# Patient Record
Sex: Female | Born: 1955
Health system: Southern US, Community
[De-identification: ages and names within clinical notes are randomized; demographics above are authoritative.]

## PROBLEM LIST (undated history)

## (undated) DIAGNOSIS — Z973 Presence of spectacles and contact lenses: Secondary | ICD-10-CM

## (undated) DIAGNOSIS — R232 Flushing: Secondary | ICD-10-CM

## (undated) DIAGNOSIS — R112 Nausea with vomiting, unspecified: Secondary | ICD-10-CM

## (undated) DIAGNOSIS — N926 Irregular menstruation, unspecified: Secondary | ICD-10-CM

## (undated) DIAGNOSIS — Z9221 Personal history of antineoplastic chemotherapy: Secondary | ICD-10-CM

## (undated) DIAGNOSIS — Z9889 Other specified postprocedural states: Secondary | ICD-10-CM

## (undated) DIAGNOSIS — T7840XA Allergy, unspecified, initial encounter: Secondary | ICD-10-CM

## (undated) DIAGNOSIS — C50919 Malignant neoplasm of unspecified site of unspecified female breast: Secondary | ICD-10-CM

## (undated) DIAGNOSIS — C50412 Malignant neoplasm of upper-outer quadrant of left female breast: Secondary | ICD-10-CM

## (undated) DIAGNOSIS — N83209 Unspecified ovarian cyst, unspecified side: Secondary | ICD-10-CM

## (undated) DIAGNOSIS — Z923 Personal history of irradiation: Secondary | ICD-10-CM

## (undated) HISTORY — PX: BREAST LUMPECTOMY: SHX2

## (undated) HISTORY — DX: Unspecified ovarian cyst, unspecified side: N83.209

## (undated) HISTORY — DX: Flushing: R23.2

## (undated) HISTORY — DX: Malignant neoplasm of upper-outer quadrant of left female breast: C50.412

## (undated) HISTORY — PX: DILATION AND CURETTAGE OF UTERUS: SHX78

## (undated) HISTORY — DX: Irregular menstruation, unspecified: N92.6

## (undated) HISTORY — PX: UPPER GI ENDOSCOPY: SHX6162

## (undated) HISTORY — PX: BREAST BIOPSY: SHX20

---

## 1988-10-29 HISTORY — PX: DIAGNOSTIC LAPAROSCOPY: SUR761

## 1997-12-08 ENCOUNTER — Ambulatory Visit (HOSPITAL_COMMUNITY): Admission: RE | Admit: 1997-12-08 | Discharge: 1997-12-08 | Payer: Self-pay | Admitting: Obstetrics and Gynecology

## 1999-08-31 ENCOUNTER — Other Ambulatory Visit: Admission: RE | Admit: 1999-08-31 | Discharge: 1999-08-31 | Payer: Self-pay | Admitting: *Deleted

## 1999-10-02 ENCOUNTER — Ambulatory Visit (HOSPITAL_COMMUNITY): Admission: RE | Admit: 1999-10-02 | Discharge: 1999-10-02 | Payer: Self-pay | Admitting: Obstetrics and Gynecology

## 1999-10-02 ENCOUNTER — Encounter: Payer: Self-pay | Admitting: Obstetrics and Gynecology

## 2000-12-13 ENCOUNTER — Other Ambulatory Visit: Admission: RE | Admit: 2000-12-13 | Discharge: 2000-12-13 | Payer: Self-pay | Admitting: Obstetrics and Gynecology

## 2001-01-03 ENCOUNTER — Ambulatory Visit (HOSPITAL_COMMUNITY): Admission: RE | Admit: 2001-01-03 | Discharge: 2001-01-03 | Payer: Self-pay | Admitting: Obstetrics and Gynecology

## 2001-01-03 ENCOUNTER — Encounter: Payer: Self-pay | Admitting: Obstetrics and Gynecology

## 2001-11-12 ENCOUNTER — Other Ambulatory Visit: Admission: RE | Admit: 2001-11-12 | Discharge: 2001-11-12 | Payer: Self-pay | Admitting: Obstetrics and Gynecology

## 2001-11-19 ENCOUNTER — Encounter: Admission: RE | Admit: 2001-11-19 | Discharge: 2001-11-19 | Payer: Self-pay | Admitting: Internal Medicine

## 2001-11-19 ENCOUNTER — Encounter: Payer: Self-pay | Admitting: Internal Medicine

## 2002-01-15 ENCOUNTER — Ambulatory Visit (HOSPITAL_COMMUNITY): Admission: RE | Admit: 2002-01-15 | Discharge: 2002-01-15 | Payer: Self-pay | Admitting: Obstetrics and Gynecology

## 2002-01-15 ENCOUNTER — Encounter: Payer: Self-pay | Admitting: Obstetrics and Gynecology

## 2003-01-18 ENCOUNTER — Encounter: Payer: Self-pay | Admitting: Obstetrics and Gynecology

## 2003-01-18 ENCOUNTER — Ambulatory Visit (HOSPITAL_COMMUNITY): Admission: RE | Admit: 2003-01-18 | Discharge: 2003-01-18 | Payer: Self-pay | Admitting: Obstetrics and Gynecology

## 2003-02-17 ENCOUNTER — Other Ambulatory Visit: Admission: RE | Admit: 2003-02-17 | Discharge: 2003-02-17 | Payer: Self-pay | Admitting: Obstetrics and Gynecology

## 2004-01-31 ENCOUNTER — Ambulatory Visit (HOSPITAL_COMMUNITY): Admission: RE | Admit: 2004-01-31 | Discharge: 2004-01-31 | Payer: Self-pay | Admitting: Obstetrics and Gynecology

## 2004-02-22 ENCOUNTER — Other Ambulatory Visit: Admission: RE | Admit: 2004-02-22 | Discharge: 2004-02-22 | Payer: Self-pay | Admitting: Obstetrics and Gynecology

## 2004-10-29 DIAGNOSIS — Z9889 Other specified postprocedural states: Secondary | ICD-10-CM

## 2004-10-29 DIAGNOSIS — R112 Nausea with vomiting, unspecified: Secondary | ICD-10-CM

## 2004-10-29 HISTORY — DX: Other specified postprocedural states: R11.2

## 2004-10-29 HISTORY — PX: OTHER SURGICAL HISTORY: SHX169

## 2004-10-29 HISTORY — DX: Other specified postprocedural states: Z98.890

## 2005-01-31 ENCOUNTER — Ambulatory Visit (HOSPITAL_COMMUNITY): Admission: RE | Admit: 2005-01-31 | Discharge: 2005-01-31 | Payer: Self-pay | Admitting: Obstetrics and Gynecology

## 2005-02-14 ENCOUNTER — Encounter: Admission: RE | Admit: 2005-02-14 | Discharge: 2005-02-14 | Payer: Self-pay | Admitting: Obstetrics and Gynecology

## 2005-06-11 ENCOUNTER — Other Ambulatory Visit: Admission: RE | Admit: 2005-06-11 | Discharge: 2005-06-11 | Payer: Self-pay | Admitting: Obstetrics and Gynecology

## 2005-07-09 ENCOUNTER — Ambulatory Visit (HOSPITAL_COMMUNITY): Admission: RE | Admit: 2005-07-09 | Discharge: 2005-07-09 | Payer: Self-pay | Admitting: Obstetrics and Gynecology

## 2005-07-09 ENCOUNTER — Encounter (INDEPENDENT_AMBULATORY_CARE_PROVIDER_SITE_OTHER): Payer: Self-pay | Admitting: Specialist

## 2006-03-07 ENCOUNTER — Ambulatory Visit (HOSPITAL_COMMUNITY): Admission: RE | Admit: 2006-03-07 | Discharge: 2006-03-07 | Payer: Self-pay | Admitting: Obstetrics and Gynecology

## 2007-03-19 ENCOUNTER — Ambulatory Visit (HOSPITAL_COMMUNITY): Admission: RE | Admit: 2007-03-19 | Discharge: 2007-03-19 | Payer: Self-pay | Admitting: Obstetrics and Gynecology

## 2008-03-31 ENCOUNTER — Ambulatory Visit (HOSPITAL_COMMUNITY): Admission: RE | Admit: 2008-03-31 | Discharge: 2008-03-31 | Payer: Self-pay | Admitting: Obstetrics and Gynecology

## 2008-10-29 HISTORY — PX: COLONOSCOPY: SHX174

## 2009-04-04 ENCOUNTER — Ambulatory Visit (HOSPITAL_COMMUNITY): Admission: RE | Admit: 2009-04-04 | Discharge: 2009-04-04 | Payer: Self-pay | Admitting: Obstetrics and Gynecology

## 2010-04-12 ENCOUNTER — Ambulatory Visit (HOSPITAL_COMMUNITY): Admission: RE | Admit: 2010-04-12 | Discharge: 2010-04-12 | Payer: Self-pay | Admitting: Obstetrics and Gynecology

## 2011-03-16 NOTE — Op Note (Signed)
NAMEALIECE, HONOLD                ACCOUNT NO.:  [id number]   MEDICAL RECORD NO.:  [id number]          PATIENT TYPE:  AMB   LOCATION:  [facility]                           FACILITY:  [facility]   PHYSICIAN:  Hal Morales, M.D.DATE OF BIRTH:  15-Jan-1956   DATE OF PROCEDURE:  07/09/2005  DATE OF DISCHARGE:                                 OPERATIVE REPORT   PREOPERATIVE DIAGNOSES:  1.  Menorrhagia.  2.  Uterine fibroids.   POSTOPERATIVE DIAGNOSES:  1.  Menorrhagia.  2.  Uterine fibroids.   OPERATION:  Hysteroscopy, dilatation and curettage, NovaSure endometrial  ablation.   SURGEON:  Hal Morales, M.D.   ANESTHESIA:  General orotracheal.   ESTIMATED BLOOD LOSS:  Less than 50 mL.   COMPLICATIONS:  None.   FINDINGS:  The uterus sounded to 10 cm with a cervical length of 2.8 cm.  There were no submucosal or intracavitary uterine fibroids or other  endometrial lesions.  A small amount of curettings were obtained at the time  of curettage.  The endometrial cavity was primarily atrophic.   PROCEDURE:  The patient was taken to the operating room after appropriate  identification and placed on the operating table.  After the attainment of  adequate general anesthesia, she was placed in the modified lithotomy  position.  The perineum and vagina were prepped with multiple layers of  Betadine and a red Robinson catheter used to empty the bladder.  The  perineum was draped as a sterile field.  A Graves speculum was placed in the  vagina and a single-tooth tenaculum placed on the anterior surface.  The  cervical length was determined with a Hegar dilator.  The uterus was sounded  for the total sounding length.  The cervix was already dilated enough to  accommodate the diagnostic hysteroscope, and this was used to document the  aforementioned findings.  The hysteroscope was then removed and the cervix  also noted to be dilated enough for placement of a NovaSure apparatus.  After setting the  cavity length at 6.5+, the NovaSure apparatus was placed  into the uterus to the fundus and withdrawn approximately 1 cm.  The array  was then opened and initially there was difficulty with seating the  instrument.  The array was replaced within the apparatus and removed from  within the endometrial cavity.  It was then placed again into the  endometrial cavity to the fundus and withdrawn 1 cm.  The array was opened  and noted to be a 2.5 cm.  After seating, the array increased in width to  4.5 cm.  At that time because of the patulous cervix, a gauze saturated with  Vaseline was wrapped around the collar of the NovaSure apparatus to improve  the cervical seal.  At that time the assessment of endometrial cavity  integrity was undertaken and indications were that there was no breach of  the integrity of the cavity.  The ablation was begun and completed in 48  seconds at a power of 161.  The array was then retracted into the apparatus  and the  apparatus removed from the endometrial cavity.  The hysteroscope was  inserted and the ablated endometrium visualized.  All instruments were then  removed from the vagina and the patient awakened from general anesthesia and  taken to the recovery room in satisfactory condition having tolerated the  procedure well, with sponge and instrument correct.   SPECIMEN TO PATHOLOGY:  Endometrial curettings.      Hal Morales, M.D.  Electronically Signed     VPH/MEDQ  D:  07/09/2005  T:  07/10/2005  Job:  [id number]

## 2011-03-16 NOTE — H&P (Signed)
[name], Julie [name]                ACCOUNT NO.:  [id number]   MEDICAL RECORD NO.:  [id number]          PATIENT TYPE:  AMB   LOCATION:  [facility]C                           FACILITY:  [facility]   PHYSICIAN:  Hal Morales, M.D.DATE OF BIRTH:  1955-12-31   DATE OF ADMISSION:  DATE OF DISCHARGE:                                HISTORY & PHYSICAL   HISTORY OF PRESENT ILLNESS:  The patient is a 55 year old white married  female, para 2-0-1-2, who presents for endometrial ablation for symptomatic  menorrhagia and possible resection of submucosal fibroid.  The patient has  always had irregular menses.  In approximately 2000 she began to have very  heavy menses which were bothersome and likewise irregular.  At that time her  menses were controlled with Loestrin 1.5/30 with regular withdrawal bleeding  and no intermenstrual bleeding.  Her menses at that time were quite light.  In 2003 she discontinued her Loestrin because of hypertension.  She began  almost immediately to have again irregular menses with intermenstrual  spotting and after a workup which included a normal thyroid stimulating  hormone, Prolactin, GC and Chlamydia cultures, and a normal sonohysterogram,  she was started on Prometrium cyclically, using 200 mg nightly, days 16-25  of each month.  She had fairly regular withdrawal bleeding with the  Prometrium; however, wanted to restart the Loestrin on a continuous basis to  minimize the number of menstrual periods she experienced each year.  She  thus restarted her Loestrin in late 2003, taking it continuously for three  months, allowing only four menses per year.  She continued with her  hydrochlorothiazide.  She discontinued her oral contraceptive pills in  December of 2005 because of significant decreased libido.  Shortly  thereafter she began again having irregular menses and most recently has had  a workup including TSH, Prolactin, and endometrial biopsy, all of which were  within  normal limits.  An ultrasound performed on June 11, 2005 showed two  small uterine fibroids, both less than 2 cm.  It was not clear that these  were submucosal fibroids at all.  She likewise underwent a GC and Chlamydia  culture with the GC culture initially returning with a positive which the  patient was adamant was a false positive.  She subsequently declined  treatment and was retested for both gonorrhea and Chlamydia, both of which  were negative.  Her husband was tested simultaneously for gonorrhea and  Chlamydia and these tests were likewise negative.  She felt certain that  this represented a previously false positive gonorrhea culture and declined  any further treatment.  She now presents having menses that last as much as  two weeks at a time with no significant intermenstrual bleeding.  She has  difficulty with soiling her clothes, soiling her bed linens, and wants to  have therapy for her menorrhagia.  A discussion had been held with the  patient concerning options for management of her menorrhagia and given that  she currently has hypertension and experiences decreased libido on oral  contraceptive pills, was unwilling to consider any hormonal  management of  her menorrhagia.  She likewise declined intrauterine contraceptive device.  She thus opted for endometrial ablation.   PAST OBSTETRICAL HISTORY:  The patient had an elective termination of  pregnancy approximately 30 years ago.  She had a primary low transverse  cesarean section in 1991 for delivery of a female infant who was footling  breech, weighing 8 pounds 8 ounces.  In 1994 she had a vaginal birth after  cesarean section, delivering a 9 pound 1 ounce female infant.  She uses  vasectomy for contraception.   GYN HISTORY:  Menarche, age 72, with menses ranging from every 28 to every  44 days.  The flow is typically heavy and lasted as much as seven days prior  to the recent onset of menorrhagia in 2000.  Pap smears  have always been  normal.  Her last Pap smear was done June 11, 2005 and the result of that  is pending.  The patient did have an ovarian cyst and underwent exploratory  laparotomy in 1990.   PAST SURGICAL HISTORY:  1990, exploratory laparotomy.   PAST MEDICAL HISTORY:  Chronic hypertension, diagnosed in 2003, currently  treated with hydrochlorothiazide.   FAMILY HISTORY:  Positive for breast cancer in a maternal grandmother, heart  disease in maternal grandparents and paternal grandparents.   CURRENT MEDICATIONS:  1.  Hydrochlorothiazide 25 mg a day.  2.  Alavert.  3.  Albuterol inhaler.  4.  Metrogel.  5.  Advil  p.r.n. headaches or cramping.   DRUG SENSITIVITIES:  1.  PENICILLIN.  2.  SULFA.   REVIEW OF SYSTEMS:  Significant for the aforementioned heavy menses.  The  patient specifically denies any light-headedness or presyncope.  She has no  history of anemia.  She has had difficulty losing weight.  She has had  decreased libido in the past, but that improved with discontinuation of  birth control pills in December of 2005.   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   PHYSICAL EXAMINATION:  GENERAL:  The patient is an obese white female in no  acute distress.  VITAL SIGNS:  Weight is 210 pounds, height is 5 feet 2-1/2 inches.  Blood  pressure is 140/80.  HEART:  Regular rate and rhythm.  LUNGS:  Clear.  ABDOMEN:  Soft without masses or organomegaly or tenderness.  PELVIC:  EGBUS within normal limits.  The vagina is rugous.  The cervix is  without gross lesions.  The uterus does not feel enlarged, though increased  BMI limits examination.  Adnexa:  No palpable masses.  Rectovaginal is  deferred.   IMPRESSION:  1.  Menorrhagia.  2.  Small uterine fibroids on ultrasound.  3.  History of irregular menses with a normal endometrial biopsy on June 04, 2005, as well as normal TSH  and Prolactin.   DISPOSITION:  A discussion is held with the patient concerning options for  management of her menorrhagia.  She has decided to proceed with NovaSure  endometrial ablation.  She will undergo hysteroscopy and resection of any  submucosal myomata prior to the NovaSure ablation.  She wants to proceed  after having discussed the risks of anesthesia, bleeding, infection, damage  to adjacent organs, uterine perforation, and the possibility of decreased  efficacy of the endometrial ablation if submucosal fibroids are present.  She also understands that her history of irregular menses may decrease the  likelihood that she will have either amenorrhea or completely regular  menses.  She wishes to proceed and this will be performed at [facility] on July 09, 2005.      Hal Morales, M.D.  Electronically Signed     VPH/MEDQ  D:  07/08/2005  T:  07/08/2005  Job:  [id number]

## 2011-05-07 ENCOUNTER — Other Ambulatory Visit (HOSPITAL_COMMUNITY): Payer: Self-pay | Admitting: Obstetrics and Gynecology

## 2011-05-07 DIAGNOSIS — Z1231 Encounter for screening mammogram for malignant neoplasm of breast: Secondary | ICD-10-CM

## 2011-05-17 ENCOUNTER — Ambulatory Visit (HOSPITAL_COMMUNITY)
Admission: RE | Admit: 2011-05-17 | Discharge: 2011-05-17 | Disposition: A | Discharge status: Home / Self Care | Payer: Commercial Managed Care - PPO | Source: Ambulatory Visit | Attending: Obstetrics and Gynecology | Admitting: Obstetrics and Gynecology

## 2011-05-17 DIAGNOSIS — Z1231 Encounter for screening mammogram for malignant neoplasm of breast: Secondary | ICD-10-CM | POA: Insufficient documentation

## 2012-07-17 ENCOUNTER — Other Ambulatory Visit: Payer: Self-pay | Admitting: Obstetrics and Gynecology

## 2012-07-17 DIAGNOSIS — Z1231 Encounter for screening mammogram for malignant neoplasm of breast: Secondary | ICD-10-CM

## 2012-07-22 ENCOUNTER — Ambulatory Visit (HOSPITAL_COMMUNITY)
Admission: RE | Admit: 2012-07-22 | Discharge: 2012-07-22 | Disposition: A | Discharge status: Home / Self Care | Payer: 59 | Source: Ambulatory Visit | Attending: Obstetrics and Gynecology | Admitting: Obstetrics and Gynecology

## 2012-07-22 DIAGNOSIS — Z1231 Encounter for screening mammogram for malignant neoplasm of breast: Secondary | ICD-10-CM | POA: Insufficient documentation

## 2013-07-02 ENCOUNTER — Other Ambulatory Visit: Payer: Self-pay | Admitting: Obstetrics and Gynecology

## 2013-07-02 DIAGNOSIS — Z1231 Encounter for screening mammogram for malignant neoplasm of breast: Secondary | ICD-10-CM

## 2013-07-23 ENCOUNTER — Ambulatory Visit (HOSPITAL_COMMUNITY)
Admission: RE | Admit: 2013-07-23 | Discharge: 2013-07-23 | Disposition: A | Discharge status: Home / Self Care | Payer: 59 | Source: Ambulatory Visit | Attending: Obstetrics and Gynecology | Admitting: Obstetrics and Gynecology

## 2013-07-23 ENCOUNTER — Other Ambulatory Visit: Payer: Self-pay | Admitting: Obstetrics and Gynecology

## 2013-07-23 DIAGNOSIS — Z1231 Encounter for screening mammogram for malignant neoplasm of breast: Secondary | ICD-10-CM | POA: Insufficient documentation

## 2013-10-29 DIAGNOSIS — R232 Flushing: Secondary | ICD-10-CM

## 2013-10-29 HISTORY — DX: Flushing: R23.2

## 2014-06-29 ENCOUNTER — Other Ambulatory Visit: Payer: Self-pay | Admitting: Obstetrics and Gynecology

## 2014-06-29 DIAGNOSIS — Z1231 Encounter for screening mammogram for malignant neoplasm of breast: Secondary | ICD-10-CM

## 2014-07-28 ENCOUNTER — Other Ambulatory Visit: Payer: Self-pay | Admitting: Obstetrics and Gynecology

## 2014-07-28 ENCOUNTER — Ambulatory Visit (HOSPITAL_COMMUNITY)
Admission: RE | Admit: 2014-07-28 | Discharge: 2014-07-28 | Disposition: A | Discharge status: Home / Self Care | Payer: 59 | Source: Ambulatory Visit | Attending: Obstetrics and Gynecology | Admitting: Obstetrics and Gynecology

## 2014-07-28 DIAGNOSIS — Z1231 Encounter for screening mammogram for malignant neoplasm of breast: Secondary | ICD-10-CM | POA: Insufficient documentation

## 2014-07-28 DIAGNOSIS — R928 Other abnormal and inconclusive findings on diagnostic imaging of breast: Secondary | ICD-10-CM

## 2014-08-02 ENCOUNTER — Other Ambulatory Visit: Payer: Self-pay | Admitting: Obstetrics and Gynecology

## 2014-08-02 DIAGNOSIS — R928 Other abnormal and inconclusive findings on diagnostic imaging of breast: Secondary | ICD-10-CM

## 2014-08-05 ENCOUNTER — Ambulatory Visit
Admission: RE | Admit: 2014-08-05 | Discharge: 2014-08-05 | Disposition: A | Discharge status: Home / Self Care | Payer: 59 | Source: Ambulatory Visit | Attending: Obstetrics and Gynecology | Admitting: Obstetrics and Gynecology

## 2014-08-05 ENCOUNTER — Other Ambulatory Visit: Payer: Self-pay | Admitting: Obstetrics and Gynecology

## 2014-08-05 DIAGNOSIS — R928 Other abnormal and inconclusive findings on diagnostic imaging of breast: Secondary | ICD-10-CM

## 2014-08-05 DIAGNOSIS — C50919 Malignant neoplasm of unspecified site of unspecified female breast: Secondary | ICD-10-CM

## 2014-08-05 HISTORY — DX: Malignant neoplasm of unspecified site of unspecified female breast: C50.919

## 2014-08-06 ENCOUNTER — Other Ambulatory Visit: Payer: Self-pay | Admitting: Diagnostic Radiology

## 2014-08-09 ENCOUNTER — Telehealth: Payer: Self-pay | Admitting: *Deleted

## 2014-08-09 DIAGNOSIS — C50412 Malignant neoplasm of upper-outer quadrant of left female breast: Secondary | ICD-10-CM

## 2014-08-09 NOTE — Telephone Encounter (Signed)
Confirmed [facility] for 08/11/14 at 0830.  Instructions and contact information given.

## 2014-08-11 ENCOUNTER — Ambulatory Visit
Admission: RE | Admit: 2014-08-11 | Discharge: 2014-08-11 | Disposition: A | Discharge status: Home / Self Care | Payer: 59 | Source: Ambulatory Visit | Attending: Radiation Oncology | Admitting: Radiation Oncology

## 2014-08-11 ENCOUNTER — Ambulatory Visit: Payer: 59 | Attending: General Surgery | Admitting: Physical Therapy

## 2014-08-11 ENCOUNTER — Encounter: Payer: Self-pay | Admitting: Hematology and Oncology

## 2014-08-11 ENCOUNTER — Other Ambulatory Visit (INDEPENDENT_AMBULATORY_CARE_PROVIDER_SITE_OTHER): Payer: Self-pay | Admitting: General Surgery

## 2014-08-11 ENCOUNTER — Ambulatory Visit: Payer: 59

## 2014-08-11 ENCOUNTER — Telehealth: Payer: Self-pay | Admitting: *Deleted

## 2014-08-11 ENCOUNTER — Other Ambulatory Visit (HOSPITAL_BASED_OUTPATIENT_CLINIC_OR_DEPARTMENT_OTHER): Payer: 59

## 2014-08-11 ENCOUNTER — Encounter (INDEPENDENT_AMBULATORY_CARE_PROVIDER_SITE_OTHER): Payer: Self-pay

## 2014-08-11 ENCOUNTER — Encounter: Payer: Self-pay | Admitting: Dietician

## 2014-08-11 ENCOUNTER — Ambulatory Visit (HOSPITAL_BASED_OUTPATIENT_CLINIC_OR_DEPARTMENT_OTHER): Payer: 59 | Admitting: Hematology and Oncology

## 2014-08-11 ENCOUNTER — Telehealth: Payer: Self-pay | Admitting: Hematology and Oncology

## 2014-08-11 VITALS — BP 145/86 | HR 74 | Temp 97.8°F | Resp 20 | Ht 62.0 in | Wt 199.2 lb

## 2014-08-11 DIAGNOSIS — R293 Abnormal posture: Secondary | ICD-10-CM | POA: Insufficient documentation

## 2014-08-11 DIAGNOSIS — C50212 Malignant neoplasm of upper-inner quadrant of left female breast: Secondary | ICD-10-CM

## 2014-08-11 DIAGNOSIS — C50919 Malignant neoplasm of unspecified site of unspecified female breast: Secondary | ICD-10-CM | POA: Insufficient documentation

## 2014-08-11 DIAGNOSIS — Z5189 Encounter for other specified aftercare: Secondary | ICD-10-CM | POA: Diagnosis not present

## 2014-08-11 DIAGNOSIS — C50412 Malignant neoplasm of upper-outer quadrant of left female breast: Secondary | ICD-10-CM

## 2014-08-11 DIAGNOSIS — Z170 Estrogen receptor positive status [ER+]: Secondary | ICD-10-CM

## 2014-08-11 LAB — COMPREHENSIVE METABOLIC PANEL (CC13)
ALT: 19 U/L (ref 0–55)
AST: 17 U/L (ref 5–34)
Albumin: 3.6 g/dL (ref 3.5–5.0)
Alkaline Phosphatase: 113 U/L (ref 40–150)
Anion Gap: 9 mEq/L (ref 3–11)
BUN: 14.5 mg/dL (ref 7.0–26.0)
CALCIUM: 9.8 mg/dL (ref 8.4–10.4)
CO2: 23 meq/L (ref 22–29)
Chloride: 111 mEq/L — ABNORMAL HIGH (ref 98–109)
Creatinine: 0.9 mg/dL (ref 0.6–1.1)
Glucose: 108 mg/dl (ref 70–140)
Potassium: 3.9 mEq/L (ref 3.5–5.1)
Sodium: 143 mEq/L (ref 136–145)
Total Bilirubin: 0.39 mg/dL (ref 0.20–1.20)
Total Protein: 7.6 g/dL (ref 6.4–8.3)

## 2014-08-11 LAB — CBC WITH DIFFERENTIAL/PLATELET
BASO%: 0.8 % (ref 0.0–2.0)
Basophils Absolute: 0.1 10*3/uL (ref 0.0–0.1)
EOS%: 5.1 % (ref 0.0–7.0)
Eosinophils Absolute: 0.4 10*3/uL (ref 0.0–0.5)
HEMATOCRIT: 43.9 % (ref 34.8–46.6)
HGB: 14.5 g/dL (ref 11.6–15.9)
LYMPH#: 1.9 10*3/uL (ref 0.9–3.3)
LYMPH%: 24.8 % (ref 14.0–49.7)
MCH: 29.4 pg (ref 25.1–34.0)
MCHC: 33 g/dL (ref 31.5–36.0)
MCV: 89.2 fL (ref 79.5–101.0)
MONO#: 0.7 10*3/uL (ref 0.1–0.9)
MONO%: 9.3 % (ref 0.0–14.0)
NEUT%: 60 % (ref 38.4–76.8)
NEUTROS ABS: 4.7 10*3/uL (ref 1.5–6.5)
Platelets: 258 10*3/uL (ref 145–400)
RBC: 4.92 10*6/uL (ref 3.70–5.45)
RDW: 13.3 % (ref 11.2–14.5)
WBC: 7.8 10*3/uL (ref 3.9–10.3)

## 2014-08-11 NOTE — Telephone Encounter (Signed)
Per staff phone call and POF I have schedueld appts. Scheduler advised of appts.  JMW  

## 2014-08-11 NOTE — Progress Notes (Signed)
[facility] CONSULT NOTE  Patient Care Team: [name], MD as PCP - General (Internal Medicine)  CHIEF COMPLAINTS/PURPOSE OF CONSULTATION:  Newly diagnosed breast cancer  HISTORY OF PRESENTING ILLNESS:  Julie [name] 58 y.o. female is here because of recent diagnosis of left breast cancer. She had annual screening mammograms and after the recent screening mammogram on 08/05/2014 she was noted to have a left breast mass which was then palpable at 10:00 position it was 2.3 x 2 x 1.4 cm it was lobular and hypoechoic. This was biopsied on 08/05/2014 which came back as grade 3 invasive ductal carcinoma ER/PR negative HER-2 positive with a Ki-67 of 90%. She was presented this morning at the breast multidisciplinary tumor board and we're discussing her multidisciplinary care plan. Other than the soreness from recent biopsy she has no new complaints or concerns.  I reviewed her records extensively and collaborated the history with the patient.  SUMMARY OF ONCOLOGIC HISTORY:   Breast cancer of upper-outer quadrant of left female breast   08/05/2014 Mammogram Left breast 10 o clock: 2.3X 2X1.4 cm lobular hypoechoeic   08/05/2014 Initial Biopsy Grade 3 IDC Er 0%, PR 0%, Her 2 positive Ratio 3.74; Ki 67: 90%    In terms of breast cancer risk profile:  She menarched at early age of 53 and went to menopause at age unknown, ablation 2006 last period  She had 2 pregnancy, her first child was born at age 48  She has received birth control pills for approximately 10-15 years.  She was never exposed to fertility medications or hormone replacement therapy.  She has  family history of Breast/GYN/GI cancer; maternal grandmother age 106 had breast cancer  MEDICAL HISTORY:  Past Medical History  Diagnosis Date  . Uterine cyst   . Ovarian cyst   . Irregular menstrual cycle   . Hot flashes     SURGICAL HISTORY: Past Surgical History  Procedure Laterality Date  . Uterine ablation  2006   . Cesarean section  1991    SOCIAL HISTORY: History   Social History  . Marital Status: Married    Spouse Name: N/A    Number of Children: N/A  . Years of Education: N/A   Occupational History  . Not on file.   Social History Main Topics  . Smoking status: Never Smoker   . Smokeless tobacco: Never Used  . Alcohol Use: Yes     Comment: 4-5/week  . Drug Use: No  . Sexual Activity: Not on file   Other Topics Concern  . Not on file   Social History Narrative  . No narrative on file    FAMILY HISTORY: Family History  Problem Relation Age of Onset  . Breast cancer Maternal Grandmother     ALLERGIES:  is allergic to codeine and sulfa antibiotics.  MEDICATIONS:  Current Outpatient Prescriptions  Medication Sig Dispense Refill  . buPROPion (WELLBUTRIN SR) 150 MG 12 hr tablet Take 150 mg by mouth 2 (two) times daily.      . cetirizine (ZYRTEC) 10 MG tablet Take 10 mg by mouth daily.      . fluticasone (FLONASE) 50 MCG/ACT nasal spray Place into both nostrils as needed for allergies or rhinitis.      Marland Kitchen simvastatin (ZOCOR) 20 MG tablet Take 20 mg by mouth daily.       No current facility-administered medications for this visit.    REVIEW OF SYSTEMS:   Constitutional: Denies fevers, chills or abnormal  night sweats Eyes: Denies blurriness of vision, double vision or watery eyes Ears, nose, mouth, throat, and face: Denies mucositis or sore throat Respiratory: Denies cough, dyspnea or wheezes Cardiovascular: Denies palpitation, chest discomfort or lower extremity swelling Gastrointestinal:  Denies nausea, heartburn or change in bowel habits Skin: Denies abnormal skin rashes Lymphatics: Denies new lymphadenopathy or easy bruising Neurological:Denies numbness, tingling or new weaknesses Behavioral/Psych: Mood is stable, no new changes  Breast: Palpable lump in the left breast medial aspect upper All other systems were reviewed with the patient and are  negative.  PHYSICAL EXAMINATION: ECOG PERFORMANCE STATUS: 0 - Asymptomatic  Filed Vitals:   08/11/14 0903  BP: 145/86  Pulse: 74  Temp: 97.8 F (36.6 C)  Resp: 20   Filed Weights   08/11/14 0903  Weight: 199 lb 3.2 oz (90.357 kg)    GENERAL:alert, no distress and comfortable SKIN: skin color, texture, turgor are normal, no rashes or significant lesions EYES: normal, conjunctiva are pink and non-injected, sclera clear OROPHARYNX:no exudate, no erythema and lips, buccal mucosa, and tongue normal  NECK: supple, thyroid normal size, non-tender, without nodularity LYMPH:  no palpable lymphadenopathy in the cervical, axillary or inguinal LUNGS: clear to auscultation and percussion with normal breathing effort HEART: regular rate & rhythm and no murmurs and no lower extremity edema ABDOMEN:abdomen soft, non-tender and normal bowel sounds Musculoskeletal:no cyanosis of digits and no clubbing  PSYCH: alert & oriented x 3 with fluent speech NEURO: no focal motor/sensory deficits BREAST: Palpable lump in the left breast medial aspect upper. No palpable axillary or supraclavicular lymphadenopathy  LABORATORY DATA:  I have reviewed the data as listed Lab Results  Component Value Date   WBC 7.8 08/11/2014   HGB 14.5 08/11/2014   HCT 43.9 08/11/2014   MCV 89.2 08/11/2014   PLT 258 08/11/2014   Lab Results  Component Value Date   NA 143 08/11/2014   K 3.9 08/11/2014   CO2 23 08/11/2014    RADIOGRAPHIC STUDIES: I have personally reviewed the radiological reports and agreed with the findings in the report.  ASSESSMENT AND PLAN:  Breast cancer of upper-outer quadrant of left female breast Left breast invasive ductal carcinoma grade 3 ER 0% PR 0% HER-2 positive ratio 3.74, Ki-67 90%: 2.3 cm mass by ultrasound clinical stage: T2, N0, M0 stage II A.  Discussed with the patient, the details of pathology including the type of breast cancer,the clinical staging, the significance of ER,  PR and HER-2/neu receptors and the implications for treatment. After reviewing the pathology in detail, we proceeded to discuss the different treatment options between surgery, radiation, chemotherapy, antiestrogen therapies.  Recommendation: I recommended neoadjuvant chemotherapy with Taxotere, carboplatin, Herceptin, Perjeta given once every 3 weeks x6 cycles followed by Herceptin maintenance for one year.  Chemotherapy counseling:I have discussed the risks and benefits of chemotherapy including the risks of nausea/ vomiting, risk of infection from low WBC count, fatigue due to chemo or anemia, bruising or bleeding due to low platelets, mouth sores, loss/ change in taste and decreased appetite. Liver and kidney function will be monitored through out chemotherapy as abnormalities in liver and kidney function may be a side effect of treatment. Cardiac dysfunction due to Herceptin and Perjeta were discussed in detail. Chemotherapy counseling was provided and all the questions were answered. Patient will be set up for chemotherapy education class, echocardiogram, port placement and genetic counseling.  Patient reports that she has a very low tolerance for drugs and hence we might have to  use Emend and Aloxi for nausea prevention related to chemotherapy.    All questions were answered. The patient knows to call the clinic with any problems, questions or concerns. I spent 40 minutes counseling the patient face to face. The total time spent in the appointment was 60 minutes and more than 50% was on counseling.     Rulon Eisenmenger, MD 08/11/2014 10:24 AM

## 2014-08-11 NOTE — Progress Notes (Unsigned)
Patient was seen by RD during [facility] on 08/11/2014  Provided pt with folder of educational materials regarding general nutrition recommendations for breast cancer patients, plant-based diets, antioxidants, cancer facts vs myths, and information on organic foods  Explained importance of healthy nutrition during treatments and encouraged pt to consume daily recommended amount of fruits and vegetables, emphasizing variety of intake for maximum antioxidant and synergistic health benefits. Promoted adequate fiber intake, with use of whole grain and whole wheat products, beans, and lentils. Encouraged patient to follow a low fat diet with use of heart healthy fats, and to opt for plant-based proteins weekly  Recommended pt maintain healthy weight during treatments, and encouraged gradual weight loss as warranted after procedures.  Diet recall indicated pt consuming whole wheat w/avocados and berries or oatmeal for breakfast, leftover dinner for lunch, and assorted foods for dinner: pork, fish, vegetables, eggplants or squash. Has been increasing intake of dark leafy green vegetables- kale, spinach, swiss char  Patient had questions regarding different plant based proteins. Reviewed quinoa and various preparatory methods to increase its palatability.  Expect good compliance. Pt has been working with Physiological scientist, incorporating local foods, and utilizing produce from their personal garden. Discussed alternatives meatless meals for pt to incorporate into diet.  Provided pt with outpatient oncology RD contact information. Encouraged pt to contact RD with additional follow up questions or nutrition-related concerns.  Atlee Abide MS RD LDN Clinical Dietitian [phone]

## 2014-08-11 NOTE — Progress Notes (Signed)
Checked in new pt with no financial concerns at this time. I informed pt of the [employer] and gave her a flyer on what they assist with. Made her aware of different foundations that offer copay assistance if needed and if chemo is part of her treatment plan if Josem Kaufmann is required I will obtain it. She has my card for any questions or concerns she may have in the future.

## 2014-08-11 NOTE — Progress Notes (Signed)
MD note created during office visit - copy to pt.  Original to scan.

## 2014-08-11 NOTE — Progress Notes (Signed)
  Radiation Oncology         [phone]) [phone] ________________________________  Initial outpatient Consultation - Date: 08/11/2014   Name: Julie [name] MRN: [id number]   DOB: [birth year]  REFERRING PHYSICIAN: [name], MD  STAGE: Breast cancer of upper-outer quadrant of left female breast   Primary site: Breast (Left)   Staging method: AJCC 7th Edition   Clinical: Stage IIA (T2, N0, cM0)   Summary: Stage IIA (T2, N0, cM0)   Clinical comments: Staged at breast conference 08/11/14.  HISTORY OF PRESENT ILLNESS::Julie [name] is a 58 y.o. female  Found to have a left breast mass on screening mammogram. On exam a palpable mass was noted in the medial aspect of the breast. Ultrasound confirmed this finding as 2.3 cm. A biopsy showed Grade 3 invasive ductal carcinoma ER-PR-Her2+Ki67 90%. She is accompanied by her husband. She is GXP2 with her first birth at 49 and is post menopausal. She htook HRT for 6 months and recently stopped. She is interested in breast conservation. She has done well since her biopsy. She had no breast related complaints prior to surgery.   PREVIOUS RADIATION THERAPY: No  FAMILY HISTORY:  Family History  Problem Relation Age of Onset  . Breast cancer Maternal Grandmother     SOCIAL HISTORY:  History  Substance Use Topics  . Smoking status: Never Smoker   . Smokeless tobacco: Never Used  . Alcohol Use: Yes     Comment: 4-5/week    REVIEW OF SYSTEMS:  A 15 point review of systems is documented in the electronic medical record. This was obtained by the nursing staff. However, I reviewed this with the patient to discuss relevant findings and make appropriate changes.  Pertinent positives are included in the chart.   PHYSICAL EXAM: Alert and oriented x 3. Pleasant. No distress. Very ticklish per patient to exam. No palpable masses in the right breast. Bruising over the left breast with a palpable mass in the upper medial portion of the left breast. No palpable  adenopathy in bilateral axilla or supraclavicular lymph nodes. 5/5 strength bilaterally.   IMPRESSION: T2N0 Invasive left breast cancer  PLAN:I spoke to the patient today regarding her diagnosis and options for treatment. We discussed the equivalence in terms of survival and local failure between mastectomy and breast conservation. We discussed the role of radiation in decreasing local failures in patients who undergo mastectomy and have positive lymph nodes or tumors greater than 5 cm. We discussed the role of radiation and decreasing local failures in patients who undergo lumpectomy.We discussed the possible side effects including but not limited to asymptomatic rib, heart and lung damage, heart disease, skin redness, fatigue, permanent skin darkening, and chest wall swelling. We discussed increased complications that can occur with reconstruction after radiation. We discussed the process of simulation and the placement tattoos. We discussed the low likelihood of secondary malignancies.   I spent 60 minutes  face to face with the patient and more than 50% of that time was spent in counseling and/or coordination of care.   ------------------------------------------------  Thea Silversmith, MD

## 2014-08-11 NOTE — Assessment & Plan Note (Signed)
Left breast invasive ductal carcinoma grade 3 ER 0% PR 0% HER-2 positive ratio 3.74, Ki-67 90%: 2.3 cm mass by ultrasound clinical stage: T2, N0, M0 stage II A.  Discussed with the patient, the details of pathology including the type of breast cancer,the clinical staging, the significance of ER, PR and HER-2/neu receptors and the implications for treatment. After reviewing the pathology in detail, we proceeded to discuss the different treatment options between surgery, radiation, chemotherapy, antiestrogen therapies.  Recommendation: I recommended neoadjuvant chemotherapy with Taxotere, carboplatin, Herceptin, Perjeta given once every 3 weeks x6 cycles followed by Herceptin maintenance for one year.  Chemotherapy counseling:I have discussed the risks and benefits of chemotherapy including the risks of nausea/ vomiting, risk of infection from low WBC count, fatigue due to chemo or anemia, bruising or bleeding due to low platelets, mouth sores, loss/ change in taste and decreased appetite. Liver and kidney function will be monitored through out chemotherapy as abnormalities in liver and kidney function may be a side effect of treatment. Cardiac dysfunction due to Herceptin and Perjeta were discussed in detail. Chemotherapy counseling was provided and all the questions were answered. Patient will be set up for chemotherapy education class, echocardiogram, port placement and genetic counseling.  Patient reports that she has a very low tolerance for drugs and hence we might have to use Emend and Aloxi for nausea prevention related to chemotherapy.

## 2014-08-12 ENCOUNTER — Telehealth: Payer: Self-pay | Admitting: Hematology and Oncology

## 2014-08-12 ENCOUNTER — Ambulatory Visit (HOSPITAL_BASED_OUTPATIENT_CLINIC_OR_DEPARTMENT_OTHER): Payer: 59 | Admitting: Genetic Counselor

## 2014-08-12 ENCOUNTER — Other Ambulatory Visit: Payer: 59

## 2014-08-12 ENCOUNTER — Other Ambulatory Visit: Payer: Self-pay

## 2014-08-12 ENCOUNTER — Encounter: Payer: Self-pay | Admitting: Genetic Counselor

## 2014-08-12 DIAGNOSIS — C50412 Malignant neoplasm of upper-outer quadrant of left female breast: Secondary | ICD-10-CM

## 2014-08-12 DIAGNOSIS — Z315 Encounter for genetic counseling: Secondary | ICD-10-CM

## 2014-08-12 DIAGNOSIS — C50212 Malignant neoplasm of upper-inner quadrant of left female breast: Secondary | ICD-10-CM

## 2014-08-12 DIAGNOSIS — Z803 Family history of malignant neoplasm of breast: Secondary | ICD-10-CM

## 2014-08-12 DIAGNOSIS — Z808 Family history of malignant neoplasm of other organs or systems: Secondary | ICD-10-CM

## 2014-08-12 NOTE — Telephone Encounter (Signed)
, °

## 2014-08-12 NOTE — Progress Notes (Signed)
Patient Name: Julie [name] Patient Age: 58 y.o. Encounter Date: 08/12/2014  Referring Physician: Nicholas Lose, MD  Primary Care Provider: [name], MD    Ms. BRITZY GRAUL, a 58 y.o. female, is being seen at the [facility] due to a personal and family history of breast cancer. She presents to clinic today to discuss the possibility of a hereditary predisposition to cancer and discuss whether genetic testing is warranted.  HISTORY OF PRESENT ILLNESS: Ms. Lisby was diagnosed with left breast cancer (IDC) at the age of 30. She stated that her treatment will start with neoadjuvant chemotherapy and she would like to use genetic testing information to decide what surgery to then have.   The breast tumor was ER negative, PR negative, and HER2 positive.  Ms. Markovic stated that she had a colonoscopy at age 62 which was negative for polyps. She has a yearly gynecologic exam.   Past Medical History  Diagnosis Date  . Uterine cyst   . Ovarian cyst   . Irregular menstrual cycle   . Hot flashes   . Breast cancer of upper-outer quadrant of left female breast     Past Surgical History  Procedure Laterality Date  . Uterine ablation  2006  . Cesarean section  1991    History   Social History  . Marital Status: Married    Spouse Name: N/A    Number of Children: N/A  . Years of Education: N/A   Social History Main Topics  . Smoking status: Never Smoker   . Smokeless tobacco: Never Used  . Alcohol Use: Yes     Comment: 4-5/week  . Drug Use: No  . Sexual Activity: Not on file   Other Topics Concern  . Not on file   Social History Narrative  . No narrative on file     FAMILY HISTORY:   During the visit, a 4-generation pedigree was obtained. Family tree will be sent for scanning and will be in EPIC under the Media tab.  Significant diagnoses include the following:  Family History  Problem Relation Age of Onset  . Breast cancer Maternal Grandmother    Dx 33s; Deceased 35s  . Breast cancer Paternal Aunt     Dx 57s; Deceased 69s  . Melanoma Paternal Aunt     deceased in teens    Additionally, Ms. Sedlak has a son (age 59) and a daughter (age 42) as well as a sister (age 86) and a brother (age 2). Her mother only had one brother and no sisters. Her father had 6 full siblings and 7 paternal half-siblings. She does not have much information about her paternal relatives.  Ms. Goosby ancestry is [place], [place] and [place]. There is no known Jewish ancestry and no consanguinity.  ASSESSMENT AND PLAN: Ms. Joens is a 58 y.o. female with a personal and family history of breast cancer diagnosed in her and her maternal grandmother in their 46s. This history is not highly suggestive of a hereditary predisposition to cancer, but Ms. Pavon is highly motivated to obtain this information. She stated that it would impact her surgical decision and she would potentially want to have bilateral mastectomies if a pathogenic mutation was identified in a highly-penetrant gene. We reviewed the characteristics, features and inheritance patterns of hereditary cancer syndromes. We also discussed genetic testing, including the process of testing, insurance coverage and implications of results. A negative result will be reassuring.  Ms. Jamie wished to pursue genetic testing and a  blood sample will be sent to [facility] for analysis of the 17 genes on the BreastNext gene panel. We discussed the implications of a positive, negative and/ or Variant of Uncertain Significance (VUS) result. Results should be available in approximately 4-5 weeks, at which point we will contact her and address implications for her as well as address genetic testing for at-risk family members, if needed.    We encouraged Ms. Ruest to remain in contact with Cancer Genetics annually so that we can update the family history and inform her of any changes in cancer genetics and testing that may be of  benefit for this family. Ms.  Margolis questions were answered to her satisfaction today.   Thank you for the referral and allowing [place] to share in the care of your patient.   The patient was seen for a total of 30 minutes, greater than 50% of which was spent face-to-face counseling. This patient was discussed with the overseeing provider who agrees with the above.   Steele Berg, MS, [place] Certified Genetic Counseor phone: [phone] [email or web address]

## 2014-08-13 ENCOUNTER — Encounter (HOSPITAL_BASED_OUTPATIENT_CLINIC_OR_DEPARTMENT_OTHER): Payer: Self-pay | Admitting: *Deleted

## 2014-08-13 ENCOUNTER — Encounter: Payer: Self-pay | Admitting: *Deleted

## 2014-08-13 NOTE — Progress Notes (Signed)
No new labs needed 

## 2014-08-16 ENCOUNTER — Encounter: Payer: Self-pay | Admitting: General Practice

## 2014-08-16 NOTE — Progress Notes (Signed)
[place] Psychosocial Distress Screening Spiritual Care  Visited with Zinnia and her husband at breast clinic to introduce [place] and [place] resources, reviewing distress screen per protocol.  The patient scored a 7 on the Psychosocial Distress Thermometer which indicates severe distress. Assessed for distress and other psychosocial needs.   ONCBCN DISTRESS SCREENING 08/16/2014  Screening Type Initial Screening  Elta [place] the number that describes how much distress you have been experiencing in the past week 7  Practical problem type Insurance  Emotional problem type Depression;Nervousness/Anxiety;Adjusting to illness  Information Concerns Type Lack of info about diagnosis;Lack of info about treatment;Lack of info about complementary therapy choices;Lack of info about maintaining fitness  Physical Problem type Sleep/insomnia  Referral to clinical social work Yes  Referral to support programs Yes  Other [place]    Acquainted with Syrai 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  Provided pastoral presence, reflective listening, and space for Cheridan to share and process about her retirement and about stress of recent diagnosis. She used the opportunity to work through her feelings now that she is aware of more details related to dx/tx and to begin to prepare herself inwardly for next steps in tx.    Follow up needed: No.  Pt/family aware of ongoing chaplain availability and [place]; provided contact information for family to reach out as needed.  [place], [place]

## 2014-08-17 ENCOUNTER — Other Ambulatory Visit: Payer: Self-pay | Admitting: Hematology and Oncology

## 2014-08-17 ENCOUNTER — Telehealth: Payer: Self-pay | Admitting: *Deleted

## 2014-08-17 ENCOUNTER — Telehealth: Payer: Self-pay | Admitting: Hematology and Oncology

## 2014-08-17 ENCOUNTER — Ambulatory Visit
Admission: RE | Admit: 2014-08-17 | Discharge: 2014-08-17 | Disposition: A | Discharge status: Home / Self Care | Payer: 59 | Source: Ambulatory Visit | Attending: Hematology and Oncology | Admitting: Hematology and Oncology

## 2014-08-17 DIAGNOSIS — C50412 Malignant neoplasm of upper-outer quadrant of left female breast: Secondary | ICD-10-CM

## 2014-08-17 MED ORDER — GADOBENATE DIMEGLUMINE 529 MG/ML IV SOLN
18.0000 mL | Freq: Once | INTRAVENOUS | Status: AC | PRN
  Administered 2014-08-17: 18 mL via INTRAVENOUS

## 2014-08-17 NOTE — Telephone Encounter (Signed)
, °

## 2014-08-17 NOTE — Telephone Encounter (Signed)
Per staff message and POF I have moved  appts. Advised scheduler of appts. JMW

## 2014-08-18 ENCOUNTER — Encounter (HOSPITAL_BASED_OUTPATIENT_CLINIC_OR_DEPARTMENT_OTHER): Payer: Self-pay | Admitting: Anesthesiology

## 2014-08-18 ENCOUNTER — Ambulatory Visit (HOSPITAL_COMMUNITY): Payer: 59

## 2014-08-18 ENCOUNTER — Ambulatory Visit (HOSPITAL_BASED_OUTPATIENT_CLINIC_OR_DEPARTMENT_OTHER)
Admission: RE | Admit: 2014-08-18 | Discharge: 2014-08-18 | Disposition: A | Discharge status: Home / Self Care | Payer: 59 | Source: Ambulatory Visit | Attending: General Surgery | Admitting: General Surgery

## 2014-08-18 ENCOUNTER — Other Ambulatory Visit: Payer: Self-pay | Admitting: Hematology and Oncology

## 2014-08-18 ENCOUNTER — Ambulatory Visit (HOSPITAL_BASED_OUTPATIENT_CLINIC_OR_DEPARTMENT_OTHER): Payer: 59 | Admitting: Anesthesiology

## 2014-08-18 ENCOUNTER — Encounter (HOSPITAL_BASED_OUTPATIENT_CLINIC_OR_DEPARTMENT_OTHER)
Admission: RE | Disposition: A | Discharge status: Home / Self Care | Payer: Self-pay | Source: Ambulatory Visit | Attending: General Surgery

## 2014-08-18 ENCOUNTER — Encounter (HOSPITAL_BASED_OUTPATIENT_CLINIC_OR_DEPARTMENT_OTHER): Payer: 59 | Admitting: Anesthesiology

## 2014-08-18 DIAGNOSIS — C50912 Malignant neoplasm of unspecified site of left female breast: Secondary | ICD-10-CM | POA: Insufficient documentation

## 2014-08-18 DIAGNOSIS — I10 Essential (primary) hypertension: Secondary | ICD-10-CM | POA: Diagnosis not present

## 2014-08-18 DIAGNOSIS — K219 Gastro-esophageal reflux disease without esophagitis: Secondary | ICD-10-CM | POA: Insufficient documentation

## 2014-08-18 DIAGNOSIS — D241 Benign neoplasm of right breast: Secondary | ICD-10-CM

## 2014-08-18 DIAGNOSIS — Z95828 Presence of other vascular implants and grafts: Secondary | ICD-10-CM

## 2014-08-18 DIAGNOSIS — R928 Other abnormal and inconclusive findings on diagnostic imaging of breast: Secondary | ICD-10-CM

## 2014-08-18 HISTORY — PX: PORTACATH PLACEMENT: SHX2246

## 2014-08-18 HISTORY — DX: Nausea with vomiting, unspecified: R11.2

## 2014-08-18 HISTORY — DX: Presence of spectacles and contact lenses: Z97.3

## 2014-08-18 HISTORY — DX: Other specified postprocedural states: Z98.890

## 2014-08-18 SURGERY — INSERTION, TUNNELED CENTRAL VENOUS DEVICE, WITH PORT
Anesthesia: General | Site: Chest | Laterality: Left

## 2014-08-18 MED ORDER — SCOPOLAMINE 1 MG/3DAYS TD PT72
MEDICATED_PATCH | TRANSDERMAL | Status: DC | PRN
  Administered 2014-08-18: 1 via TRANSDERMAL

## 2014-08-18 MED ORDER — ONDANSETRON HCL 4 MG/2ML IJ SOLN
INTRAMUSCULAR | Status: DC | PRN
  Administered 2014-08-18: 4 mg via INTRAVENOUS

## 2014-08-18 MED ORDER — FENTANYL CITRATE 0.05 MG/ML IJ SOLN: 25.0000 ug | INTRAMUSCULAR | Status: DC | PRN

## 2014-08-18 MED ORDER — FENTANYL CITRATE 0.05 MG/ML IJ SOLN
INTRAMUSCULAR | Status: DC | PRN
  Administered 2014-08-18 (×4): 25 ug via INTRAVENOUS

## 2014-08-18 MED ORDER — CIPROFLOXACIN IN D5W 400 MG/200ML IV SOLN
INTRAVENOUS | Status: AC
  Filled 2014-08-18: qty 200

## 2014-08-18 MED ORDER — BUPIVACAINE HCL (PF) 0.25 % IJ SOLN
INTRAMUSCULAR | Status: AC
  Filled 2014-08-18: qty 30

## 2014-08-18 MED ORDER — ONDANSETRON HCL 4 MG/2ML IJ SOLN: 4.0000 mg | Freq: Four times a day (QID) | INTRAMUSCULAR | Status: DC | PRN

## 2014-08-18 MED ORDER — FENTANYL CITRATE 0.05 MG/ML IJ SOLN: 50.0000 ug | INTRAMUSCULAR | Status: DC | PRN

## 2014-08-18 MED ORDER — SCOPOLAMINE 1 MG/3DAYS TD PT72
MEDICATED_PATCH | TRANSDERMAL | Status: AC
  Filled 2014-08-18: qty 1

## 2014-08-18 MED ORDER — HYDROCODONE-ACETAMINOPHEN 5-325 MG PO TABS: 1.0000 | ORAL_TABLET | ORAL | Status: DC | PRN

## 2014-08-18 MED ORDER — MIDAZOLAM HCL 2 MG/2ML IJ SOLN
INTRAMUSCULAR | Status: AC
  Filled 2014-08-18: qty 2

## 2014-08-18 MED ORDER — SODIUM CHLORIDE 0.9 % IJ SOLN: 3.0000 mL | Freq: Two times a day (BID) | INTRAMUSCULAR | Status: DC

## 2014-08-18 MED ORDER — PROPOFOL 10 MG/ML IV BOLUS
INTRAVENOUS | Status: DC | PRN
  Administered 2014-08-18: 20 mg via INTRAVENOUS
  Administered 2014-08-18: 30 mg via INTRAVENOUS

## 2014-08-18 MED ORDER — BUPIVACAINE HCL (PF) 0.25 % IJ SOLN
INTRAMUSCULAR | Status: DC | PRN
  Administered 2014-08-18: 17 mL

## 2014-08-18 MED ORDER — SODIUM CHLORIDE 0.9 % IV SOLN: 250.0000 mL | INTRAVENOUS | Status: DC | PRN

## 2014-08-18 MED ORDER — FENTANYL CITRATE 0.05 MG/ML IJ SOLN
INTRAMUSCULAR | Status: AC
  Filled 2014-08-18: qty 6

## 2014-08-18 MED ORDER — HEPARIN (PORCINE) IN NACL 2-0.9 UNIT/ML-% IJ SOLN
INTRAMUSCULAR | Status: DC | PRN
  Administered 2014-08-18: 1 via INTRAVENOUS

## 2014-08-18 MED ORDER — SODIUM CHLORIDE 0.9 % IJ SOLN: 3.0000 mL | INTRAMUSCULAR | Status: DC | PRN

## 2014-08-18 MED ORDER — CIPROFLOXACIN IN D5W 400 MG/200ML IV SOLN
INTRAVENOUS | Status: DC | PRN
  Administered 2014-08-18: 400 mg via INTRAVENOUS

## 2014-08-18 MED ORDER — OXYCODONE HCL 5 MG/5ML PO SOLN: 5.0000 mg | Freq: Once | ORAL | Status: DC | PRN

## 2014-08-18 MED ORDER — LIDOCAINE HCL (CARDIAC) 20 MG/ML IV SOLN
INTRAVENOUS | Status: DC | PRN
  Administered 2014-08-18: 10 mg via INTRAVENOUS

## 2014-08-18 MED ORDER — LIDOCAINE-EPINEPHRINE (PF) 1 %-1:200000 IJ SOLN
INTRAMUSCULAR | Status: AC
  Filled 2014-08-18: qty 10

## 2014-08-18 MED ORDER — OXYCODONE HCL 5 MG PO TABS: 5.0000 mg | ORAL_TABLET | Freq: Once | ORAL | Status: DC | PRN

## 2014-08-18 MED ORDER — MIDAZOLAM HCL 5 MG/5ML IJ SOLN
INTRAMUSCULAR | Status: DC | PRN
  Administered 2014-08-18: 2 mg via INTRAVENOUS

## 2014-08-18 MED ORDER — HEPARIN SOD (PORK) LOCK FLUSH 100 UNIT/ML IV SOLN
INTRAVENOUS | Status: DC | PRN
  Administered 2014-08-18: 5 [IU] via INTRAVENOUS

## 2014-08-18 MED ORDER — MIDAZOLAM HCL 2 MG/2ML IJ SOLN: 1.0000 mg | INTRAMUSCULAR | Status: DC | PRN

## 2014-08-18 MED ORDER — LACTATED RINGERS IV SOLN
INTRAVENOUS | Status: DC
  Administered 2014-08-18: 11:00:00 via INTRAVENOUS

## 2014-08-18 MED ORDER — CEFAZOLIN SODIUM-DEXTROSE 2-3 GM-% IV SOLR: 2.0000 g | INTRAVENOUS | Status: DC

## 2014-08-18 MED ORDER — OXYCODONE HCL 5 MG PO TABS: 5.0000 mg | ORAL_TABLET | ORAL | Status: DC | PRN

## 2014-08-18 MED ORDER — ACETAMINOPHEN 325 MG PO TABS: 650.0000 mg | ORAL_TABLET | ORAL | Status: DC | PRN

## 2014-08-18 MED ORDER — HEPARIN SOD (PORK) LOCK FLUSH 100 UNIT/ML IV SOLN
INTRAVENOUS | Status: AC
  Filled 2014-08-18: qty 5

## 2014-08-18 MED ORDER — ACETAMINOPHEN 650 MG RE SUPP: 650.0000 mg | RECTAL | Status: DC | PRN

## 2014-08-18 SURGICAL SUPPLY — 40 items
BAG DECANTER FOR FLEXI CONT (MISCELLANEOUS) ×2 IMPLANT
BLADE HEX COATED 2.75 (ELECTRODE) ×2 IMPLANT
BLADE SURG 11 STRL SS (BLADE) ×2 IMPLANT
BLADE SURG 15 STRL LF DISP TIS (BLADE) ×1 IMPLANT
BLADE SURG 15 STRL SS (BLADE) ×1
CHLORAPREP W/TINT 26ML (MISCELLANEOUS) ×2 IMPLANT
COVER BACK TABLE 60X90IN (DRAPES) ×2 IMPLANT
COVER MAYO STAND STRL (DRAPES) ×2 IMPLANT
DECANTER SPIKE VIAL GLASS SM (MISCELLANEOUS) ×2 IMPLANT
DERMABOND ADVANCED (GAUZE/BANDAGES/DRESSINGS) ×1
DERMABOND ADVANCED .7 DNX12 (GAUZE/BANDAGES/DRESSINGS) ×1 IMPLANT
DRAPE C-ARM 42X72 X-RAY (DRAPES) ×2 IMPLANT
DRAPE LAPAROTOMY TRNSV 102X78 (DRAPE) ×2 IMPLANT
DRAPE UTILITY XL STRL (DRAPES) ×2 IMPLANT
DRSG TEGADERM 4X4.75 (GAUZE/BANDAGES/DRESSINGS) ×2 IMPLANT
ELECT REM PT RETURN 9FT ADLT (ELECTROSURGICAL) ×2
ELECTRODE REM PT RTRN 9FT ADLT (ELECTROSURGICAL) ×1 IMPLANT
GLOVE BIO SURGEON STRL SZ 6 (GLOVE) ×2 IMPLANT
GLOVE BIO SURGEON STRL SZ 6.5 (GLOVE) ×4 IMPLANT
GLOVE BIOGEL PI IND STRL 6.5 (GLOVE) ×1 IMPLANT
GLOVE BIOGEL PI INDICATOR 6.5 (GLOVE) ×1
GOWN STRL REUS W/ TWL LRG LVL3 (GOWN DISPOSABLE) ×1 IMPLANT
GOWN STRL REUS W/TWL 2XL LVL3 (GOWN DISPOSABLE) ×2 IMPLANT
GOWN STRL REUS W/TWL LRG LVL3 (GOWN DISPOSABLE) ×1
KIT PORT POWER 8FR ISP CVUE (Catheter) ×2 IMPLANT
NEEDLE HYPO 25X1 1.5 SAFETY (NEEDLE) ×2 IMPLANT
PACK BASIN DAY SURGERY FS (CUSTOM PROCEDURE TRAY) ×2 IMPLANT
PENCIL BUTTON HOLSTER BLD 10FT (ELECTRODE) ×2 IMPLANT
SLEEVE SCD COMPRESS KNEE MED (MISCELLANEOUS) ×2 IMPLANT
SPONGE GAUZE 4X4 12PLY STER LF (GAUZE/BANDAGES/DRESSINGS) IMPLANT
SUT MNCRL AB 4-0 PS2 18 (SUTURE) ×2 IMPLANT
SUT PROLENE 2 0 SH DA (SUTURE) ×4 IMPLANT
SUT VIC AB 3-0 SH 27 (SUTURE) ×1
SUT VIC AB 3-0 SH 27X BRD (SUTURE) ×1 IMPLANT
SUT VICRYL 3-0 CR8 SH (SUTURE) IMPLANT
SYR 5ML LUER SLIP (SYRINGE) ×2 IMPLANT
SYR CONTROL 10ML LL (SYRINGE) ×2 IMPLANT
SYRINGE 10CC LL (SYRINGE) ×2 IMPLANT
TOWEL OR 17X24 6PK STRL BLUE (TOWEL DISPOSABLE) ×2 IMPLANT
TOWEL OR NON WOVEN STRL DISP B (DISPOSABLE) ×2 IMPLANT

## 2014-08-18 NOTE — Op Note (Signed)
PREOPERATIVE DIAGNOSIS:  Left breast cancer     POSTOPERATIVE DIAGNOSIS:  Same     PROCEDURE: left subclavian port placement, Bard ClearVue  Power Port, MRI safe, 8-French.      SURGEON:  [name], MD      ANESTHESIA:  General   FINDINGS:  Good venous return, easy flush, and tip of the catheter and   SVC 18.5 cm.      SPECIMEN:  None.      ESTIMATED BLOOD LOSS:  Minimal.      COMPLICATIONS:  None known.      PROCEDURE:  Pt was identified in the holding area and taken to   the operating room, where patient was placed supine on the operating room   table.  MAC anesthesia was induced.  Patient's arms were tucked and the upper   chest and neck were prepped and draped in sterile fashion.  Time-out was   performed according to the surgical safety check list.  When all was   correct, we continued.   Local anesthetic was administered over this   area at the angle of the clavicle.  The vein was accessed with 1 pass of the needle. There was good venous return and the wire passed easily with no ectopy.   Fluoroscopy was used to confirm that the wire was in the vena cava.      The patient was placed back level and the area for the pocket was anethetized   with local anesthetic.  A 3-cm transverse incision was made with a #15   blade.  Cautery was used to divide the subcutaneous tissues down to the   pectoralis muscle.  An Army-Nav[employer] retractor was used to elevate the skin   while a pocket was created on top of the pectoralis fascia.  The port   was placed into the pocket to confirm that it was of adequate size.  The   catheter was preattached to the port.  The port was then secured to the   pectoralis fascia with four 2-0 Prolene sutures.  These were clamped and   not tied down yet.    The catheter was tunneled through to the wire exit   site.  The catheter was placed along the wire to determine what length it should be to be in the SVC.  The catheter was cut at 18.5 cm.  The tunneler  sheath and dilator were passed over the wire and the dilator and wire were removed.  The catheter was advanced through the tunneler sheath and the tunneler sheath was pulled away.  Care was taken to keep the catheter in the tunneler sheath as this occurred. This was advanced and the tunneler sheath was removed.  There was good venous   return and easy flush of the catheter.  The Prolene sutures were tied   down to the pectoral fascia.  The skin was reapproximated using 3-0   Vicryl interrupted deep dermal sutures.    Fluoroscopy was used to re-confirm good position of the catheter.  The skin   was then closed using 4-0 Monocryl in a subcuticular fashion.  The port was flushed with concentrated heparin flush as well.  The wounds were then cleaned, dried, and dressed with Dermabond.  The patient was awakened from anesthesia and taken to the PACU in stable condition.  Needle, sponge, and instrument counts were correct.               [name], MD

## 2014-08-18 NOTE — Transfer of Care (Signed)
Immediate Anesthesia Transfer of Care Note  Patient: Julie [name]  Procedure(s) Performed: Procedure(s): INSERTION PORT-A-CATH (Left)  Patient Location: PACU  Anesthesia Type:MAC  Level of Consciousness: awake, alert , oriented and patient cooperative  Airway & Oxygen Therapy: Patient Spontanous Breathing and Patient connected to face mask oxygen  Post-op Assessment: Report given to PACU RN and Post -op Vital signs reviewed and stable  Post vital signs: Reviewed and stable  Complications: No apparent anesthesia complications

## 2014-08-18 NOTE — H&P (Signed)
Julie [name] 08/11/2014 7:53 AM Location: [facility] Patient #: [id number] DOB: Julie [name] Undefined / Language: Julie [name] / Race: Undefined Female  History of Present Illness Julie Klein MD; 08/11/2014 12:03 PM) The patient is a 58 year old female who presents with breast cancer. The patient is being seen for a consultation for Stage II ( T2 and N0 ) invasive ductal carcinoma (Grade 3, Ki67 90%) of the left breast (upper inner quadrant). Tumor markers include estrogen receptor negative, progesterone receptor negative and HER -2/neu positive. The patient was referred by a primary care provider (Dr. Maudry [name]). Initial presentation was for breast mass on medical examination (Mass was detected on screening mammo, but it was present at exam for diagnostic mammogram.) and an abnormal mammogram. Current diagnosis was determined by mammography (2.3 x 2.0 x 1.4 cm), left breast ultrasound and core needle biopsy. Symptoms include skin changes in the involved breast (bruising), while symptoms do not include breast pain, nipple discharge, fatigue, nausea or vomiting. Risk factors include breast cancer in a second degree relative. She is postmenopausal.   Other Problems Julie [name]; 08/11/2014 7:53 AM) Gastroesophageal Reflux Disease High blood pressure  Past Surgical History Julie [name]; 08/11/2014 7:53 AM) Cesarean Section - 1 Oral Surgery  Diagnostic Studies History Julie [name]; 08/11/2014 7:53 AM) Colonoscopy 1-5 years ago Mammogram within last year Pap Smear 1-5 years ago  Social History Julie [name]; 08/11/2014 7:53 AM) Alcohol use Occasional alcohol use. No caffeine use No drug use Tobacco use Never smoker.  Family History Julie [name]; 08/11/2014 7:53 AM) Arthritis Mother. Depression Father. Hypertension Father, Mother. Prostate Cancer Father.  Pregnancy / Birth History Julie [name]; 08/11/2014 7:53 AM) Age at menarche 72  years. Age of menopause <47 Contraceptive History Oral contraceptives. Gravida 3 Irregular periods Maternal age 24-20 Para 2  Review of Systems Julie [name]; 08/11/2014 7:53 AM) General Present- Night Sweats. Not Present- Appetite Loss, Chills, Fatigue, Fever, Weight Gain and Weight Loss. Skin Not Present- Change in Wart/Mole, Dryness, Hives, Jaundice, New Lesions, Non-Healing Wounds, Rash and Ulcer. HEENT Present- Seasonal Allergies and Wears glasses/contact lenses. Not Present- Earache, Hearing Loss, Hoarseness, Nose Bleed, Oral Ulcers, Ringing in the Ears, Sinus Pain, Sore Throat, Visual Disturbances and Yellow Eyes. Respiratory Present- Snoring. Not Present- Bloody sputum, Chronic Cough, Difficulty Breathing and Wheezing. Breast Not Present- Breast Mass, Breast Pain, Nipple Discharge and Skin Changes. Cardiovascular Not Present- Chest Pain, Difficulty Breathing Lying Down, Leg Cramps, Palpitations, Rapid Heart Rate, Shortness of Breath and Swelling of Extremities. Gastrointestinal Not Present- Abdominal Pain, Bloating, Bloody Stool, Change in Bowel Habits, Chronic diarrhea, Constipation, Difficulty Swallowing, Excessive gas, Gets full quickly at meals, Hemorrhoids, Indigestion, Nausea, Rectal Pain and Vomiting. Musculoskeletal Not Present- Back Pain, Joint Pain, Joint Stiffness, Muscle Pain, Muscle Weakness and Swelling of Extremities. Neurological Not Present- Decreased Memory, Fainting, Headaches, Numbness, Seizures, Tingling, Tremor, Trouble walking and Weakness. Psychiatric Present- Anxiety. Not Present- Bipolar, Change in Sleep Pattern, Depression, Fearful and Frequent crying. Endocrine Present- Hot flashes. Not Present- Cold Intolerance, Excessive Hunger, Hair Changes, Heat Intolerance and New Diabetes. Hematology Not Present- Easy Bruising, Excessive bleeding, Gland problems, HIV and Persistent Infections.   Physical Exam Julie Klein MD; 08/11/2014 12:05  PM) General Mental Status-Alert. General Appearance-Consistent with stated age. Hydration-Well hydrated. Voice-Normal.  Head and Neck Head-normocephalic, atraumatic with no lesions or palpable masses. Trachea-midline. Thyroid Gland Characteristics - normal size and consistency.  Eye Eyeball - Bilateral-Extraocular movements intact. Sclera/Conjunctiva - Bilateral-No scleral icterus.  Chest and Lung Exam Chest and lung exam  reveals -quiet, even and easy respiratory effort with no use of accessory muscles and on auscultation, normal breath sounds, no adventitious sounds and normal vocal resonance. Inspection Chest Wall - Normal. Back - normal.  Breast Note: hematoma on upper inner quadrant of left breast. palpable 2-3 cm mobile mass. No skin dimpling. no nipple retraction or nipple discharge. No lymphadenopathy. Breasts are relatively symmetric bilaterally, moderate ptosis. Right breast without positive findings.   Cardiovascular Cardiovascular examination reveals -normal heart sounds, regular rate and rhythm with no murmurs and normal pedal pulses bilaterally.  Abdomen Inspection Inspection of the abdomen reveals - No Hernias. Palpation/Percussion Palpation and Percussion of the abdomen reveal - Soft, Non Tender, No Rebound tenderness, No Rigidity (guarding) and No hepatosplenomegaly. Auscultation Auscultation of the abdomen reveals - Bowel sounds normal.  Neurologic Neurologic evaluation reveals -alert and oriented x 3 with no impairment of recent or remote memory. Mental Status-Normal.  Musculoskeletal Global Assessment -Note: no gross deformities.  Normal Exam - Left-Upper Extremity Strength Normal and Lower Extremity Strength Normal. Normal Exam - Right-Upper Extremity Strength Normal and Lower Extremity Strength Normal.  Lymphatic Head & Neck  General Head & Neck Lymphatics: Bilateral - Description - Normal. Axillary  General  Axillary Region: Bilateral - Description - Normal. Tenderness - Non Tender. Femoral & Inguinal  Generalized Femoral & Inguinal Lymphatics: Bilateral - Description - No Generalized lymphadenopathy.    Assessment & Plan Julie Klein MD; 08/11/2014 12:09 PM) CARCINOMA OF UPPER-INNER QUADRANT OF LEFT FEMALE BREAST (174.2  C50.212) Impression: Pt has a new diagnosis of Stage II hormone receptor negative, Her 2 positive breast cancer. Based on the size and the her 2 positivity, we will plan neoadjuvant chemotherapy. She will also have a genetics referral to assess her for high risk cancer genes based on her age and family history.  I will place a port a cath for surgery. She prefers conscious sedation. We discussed the procedure. We reviewed the risks including bleeding, infection, damage to adjacent structures, pneumothorax, possible need for reposition, possible clot, and others.  I will then see her at the midpoint of chemo and toward the end for final surgical planning. If she has a cancer gene, she would desire bilateral mastectomies and reconstruction. She and husband agree to proceed with the plan.  60 min spent in evaluation, examination, counseling, and coordination of care. >50% spent in counseling. Current Plans  Schedule for Surgery Pt Education - ccs port insertion education   Signed by Julie Klein, MD (08/11/2014 12:10 PM)

## 2014-08-18 NOTE — Progress Notes (Signed)
Dr Lindi Adie requested pt start date be moved up from 11/4.  [place] patient.  Inbasket sent to navigators.

## 2014-08-18 NOTE — Anesthesia Procedure Notes (Signed)
Procedure Name: MAC Date/Time: 08/18/2014 11:37 AM Performed by: Marrianne Mood Pre-anesthesia Checklist: Patient identified, Timeout performed, Emergency Drugs available, Suction available and Patient being monitored Patient Re-evaluated:Patient Re-evaluated prior to inductionOxygen Delivery Method: Simple face mask

## 2014-08-18 NOTE — Anesthesia Preprocedure Evaluation (Signed)
Anesthesia Evaluation  Patient identified by MRN, date of birth, ID band Patient awake    Reviewed: Allergy & Precautions, H&P , NPO status , Patient's Chart, lab work & pertinent test results  History of Anesthesia Complications (+) PONV and history of anesthetic complications  Airway Mallampati: II  Neck ROM: full    Dental   Pulmonary neg pulmonary ROS,          Cardiovascular negative cardio ROS      Neuro/Psych    GI/Hepatic   Endo/Other  obese  Renal/GU      Musculoskeletal   Abdominal   Peds  Hematology   Anesthesia Other Findings   Reproductive/Obstetrics                           Anesthesia Physical Anesthesia Plan  ASA: I  Anesthesia Plan: General   Post-op Pain Management:    Induction: Intravenous  Airway Management Planned: LMA  Additional Equipment:   Intra-op Plan:   Post-operative Plan:   Informed Consent: I have reviewed the patients History and Physical, chart, labs and discussed the procedure including the risks, benefits and alternatives for the proposed anesthesia with the patient or authorized representative who has indicated his/her understanding and acceptance.     Plan Discussed with: CRNA, Anesthesiologist and Surgeon  Anesthesia Plan Comments:         Anesthesia Quick Evaluation

## 2014-08-18 NOTE — Anesthesia Postprocedure Evaluation (Signed)
Anesthesia Post Note  Patient: Julie [name]  Procedure(s) Performed: Procedure(s) (LRB): INSERTION PORT-A-CATH (Left)  Anesthesia type: MAC  Patient location: PACU  Post pain: Pain level controlled and Adequate analgesia  Post assessment: Post-op Vital signs reviewed, Patient's Cardiovascular Status Stable and Respiratory Function Stable  Last Vitals:  Filed Vitals:   08/18/14 1404  BP: 158/87  Pulse: 71  Temp: 36.4 C  Resp: 20    Post vital signs: Reviewed and stable  Level of consciousness: awake, alert  and oriented  Complications: No apparent anesthesia complications

## 2014-08-18 NOTE — Interval H&P Note (Signed)
History and Physical Interval Note:  08/18/2014 11:24 AM  Julie [name]  has presented today for surgery, with the diagnosis of left breast cancer  The various methods of treatment have been discussed with the patient and family. After consideration of risks, benefits and other options for treatment, the patient has consented to  Procedure(s): INSERTION PORT-A-CATH (N/A) as a surgical intervention .  The patient's history has been reviewed, patient examined, no change in status, stable for surgery.  I have reviewed the patient's chart and labs.  Questions were answered to the patient's satisfaction.     Jahmier Willadsen

## 2014-08-18 NOTE — Discharge Instructions (Addendum)
[place] Office Phone Number [phone]   POST OP INSTRUCTIONS  Always review your discharge instruction sheet given to you by the facility where your surgery was performed.  IF YOU HAVE DISABILITY OR FAMILY LEAVE FORMS, YOU MUST BRING THEM TO THE OFFICE FOR PROCESSING.  DO NOT GIVE THEM TO YOUR DOCTOR.  1. A prescription for pain medication may be given to you upon discharge.  Take your pain medication as prescribed, if needed.  If narcotic pain medicine is not needed, then you may take acetaminophen (Tylenol) or ibuprofen (Advil) as needed. 2. Take your usually prescribed medications unless otherwise directed 3. If you need a refill on your pain medication, please contact your pharmacy.  They will contact our office to request authorization.  Prescriptions will not be filled after 5pm or on week-ends. 4. You should eat very light the first 24 hours after surgery, such as soup, crackers, pudding, etc.  Resume your normal diet the day after surgery 5. It is common to experience some constipation if taking pain medication after surgery.  Increasing fluid intake and taking a stool softener will usually help or prevent this problem from occurring.  A mild laxative (Milk of Magnesia or Miralax) should be taken according to package directions if there are no bowel movements after 48 hours. 6. You may shower in 48 hours.  The surgical glue will flake off in 2-3 weeks.   7. ACTIVITIES:  No strenuous activity or heavy lifting for 1 week.   a. You may drive when you no longer are taking prescription pain medication, you can comfortably wear a seatbelt, and you can safely maneuver your car and apply brakes. b. RETURN TO WORK:  __________1 week or to be determined.  ____________ Dennis Bast should see your doctor in the office for a follow-up appointment approximately three-four weeks after your surgery.    WHEN TO CALL YOUR DOCTOR: 1. Fever over 101.0 2. Nausea and/or vomiting. 3. Extreme  swelling or bruising. 4. Continued bleeding from incision. 5. Increased pain, redness, or drainage from the incision.  The clinic staff is available to answer your questions during regular business hours.  Please dont hesitate to call and ask to speak to one of the nurses for clinical concerns.  If you have a medical emergency, go to the nearest emergency room or call 911.  A surgeon from [facility] is always on call at the hospital.  For further questions, please visit [email or web address]      Post Anesthesia Home Care Instructions  Activity: Get plenty of rest for the remainder of the day. A responsible adult should stay with you for 24 hours following the procedure.  For the next 24 hours, DO NOT: -Drive a car -Paediatric nurse -Drink alcoholic beverages -Take any medication unless instructed by your physician -Make any legal decisions or sign important papers.  Meals: Start with liquid foods such as gelatin or soup. Progress to regular foods as tolerated. Avoid greasy, spicy, heavy foods. If nausea and/or vomiting occur, drink only clear liquids until the nausea and/or vomiting subsides. Call your physician if vomiting continues.  Special Instructions/Symptoms: Your throat may feel dry or sore from the anesthesia or the breathing tube placed in your throat during surgery. If this causes discomfort, gargle with warm salt water. The discomfort should disappear within 24 hours.   Call your surgeon if you experience:   1.  Fever over 101.0. 2.  Inability to urinate. 3.  Nausea and/or vomiting. 4.  Extreme  swelling or bruising at the surgical site. 5.  Continued bleeding from the incision. 6.  Increased pain, redness or drainage from the incision. 7.  Problems related to your pain medication. 8. Any change in color, movement and/or sensation 9. Any problems and/or concerns

## 2014-08-19 ENCOUNTER — Encounter (HOSPITAL_BASED_OUTPATIENT_CLINIC_OR_DEPARTMENT_OTHER): Payer: Self-pay | Admitting: General Surgery

## 2014-08-19 ENCOUNTER — Other Ambulatory Visit: Payer: Self-pay | Admitting: *Deleted

## 2014-08-19 ENCOUNTER — Telehealth: Payer: Self-pay | Admitting: *Deleted

## 2014-08-19 ENCOUNTER — Other Ambulatory Visit: Payer: 59

## 2014-08-19 ENCOUNTER — Ambulatory Visit (HOSPITAL_COMMUNITY)
Admission: RE | Admit: 2014-08-19 | Discharge: 2014-08-19 | Disposition: A | Discharge status: Home / Self Care | Payer: 59 | Source: Ambulatory Visit | Attending: Hematology and Oncology | Admitting: Hematology and Oncology

## 2014-08-19 DIAGNOSIS — C50412 Malignant neoplasm of upper-outer quadrant of left female breast: Secondary | ICD-10-CM

## 2014-08-19 DIAGNOSIS — I519 Heart disease, unspecified: Secondary | ICD-10-CM | POA: Diagnosis not present

## 2014-08-19 NOTE — Telephone Encounter (Signed)
Per breast navigator I have scheduled appt for 10/29

## 2014-08-19 NOTE — Progress Notes (Signed)
  Echocardiogram 2D Echocardiogram has been performed.  Julie [name] 08/19/2014, 10:36 AM

## 2014-08-20 ENCOUNTER — Ambulatory Visit (HOSPITAL_BASED_OUTPATIENT_CLINIC_OR_DEPARTMENT_OTHER): Payer: 59

## 2014-08-20 ENCOUNTER — Other Ambulatory Visit: Payer: Self-pay | Admitting: *Deleted

## 2014-08-20 ENCOUNTER — Ambulatory Visit (HOSPITAL_BASED_OUTPATIENT_CLINIC_OR_DEPARTMENT_OTHER): Payer: 59 | Admitting: Hematology and Oncology

## 2014-08-20 ENCOUNTER — Encounter: Payer: Self-pay | Admitting: Hematology and Oncology

## 2014-08-20 ENCOUNTER — Encounter (INDEPENDENT_AMBULATORY_CARE_PROVIDER_SITE_OTHER): Payer: Self-pay

## 2014-08-20 ENCOUNTER — Other Ambulatory Visit: Payer: Self-pay

## 2014-08-20 VITALS — BP 136/81 | HR 99 | Temp 98.5°F | Resp 19 | Ht 62.0 in | Wt 200.7 lb

## 2014-08-20 DIAGNOSIS — C50412 Malignant neoplasm of upper-outer quadrant of left female breast: Secondary | ICD-10-CM

## 2014-08-20 LAB — CBC WITH DIFFERENTIAL/PLATELET
BASO%: 0.6 % (ref 0.0–2.0)
Basophils Absolute: 0 10*3/uL (ref 0.0–0.1)
EOS ABS: 0.3 10*3/uL (ref 0.0–0.5)
EOS%: 3.8 % (ref 0.0–7.0)
HCT: 41.9 % (ref 34.8–46.6)
HGB: 13.7 g/dL (ref 11.6–15.9)
LYMPH%: 29.7 % (ref 14.0–49.7)
MCH: 29.1 pg (ref 25.1–34.0)
MCHC: 32.6 g/dL (ref 31.5–36.0)
MCV: 89 fL (ref 79.5–101.0)
MONO#: 0.7 10*3/uL (ref 0.1–0.9)
MONO%: 8.5 % (ref 0.0–14.0)
NEUT#: 4.7 10*3/uL (ref 1.5–6.5)
NEUT%: 57.4 % (ref 38.4–76.8)
PLATELETS: 238 10*3/uL (ref 145–400)
RBC: 4.7 10*6/uL (ref 3.70–5.45)
RDW: 13 % (ref 11.2–14.5)
WBC: 8.1 10*3/uL (ref 3.9–10.3)
lymph#: 2.4 10*3/uL (ref 0.9–3.3)

## 2014-08-20 MED ORDER — LORAZEPAM 0.5 MG PO TABS: 0.5000 mg | ORAL_TABLET | Freq: Four times a day (QID) | ORAL | Status: DC | PRN

## 2014-08-20 MED ORDER — DEXAMETHASONE 4 MG PO TABS
8.0000 mg | ORAL_TABLET | Freq: Two times a day (BID) | ORAL | Status: DC
Start: 2014-08-20 — End: 2015-01-07

## 2014-08-20 MED ORDER — LIDOCAINE-PRILOCAINE 2.5-2.5 % EX CREA: 1.0000 "application " | TOPICAL_CREAM | CUTANEOUS | Status: DC | PRN

## 2014-08-20 MED ORDER — INFLUENZA VAC SPLIT QUAD 0.5 ML IM SUSY
0.5000 mL | PREFILLED_SYRINGE | INTRAMUSCULAR | Status: AC
  Filled 2014-08-20: qty 0.5

## 2014-08-20 MED ORDER — ONDANSETRON HCL 8 MG PO TABS: 8.0000 mg | ORAL_TABLET | Freq: Two times a day (BID) | ORAL | Status: DC

## 2014-08-20 MED ORDER — PROCHLORPERAZINE MALEATE 10 MG PO TABS: 10.0000 mg | ORAL_TABLET | Freq: Four times a day (QID) | ORAL | Status: DC | PRN

## 2014-08-20 NOTE — Assessment & Plan Note (Signed)
Left breast invasive ductal carcinoma grade 3 ER 0% PR 0% HER-2 positive ratio 3.74, Ki-67 90%: 2.3 cm mass by ultrasound clinical stage: T2, N0, M0 stage II A. Right breast abnormality detected through MRI, MRI guided biopsy to be done next Tuesday.  Treatment plan: Neoadjuvant chemotherapy with Taxotere, carboplatin, Herceptin, Perjeta every 3 weeks x6 cycles of Herceptin maintenance for one year. Patient had gone through chemotherapy patient class today. Her echocardiogram showed an EF of 60-65%. Patient is set up for chemotherapy to start on October 30. She will be followed up weekly at her to see my last practitioner Nira Conn for toxicity evaluation.  The patient signed the consent form and we will see her back for chemotherapy.

## 2014-08-20 NOTE — Progress Notes (Signed)
Patient Care Team: [name], MD as PCP - General (Internal Medicine)  DIAGNOSIS: Breast cancer of upper-outer quadrant of left female breast   Primary site: Breast (Left)   Staging method: AJCC 7th Edition   Clinical: Stage IIA (T2, N0, cM0)   Summary: Stage IIA (T2, N0, cM0)   Clinical comments: Staged at breast conference 08/11/14.   SUMMARY OF ONCOLOGIC HISTORY:   Breast cancer of upper-outer quadrant of left female breast   08/05/2014 Mammogram Left breast 10 o clock: 2.3X 2X1.4 cm lobular hypoechoeic   08/05/2014 Initial Biopsy Grade 3 IDC Er 0%, PR 0%, Her 2 positive Ratio 3.74; Ki 67: 90%    CHIEF COMPLIANT: Patient is here to discuss chemotherapy plan  INTERVAL HISTORY: Julie [name] is a 58 year old Caucasian lady with above-mentioned history of left-sided breast cancer that was ER/PR negative HER-2 positive. We discussed with her neoadjuvant chemotherapy and she underwent a breast MRI and is here today to discuss the results of the breast MRI and to finalize a treatment plan. The breast MRI revealed an abnormality in the right breast upper inner quadrant that is being scheduled to undergo an MRI guided biopsy next Tuesday. She underwent a port placement on Wednesday and is still very sore from it. It feels like a burning sensation are tugging sensation from the port site. Her chemotherapy has been preformed to start next Friday.  REVIEW OF SYSTEMS:   Constitutional: Denies fevers, chills or abnormal weight loss Eyes: Denies blurriness of vision Ears, nose, mouth, throat, and face: Denies mucositis or sore throat Respiratory: Denies cough, dyspnea or wheezes Cardiovascular: Denies palpitation, chest discomfort or lower extremity swelling Gastrointestinal:  Denies nausea, heartburn or change in bowel habits Skin: Denies abnormal skin rashes Lymphatics: Denies new lymphadenopathy or easy bruising Neurological:Denies numbness, tingling or new weaknesses Behavioral/Psych:  Mood is stable, no new changes  Breast:  denies any pain in either breasts All other systems were reviewed with the patient and are negative.  I have reviewed the past medical history, past surgical history, social history and family history with the patient and they are unchanged from previous note.  ALLERGIES:  is allergic to codeine; sulfa antibiotics; and penicillins.  MEDICATIONS:  Current Outpatient Prescriptions  Medication Sig Dispense Refill  . buPROPion (WELLBUTRIN SR) 150 MG 12 hr tablet Take 150 mg by mouth every evening.       . cetirizine (ZYRTEC) 10 MG tablet Take 10 mg by mouth daily.      . fluticasone (FLONASE) 50 MCG/ACT nasal spray Place into both nostrils as needed for allergies or rhinitis.      Marland Kitchen HYDROcodone-acetaminophen (NORCO/VICODIN) 5-325 MG per tablet Take 1-2 tablets by mouth every 4 (four) hours as needed for moderate pain or severe pain.  20 tablet  0  . simvastatin (ZOCOR) 20 MG tablet Take 20 mg by mouth daily at 6 PM.       . dexamethasone (DECADRON) 4 MG tablet Take 2 tablets (8 mg total) by mouth 2 (two) times daily. Start the day before Taxotere. Then again the day after chemo for 3 days.  30 tablet  1  . lidocaine-prilocaine (EMLA) cream Apply 1 application topically as needed.  30 g  0  . lidocaine-prilocaine (EMLA) cream Apply 1 application topically as needed.  30 g  6  . LORazepam (ATIVAN) 0.5 MG tablet Take 1 tablet (0.5 mg total) by mouth every 6 (six) hours as needed (Nausea or vomiting).  30 tablet  0  .  ondansetron (ZOFRAN) 8 MG tablet Take 1 tablet (8 mg total) by mouth 2 (two) times daily. Start the day after chemo for 3 days. Then take as needed for nausea or vomiting.  30 tablet  1  . prochlorperazine (COMPAZINE) 10 MG tablet Take 1 tablet (10 mg total) by mouth every 6 (six) hours as needed (Nausea or vomiting).  30 tablet  1   No current facility-administered medications for this visit.   Facility-Administered Medications Ordered in Other  Visits  Medication Dose Route Frequency Provider Last Rate Last Dose  . [START ON 08/21/2014] Influenza vac split quadrivalent PF (FLUARIX) injection 0.5 mL  0.5 mL Intramuscular Tomorrow-1000 Rulon Eisenmenger, MD        PHYSICAL EXAMINATION: ECOG PERFORMANCE STATUS: 1 - Symptomatic but completely ambulatory  Filed Vitals:   08/20/14 1436  BP: 136/81  Pulse: 99  Temp: 98.5 F (36.9 C)  Resp: 19   Filed Weights   08/20/14 1436  Weight: 200 lb 11.2 oz (91.037 kg)    GENERAL:alert, no distress and comfortable ; port site is intact and healing well SKIN: skin color, texture, turgor are normal, no rashes or significant lesions EYES: normal, Conjunctiva are pink and non-injected, sclera clear OROPHARYNX:no exudate, no erythema and lips, buccal mucosa, and tongue normal  NECK: supple, thyroid normal size, non-tender, without nodularity LYMPH:  no palpable lymphadenopathy in the cervical, axillary or inguinal LUNGS: clear to auscultation and percussion with normal breathing effort HEART: regular rate & rhythm and no murmurs and no lower extremity edema ABDOMEN:abdomen soft, non-tender and normal bowel sounds Musculoskeletal:no cyanosis of digits and no clubbing  NEURO: alert & oriented x 3 with fluent speech, no focal motor/sensory deficits   LABORATORY DATA:  I have reviewed the data as listed   Chemistry      Component Value Date/Time   NA 143 08/11/2014 0850   K 3.9 08/11/2014 0850   CO2 23 08/11/2014 0850   BUN 14.5 08/11/2014 0850   CREATININE 0.9 08/11/2014 0850      Component Value Date/Time   CALCIUM 9.8 08/11/2014 0850   ALKPHOS 113 08/11/2014 0850   AST 17 08/11/2014 0850   ALT 19 08/11/2014 0850   BILITOT 0.39 08/11/2014 0850       Lab Results  Component Value Date   WBC 8.1 08/20/2014   HGB 13.7 08/20/2014   HCT 41.9 08/20/2014   MCV 89.0 08/20/2014   PLT 238 08/20/2014   NEUTROABS 4.7 08/20/2014   ASSESSMENT & PLAN:  Breast cancer of upper-outer  quadrant of left female breast Left breast invasive ductal carcinoma grade 3 ER 0% PR 0% HER-2 positive ratio 3.74, Ki-67 90%: 2.3 cm mass by ultrasound clinical stage: T2, N0, M0 stage II A. Right breast abnormality detected through MRI, MRI guided biopsy to be done next Tuesday.  Treatment plan: Neoadjuvant chemotherapy with Taxotere, carboplatin, Herceptin, Perjeta every 3 weeks x6 cycles of Herceptin maintenance for one year. Patient had gone through chemotherapy patient class today. Her echocardiogram showed an EF of 60-65%. Patient is set up for chemotherapy to start on October 30. She will be followed up weekly at her to see my last practitioner Nira Conn for toxicity evaluation.  The patient signed the consent form and we will see her back for chemotherapy.  No orders of the defined types were placed in this encounter.   The patient has a good understanding of the overall plan. she agrees with it. She will call with any problems that  may develop before her next visit here.  I spent 20 minutes counseling the patient face to face. The total time spent in the appointment was 25 minutes and more than 50% was on counseling and review of test results    Rulon Eisenmenger, MD 08/20/2014 3:39 PM

## 2014-08-23 ENCOUNTER — Telehealth: Payer: Self-pay | Admitting: Oncology

## 2014-08-23 ENCOUNTER — Telehealth: Payer: Self-pay | Admitting: *Deleted

## 2014-08-23 ENCOUNTER — Ambulatory Visit: Payer: 59

## 2014-08-23 ENCOUNTER — Other Ambulatory Visit: Payer: 59

## 2014-08-23 NOTE — Telephone Encounter (Signed)
Per staff message and POF I have scheduled appts. Advised scheduler of appts. JMW  

## 2014-08-23 NOTE — Telephone Encounter (Signed)
, °

## 2014-08-24 ENCOUNTER — Ambulatory Visit
Admission: RE | Admit: 2014-08-24 | Discharge: 2014-08-24 | Disposition: A | Discharge status: Home / Self Care | Payer: 59 | Source: Ambulatory Visit | Attending: Hematology and Oncology | Admitting: Hematology and Oncology

## 2014-08-24 ENCOUNTER — Ambulatory Visit: Payer: 59

## 2014-08-24 DIAGNOSIS — C50412 Malignant neoplasm of upper-outer quadrant of left female breast: Secondary | ICD-10-CM

## 2014-08-24 DIAGNOSIS — R928 Other abnormal and inconclusive findings on diagnostic imaging of breast: Secondary | ICD-10-CM

## 2014-08-24 MED ORDER — GADOBENATE DIMEGLUMINE 529 MG/ML IV SOLN
18.0000 mL | Freq: Once | INTRAVENOUS | Status: AC | PRN
  Administered 2014-08-24: 18 mL via INTRAVENOUS

## 2014-08-26 ENCOUNTER — Other Ambulatory Visit: Payer: Self-pay | Admitting: Hematology and Oncology

## 2014-08-27 ENCOUNTER — Ambulatory Visit (HOSPITAL_BASED_OUTPATIENT_CLINIC_OR_DEPARTMENT_OTHER): Payer: 59

## 2014-08-27 VITALS — BP 151/83 | HR 91 | Temp 98.3°F | Resp 16

## 2014-08-27 DIAGNOSIS — Z5112 Encounter for antineoplastic immunotherapy: Secondary | ICD-10-CM

## 2014-08-27 DIAGNOSIS — C50412 Malignant neoplasm of upper-outer quadrant of left female breast: Secondary | ICD-10-CM

## 2014-08-27 DIAGNOSIS — Z5111 Encounter for antineoplastic chemotherapy: Secondary | ICD-10-CM

## 2014-08-27 DIAGNOSIS — C50212 Malignant neoplasm of upper-inner quadrant of left female breast: Secondary | ICD-10-CM

## 2014-08-27 MED ORDER — DOCETAXEL CHEMO INJECTION 160 MG/16ML
75.0000 mg/m2 | Freq: Once | INTRAVENOUS | Status: AC
  Administered 2014-08-27: 150 mg via INTRAVENOUS
  Filled 2014-08-27: qty 15

## 2014-08-27 MED ORDER — HEPARIN SOD (PORK) LOCK FLUSH 100 UNIT/ML IV SOLN
500.0000 [IU] | Freq: Once | INTRAVENOUS | Status: AC | PRN
  Administered 2014-08-27: 500 [IU]
  Filled 2014-08-27: qty 5

## 2014-08-27 MED ORDER — SODIUM CHLORIDE 0.9 % IV SOLN
150.0000 mg | Freq: Once | INTRAVENOUS | Status: AC
  Administered 2014-08-27: 150 mg via INTRAVENOUS
  Filled 2014-08-27: qty 5

## 2014-08-27 MED ORDER — DIPHENHYDRAMINE HCL 25 MG PO CAPS
ORAL_CAPSULE | ORAL | Status: AC
  Filled 2014-08-27: qty 2

## 2014-08-27 MED ORDER — DIPHENHYDRAMINE HCL 25 MG PO CAPS
50.0000 mg | ORAL_CAPSULE | Freq: Once | ORAL | Status: AC
  Administered 2014-08-27: 50 mg via ORAL

## 2014-08-27 MED ORDER — SODIUM CHLORIDE 0.9 % IV SOLN
Freq: Once | INTRAVENOUS | Status: AC
  Administered 2014-08-27: 10:00:00 via INTRAVENOUS

## 2014-08-27 MED ORDER — DEXAMETHASONE SODIUM PHOSPHATE 20 MG/5ML IJ SOLN
INTRAMUSCULAR | Status: AC
  Filled 2014-08-27: qty 5

## 2014-08-27 MED ORDER — ACETAMINOPHEN 325 MG PO TABS
650.0000 mg | ORAL_TABLET | Freq: Once | ORAL | Status: AC
  Administered 2014-08-27: 650 mg via ORAL

## 2014-08-27 MED ORDER — ONDANSETRON 16 MG/50ML IVPB (CHCC)
INTRAVENOUS | Status: AC
  Filled 2014-08-27: qty 16

## 2014-08-27 MED ORDER — TRASTUZUMAB CHEMO INJECTION 440 MG
8.0000 mg/kg | Freq: Once | INTRAVENOUS | Status: AC
  Administered 2014-08-27: 735 mg via INTRAVENOUS
  Filled 2014-08-27: qty 35

## 2014-08-27 MED ORDER — ONDANSETRON 16 MG/50ML IVPB (CHCC)
16.0000 mg | Freq: Once | INTRAVENOUS | Status: AC
  Administered 2014-08-27: 16 mg via INTRAVENOUS

## 2014-08-27 MED ORDER — DEXAMETHASONE SODIUM PHOSPHATE 20 MG/5ML IJ SOLN
12.0000 mg | Freq: Once | INTRAMUSCULAR | Status: AC
  Administered 2014-08-27: 12 mg via INTRAVENOUS

## 2014-08-27 MED ORDER — ACETAMINOPHEN 325 MG PO TABS
ORAL_TABLET | ORAL | Status: AC
  Filled 2014-08-27: qty 2

## 2014-08-27 MED ORDER — PERTUZUMAB CHEMO INJECTION 420 MG/14ML
840.0000 mg | Freq: Once | INTRAVENOUS | Status: AC
  Administered 2014-08-27: 840 mg via INTRAVENOUS
  Filled 2014-08-27: qty 28

## 2014-08-27 MED ORDER — SODIUM CHLORIDE 0.9 % IJ SOLN
10.0000 mL | INTRAMUSCULAR | Status: DC | PRN
  Administered 2014-08-27: 10 mL
  Filled 2014-08-27: qty 10

## 2014-08-27 MED ORDER — SODIUM CHLORIDE 0.9 % IV SOLN
700.0000 mg | Freq: Once | INTRAVENOUS | Status: AC
  Administered 2014-08-27: 700 mg via INTRAVENOUS
  Filled 2014-08-27: qty 70

## 2014-08-27 NOTE — Patient Instructions (Signed)
[facility] Discharge Instructions for Patients Receiving Chemotherapy  Today you received the following chemotherapy agents Herceptin, Perjeta, Taxotere and Carboplatin.  To help prevent nausea and vomiting after your treatment, we encourage you to take your nausea medication as prescribed.   If you develop nausea and vomiting that is not controlled by your nausea medication, call the clinic.   BELOW ARE SYMPTOMS THAT SHOULD BE REPORTED IMMEDIATELY:  *FEVER GREATER THAN 100.5 F  *CHILLS WITH OR WITHOUT FEVER  NAUSEA AND VOMITING THAT IS NOT CONTROLLED WITH YOUR NAUSEA MEDICATION  *UNUSUAL SHORTNESS OF BREATH  *UNUSUAL BRUISING OR BLEEDING  TENDERNESS IN MOUTH AND THROAT WITH OR WITHOUT PRESENCE OF ULCERS  *URINARY PROBLEMS  *BOWEL PROBLEMS  UNUSUAL RASH Items with * indicate a potential emergency and should be followed up as soon as possible.  Feel free to call the clinic you have any questions or concerns. The clinic phone number is [phone].    

## 2014-08-28 ENCOUNTER — Ambulatory Visit (HOSPITAL_BASED_OUTPATIENT_CLINIC_OR_DEPARTMENT_OTHER): Payer: 59

## 2014-08-28 VITALS — BP 151/79 | HR 90 | Temp 97.3°F | Resp 17

## 2014-08-28 DIAGNOSIS — C50212 Malignant neoplasm of upper-inner quadrant of left female breast: Secondary | ICD-10-CM

## 2014-08-28 DIAGNOSIS — C50412 Malignant neoplasm of upper-outer quadrant of left female breast: Secondary | ICD-10-CM

## 2014-08-28 MED ORDER — PEGFILGRASTIM INJECTION 6 MG/0.6ML
6.0000 mg | Freq: Once | SUBCUTANEOUS | Status: AC
  Administered 2014-08-28: 6 mg via SUBCUTANEOUS

## 2014-08-28 MED ORDER — LIDOCAINE-PRILOCAINE 2.5-2.5 % EX CREA: 1.0000 "application " | TOPICAL_CREAM | CUTANEOUS | Status: DC | PRN

## 2014-08-28 NOTE — Patient Instructions (Signed)
Pegfilgrastim injection What is this medicine? PEGFILGRASTIM (peg fil GRA stim) is a long-acting granulocyte colony-stimulating factor that stimulates the growth of neutrophils, a type of white blood cell important in the body's fight against infection. It is used to reduce the incidence of fever and infection in patients with certain types of cancer who are receiving chemotherapy that affects the bone marrow. This medicine may be used for other purposes; ask your health care provider or pharmacist if you have questions. COMMON BRAND NAME(S): Neulasta What should I tell my health care provider before I take this medicine? They need to know if you have any of these conditions: -latex allergy -ongoing radiation therapy -sickle cell disease -skin reactions to acrylic adhesives (On-Body Injector only) -an unusual or allergic reaction to pegfilgrastim, filgrastim, other medicines, foods, dyes, or preservatives -pregnant or trying to get pregnant -breast-feeding How should I use this medicine? This medicine is for injection under the skin. If you get this medicine at home, you will be taught how to prepare and give the pre-filled syringe or how to use the On-body Injector. Refer to the patient Instructions for Use for detailed instructions. Use exactly as directed. Take your medicine at regular intervals. Do not take your medicine more often than directed. It is important that you put your used needles and syringes in a special sharps container. Do not put them in a trash can. If you do not have a sharps container, call your pharmacist or healthcare provider to get one. Talk to your pediatrician regarding the use of this medicine in children. Special care may be needed. Overdosage: If you think you have taken too much of this medicine contact a poison control center or emergency room at once. NOTE: This medicine is only for you. Do not share this medicine with others. What if I miss a dose? It is  important not to miss your dose. Call your doctor or health care professional if you miss your dose. If you miss a dose due to an On-body Injector failure or leakage, a new dose should be administered as soon as possible using a single prefilled syringe for manual use. What may interact with this medicine? Interactions have not been studied. Give your health care provider a list of all the medicines, herbs, non-prescription drugs, or dietary supplements you use. Also tell them if you smoke, drink alcohol, or use illegal drugs. Some items may interact with your medicine. This list may not describe all possible interactions. Give your health care provider a list of all the medicines, herbs, non-prescription drugs, or dietary supplements you use. Also tell them if you smoke, drink alcohol, or use illegal drugs. Some items may interact with your medicine. What should I watch for while using this medicine? You may need blood work done while you are taking this medicine. If you are going to need a MRI, CT scan, or other procedure, tell your doctor that you are using this medicine (On-Body Injector only). What side effects may I notice from receiving this medicine? Side effects that you should report to your doctor or health care professional as soon as possible: -allergic reactions like skin rash, itching or hives, swelling of the face, lips, or tongue -dizziness -fever -pain, redness, or irritation at site where injected -pinpoint red spots on the skin -shortness of breath or breathing problems -stomach or side pain, or pain at the shoulder -swelling -tiredness -trouble passing urine Side effects that usually do not require medical attention (report to your doctor   or health care professional if they continue or are bothersome): -bone pain -muscle pain This list may not describe all possible side effects. Call your doctor for medical advice about side effects. You may report side effects to FDA at  1-800-FDA-1088. Where should I keep my medicine? Keep out of the reach of children. Store pre-filled syringes in a refrigerator between 2 and 8 degrees C (36 and 46 degrees F). Do not freeze. Keep in carton to protect from light. Throw away this medicine if it is left out of the refrigerator for more than 48 hours. Throw away any unused medicine after the expiration date. NOTE: This sheet is a summary. It may not cover all possible information. If you have questions about this medicine, talk to your doctor, pharmacist, or health care provider.  2015, Elsevier/Gold Standard. (2014-01-14 16:14:05)  

## 2014-08-31 ENCOUNTER — Ambulatory Visit: Payer: 59 | Admitting: Hematology and Oncology

## 2014-09-01 ENCOUNTER — Ambulatory Visit: Payer: 59 | Admitting: Hematology and Oncology

## 2014-09-01 ENCOUNTER — Ambulatory Visit: Payer: 59

## 2014-09-01 ENCOUNTER — Other Ambulatory Visit: Payer: 59

## 2014-09-02 ENCOUNTER — Ambulatory Visit: Payer: 59

## 2014-09-02 ENCOUNTER — Other Ambulatory Visit: Payer: Self-pay | Admitting: Nurse Practitioner

## 2014-09-02 ENCOUNTER — Encounter: Payer: Self-pay | Admitting: Genetic Counselor

## 2014-09-02 DIAGNOSIS — C50412 Malignant neoplasm of upper-outer quadrant of left female breast: Secondary | ICD-10-CM

## 2014-09-02 NOTE — Progress Notes (Signed)
GENETIC TEST RESULTS  Patient Name: Julie [name] Patient Age: 58 y.o. Encounter Date: 09/02/2014  Referring Physician: Nicholas Lose MD   Ms. Carmack was called today to discuss genetic test results. Please see the Genetics note from her visit on 08/12/14 for a detailed discussion of her personal and family history.  GENETIC TESTING: At the time of Ms. Zabinski's visit, we recommended she pursue genetic testing of multiple genes on the BreastNext gene panel. This test, which included sequencing and deletion/duplication analysis of 17 genes, was performed at [facility]. Testing was normal and did not reveal a mutation in these genes. The genes tested were ATM, BARD1, BRCA1, BRCA2, BRIP1, CDH1, CHEK2, MRE11A, MUTYH, NBN, NF1, PALB2, PTEN, RAD50, RAD51C, RAD51D, and TP53.  We discussed with Ms. Tangen that since the current test is not perfect, it is possible there may be a gene mutation that current testing cannot detect, but that chance is small. We also discussed that it is possible that a different genetic factor, which was not part of this testing or has not yet been discovered, is responsible for the cancer diagnoses in the family. Again, the likelihood of this is low. Should Ms. Talmadge wish to discuss or pursue this additional testing, we are happy to coordinate this at any time, but do not feel that she is at significant risk of harboring a mutation in a different gene.     CANCER SCREENING: This result suggests that Ms. Cockerill's cancer was most likely not due to an inherited predisposition. Most cancers happen by chance and this negative test, along with details of her family history, suggests that her cancer falls into this category. We, therefore, recommended she continue to follow the cancer screening guidelines provided by her physician.   FAMILY MEMBERS: Women in the family are at some increased risk of developing breast cancer, over the general population risk, simply due to the  family history. We recommended they have a yearly mammogram beginning at age 41, a yearly clinical breast exam, and perform monthly breast self-exams. A gynecologic exam is recommended yearly. Colon cancer screening is recommended to begin by age 20.  Lastly, we discussed with Ms. Stella that cancer genetics is a rapidly advancing field and it is possible that new genetic tests will be appropriate for her in the future. We encouraged her to remain in contact with [place] on an annual basis so we can update her personal and family histories, and let her know of advances in cancer genetics that may benefit the family. Our contact number was provided. Ms. Horwitz questions were answered to her satisfaction today, and she knows she is welcome to call anytime with additional questions.    Steele Berg, MS, [place] Certified Genetic Counselor phone: [phone] [email or web address]

## 2014-09-03 ENCOUNTER — Encounter: Payer: Self-pay | Admitting: Nurse Practitioner

## 2014-09-03 ENCOUNTER — Telehealth: Payer: Self-pay | Admitting: Nurse Practitioner

## 2014-09-03 ENCOUNTER — Ambulatory Visit (HOSPITAL_BASED_OUTPATIENT_CLINIC_OR_DEPARTMENT_OTHER): Payer: 59 | Admitting: Nurse Practitioner

## 2014-09-03 ENCOUNTER — Other Ambulatory Visit (HOSPITAL_BASED_OUTPATIENT_CLINIC_OR_DEPARTMENT_OTHER): Payer: 59

## 2014-09-03 VITALS — BP 117/73 | HR 90 | Temp 98.7°F | Resp 20 | Ht 62.0 in | Wt 192.3 lb

## 2014-09-03 DIAGNOSIS — C50412 Malignant neoplasm of upper-outer quadrant of left female breast: Secondary | ICD-10-CM

## 2014-09-03 DIAGNOSIS — C50212 Malignant neoplasm of upper-inner quadrant of left female breast: Secondary | ICD-10-CM

## 2014-09-03 DIAGNOSIS — R197 Diarrhea, unspecified: Secondary | ICD-10-CM

## 2014-09-03 DIAGNOSIS — B3731 Acute candidiasis of vulva and vagina: Secondary | ICD-10-CM

## 2014-09-03 DIAGNOSIS — Z170 Estrogen receptor positive status [ER+]: Secondary | ICD-10-CM

## 2014-09-03 DIAGNOSIS — B373 Candidiasis of vulva and vagina: Secondary | ICD-10-CM

## 2014-09-03 DIAGNOSIS — K1231 Oral mucositis (ulcerative) due to antineoplastic therapy: Secondary | ICD-10-CM

## 2014-09-03 LAB — CBC WITH DIFFERENTIAL/PLATELET
BASO%: 0.2 % (ref 0.0–2.0)
BASOS ABS: 0 10*3/uL (ref 0.0–0.1)
EOS ABS: 0.1 10*3/uL (ref 0.0–0.5)
EOS%: 1 % (ref 0.0–7.0)
HCT: 42.8 % (ref 34.8–46.6)
HGB: 14.2 g/dL (ref 11.6–15.9)
LYMPH#: 1.6 10*3/uL (ref 0.9–3.3)
LYMPH%: 21.3 % (ref 14.0–49.7)
MCH: 29.4 pg (ref 25.1–34.0)
MCHC: 33 g/dL (ref 31.5–36.0)
MCV: 89 fL (ref 79.5–101.0)
MONO#: 1.9 10*3/uL — ABNORMAL HIGH (ref 0.1–0.9)
MONO%: 24.8 % — ABNORMAL HIGH (ref 0.0–14.0)
NEUT%: 52.7 % (ref 38.4–76.8)
NEUTROS ABS: 3.9 10*3/uL (ref 1.5–6.5)
Platelets: 118 10*3/uL — ABNORMAL LOW (ref 145–400)
RBC: 4.81 10*6/uL (ref 3.70–5.45)
RDW: 13.1 % (ref 11.2–14.5)
WBC: 7.5 10*3/uL (ref 3.9–10.3)

## 2014-09-03 LAB — COMPREHENSIVE METABOLIC PANEL (CC13)
ALT: 34 U/L (ref 0–55)
ANION GAP: 9 meq/L (ref 3–11)
AST: 19 U/L (ref 5–34)
Albumin: 3.5 g/dL (ref 3.5–5.0)
Alkaline Phosphatase: 139 U/L (ref 40–150)
BUN: 11.1 mg/dL (ref 7.0–26.0)
CALCIUM: 9.4 mg/dL (ref 8.4–10.4)
CHLORIDE: 107 meq/L (ref 98–109)
CO2: 24 meq/L (ref 22–29)
Creatinine: 0.9 mg/dL (ref 0.6–1.1)
GLUCOSE: 114 mg/dL (ref 70–140)
Potassium: 3.9 mEq/L (ref 3.5–5.1)
Sodium: 139 mEq/L (ref 136–145)
Total Bilirubin: 0.48 mg/dL (ref 0.20–1.20)
Total Protein: 7.1 g/dL (ref 6.4–8.3)

## 2014-09-03 MED ORDER — CHOLESTYRAMINE 4 G PO PACK: 4.0000 g | PACK | Freq: Three times a day (TID) | ORAL | Status: DC

## 2014-09-03 MED ORDER — MAGIC MOUTHWASH W/LIDOCAINE: 5.0000 mL | Freq: Four times a day (QID) | ORAL | Status: DC | PRN

## 2014-09-03 MED ORDER — LOPERAMIDE HCL 2 MG PO CAPS: 2.0000 mg | ORAL_CAPSULE | ORAL | Status: DC | PRN

## 2014-09-03 NOTE — Assessment & Plan Note (Signed)
History of recurrent yeast infections. Previously on progesterone cream as advised by gynecologist. Stopped when she found out about breast cancer. OTC metronidazole and PO diflucan have been unsuccessful treatments in past. Patient wonders if she could restart this cream, as her breast cancer is ER/PR neg. I will forward this question to Dr. Lindi Adie. The patient declined prescription for diflucan at this time.

## 2014-09-03 NOTE — Assessment & Plan Note (Addendum)
Patient experiencing 5-6 loose bowel movements x 3 days. Likely as the result of perjeta. Prescribed questran powder TIDPRN and imodium 2mg  PRN.  I encouraged her to stay hydrated, drinking at least 48-64oz daily to replace the fluids. Her electrolytes are stable at this time.

## 2014-09-03 NOTE — Assessment & Plan Note (Addendum)
Neoadjuvant chemotherapy with Taxotere, carboplatin, Herceptin, Perjeta every 3 weeks x6 cycles of Herceptin maintenance for one year.  Today is day 8, cycle 1 of this regimen. The labs were reviewed in detail and were stable. The patient is doing moderately well today. I advised her to take Claritin daily x 5 after her neulasta injections to alleviate the joint aches. In addition, I advised her it was safe to take OTC analgesics such as tylenol or ibuprofen.   Julie [name] will return in 2 weeks for the start of cycle 2.

## 2014-09-03 NOTE — Assessment & Plan Note (Signed)
Early stages of mucositis. Mouth and tongue are tender/but no mouth sores have formed. Called in prescription for Duke's Magic Mouthwash to patient's pharmacy, [facility].

## 2014-09-03 NOTE — Telephone Encounter (Signed)
, °

## 2014-09-03 NOTE — Progress Notes (Signed)
Patient Care Team: [name], MD as PCP - General (Internal Medicine)  DIAGNOSIS: Breast cancer of upper-outer quadrant of left female breast   Primary site: Breast (Left)   Staging method: AJCC 7th Edition   Clinical: Stage IIA (T2, N0, cM0)   Summary: Stage IIA (T2, N0, cM0)   Clinical comments: Staged at breast conference 08/11/14.   SUMMARY OF ONCOLOGIC HISTORY:   Breast cancer of upper-outer quadrant of left female breast   08/05/2014 Mammogram Left breast 10 o clock: 2.3X 2X1.4 cm lobular hypoechoeic   08/05/2014 Initial Biopsy Grade 3 IDC Er 0%, PR 0%, Her 2 positive Ratio 3.74; Ki 67: 90%   08/17/2014 Breast MRI Left breast known malignancy upper inner quadrant 2.6 cm: Suspicious non-mass enhancement upper-inner quadrant right breast biopsy pending    CHIEF COMPLIANT: neoadjuvant chemotherapy  INTERVAL HISTORY: Julie [name] is a 59 year old Caucasian lady with above-mentioned history of left-sided breast cancer that was ER/PR negative HER-2 positive. She returns today accompanied by her husband. Today is day 8, cycle 1 of carboplatin, docetaxel, herceptin, and perjeta. The patient tolerated her first round of chemotherapy moderately well with a few complaints. Starting this Wednesday she experienced intense abdominal cramping accompanied with loose watery diarrhea after eating. She has had about 6 loose bowel movements yesterday. She is remaining well hydrated despite the diarrhea. Her appetite is somewhat decreased. She was achy all day on Wednesday, likely as the result of her neulasta injection. Her tongue and buccal mucosa are sore. She is also experiencing a yeast infection. She has struggled with frequent yeast in the past and has seen a specialist who prescribed her a progesterone based cream, but she stopped when she found out about he breast cancer.   REVIEW OF SYSTEMS:   A detailed review of systems is otherwise noncontributory, except where noted above.   I have  reviewed the past medical history, past surgical history, social history and family history with the patient and they are unchanged from previous note.  ALLERGIES:  is allergic to codeine; sulfa antibiotics; and penicillins.  MEDICATIONS:  Current Outpatient Prescriptions  Medication Sig Dispense Refill  . Azelaic Acid (FINACEA) 15 % cream Apply topically 2 (two) times daily. After skin is thoroughly washed and patted dry, gently but thoroughly massage a thin film of azelaic acid cream into the affected area twice daily, in the morning and evening.    . metroNIDAZOLE (METROGEL) 1 % gel Apply topically daily.    . Alum & Mag Hydroxide-Simeth (MAGIC MOUTHWASH W/LIDOCAINE) SOLN Take 5 mLs by mouth 4 (four) times daily as needed for mouth pain. 240 mL 0  . buPROPion (WELLBUTRIN SR) 150 MG 12 hr tablet Take 150 mg by mouth every evening.     . cetirizine (ZYRTEC) 10 MG tablet Take 10 mg by mouth daily.    . cholestyramine (QUESTRAN) 4 G packet Take 1 packet (4 g total) by mouth 3 (three) times daily with meals. 60 each 3  . dexamethasone (DECADRON) 4 MG tablet Take 2 tablets (8 mg total) by mouth 2 (two) times daily. Start the day before Taxotere. Then again the day after chemo for 3 days. 30 tablet 1  . fluticasone (FLONASE) 50 MCG/ACT nasal spray Place into both nostrils as needed for allergies or rhinitis.    Marland Kitchen HYDROcodone-acetaminophen (NORCO/VICODIN) 5-325 MG per tablet Take 1-2 tablets by mouth every 4 (four) hours as needed for moderate pain or severe pain. 20 tablet 0  . lidocaine-prilocaine (EMLA) cream  Apply 1 application topically as needed. 30 g 0  . lidocaine-prilocaine (EMLA) cream Apply 1 application topically as needed. 30 g 6  . lidocaine-prilocaine (EMLA) cream Apply 1 application topically as needed. 30 g 6  . loperamide (IMODIUM) 2 MG capsule Take 1 capsule (2 mg total) by mouth as needed for diarrhea or loose stools. 30 capsule 2  . LORazepam (ATIVAN) 0.5 MG tablet Take 1 tablet  (0.5 mg total) by mouth every 6 (six) hours as needed (Nausea or vomiting). 30 tablet 0  . ondansetron (ZOFRAN) 8 MG tablet Take 1 tablet (8 mg total) by mouth 2 (two) times daily. Start the day after chemo for 3 days. Then take as needed for nausea or vomiting. 30 tablet 1  . prochlorperazine (COMPAZINE) 10 MG tablet Take 1 tablet (10 mg total) by mouth every 6 (six) hours as needed (Nausea or vomiting). 30 tablet 1  . simvastatin (ZOCOR) 20 MG tablet Take 20 mg by mouth daily at 6 PM.      No current facility-administered medications for this visit.   Facility-Administered Medications Ordered in Other Visits  Medication Dose Route Frequency Provider Last Rate Last Dose  . Influenza vac split quadrivalent PF (FLUARIX) injection 0.5 mL  0.5 mL Intramuscular Tomorrow-1000 Rulon Eisenmenger, MD        PHYSICAL EXAMINATION: ECOG PERFORMANCE STATUS: 1 - Symptomatic but completely ambulatory  Filed Vitals:   09/03/14 0838  BP: 117/73  Pulse: 90  Temp: 98.7 F (37.1 C)  Resp: 20   Filed Weights   09/03/14 0838  Weight: 192 lb 4.8 oz (87.227 kg)    Skin: warm, dry  HEENT: sclerae anicteric, conjunctivae pink. oropharynx erythematous, no true mouth sores located Lymph Nodes: No cervical or supraclavicular lymphadenopathy  Lungs: clear to auscultation bilaterally, no rales, wheezes, or rhonci  Heart: regular rate and rhythm  Abdomen: round, soft, non tender, positive bowel sounds  Musculoskeletal: No focal spinal tenderness, no peripheral edema  Neuro: non focal, well oriented, positive affect  Breasts: deferred  LABORATORY DATA:  I have reviewed the data as listed   Chemistry      Component Value Date/Time   NA 139 09/03/2014 0822   K 3.9 09/03/2014 0822   CO2 24 09/03/2014 0822   BUN 11.1 09/03/2014 0822   CREATININE 0.9 09/03/2014 0822      Component Value Date/Time   CALCIUM 9.4 09/03/2014 0822   ALKPHOS 139 09/03/2014 0822   AST 19 09/03/2014 0822   ALT 34 09/03/2014 0822    BILITOT 0.48 09/03/2014 0822       Lab Results  Component Value Date   WBC 7.5 09/03/2014   HGB 14.2 09/03/2014   HCT 42.8 09/03/2014   MCV 89.0 09/03/2014   PLT 118* 09/03/2014   NEUTROABS 3.9 09/03/2014   ASSESSMENT & PLAN:  Diarrhea Patient experiencing 5-6 loose bowel movements x 3 days. Likely as the result of perjeta. Prescribed questran powder TIDPRN and imodium 107m PRN.  I encouraged her to stay hydrated, drinking at least 48-64oz daily to replace the fluids. Her electrolytes are stable at this time.   Vaginal yeast infection History of recurrent yeast infections. Previously on progesterone cream as advised by gynecologist. Stopped when she found out about breast cancer. OTC metronidazole and PO diflucan have been unsuccessful treatments in past. Patient wonders if she could restart this cream, as her breast cancer is ER/PR neg. I will forward this question to Dr. GLindi Adie The patient declined prescription for  diflucan at this time.   Mucositis due to chemotherapy Early stages of mucositis. Mouth and tongue are tender/but no mouth sores have formed. Called in prescription for [employer] Magic Mouthwash to patient's pharmacy, [facility].   Breast cancer of upper-outer quadrant of left female breast Neoadjuvant chemotherapy with Taxotere, carboplatin, Herceptin, Perjeta every 3 weeks x6 cycles of Herceptin maintenance for one year.  Today is day 8, cycle 1 of this regimen. The labs were reviewed in detail and were stable. The patient is doing moderately well today. I advised her to take Claritin daily x 5 after her neulasta injections to alleviate the joint aches. In addition, I advised her it was safe to take OTC analgesics such as tylenol or ibuprofen.   Julie [name] will return in 2 weeks for the start of cycle 2.    Orders Placed This Encounter  Procedures  . CBC with Differential    Standing Status: Future     Number of Occurrences:      Standing Expiration Date: 09/03/2015  .  Comprehensive metabolic panel    Standing Status: Future     Number of Occurrences:      Standing Expiration Date: 09/03/2015   The patient has a good understanding of the overall plan. she agrees with it. She will call with any problems that may develop before her next visit here.  I spent 20 minutes counseling the patient face to face. The total time spent in the appointment was 25 minutes and more than 50% was on counseling and review of test results    Marcelino Duster, NP 09/03/2014 10:55 AM

## 2014-09-08 ENCOUNTER — Ambulatory Visit: Payer: 59 | Admitting: Nurse Practitioner

## 2014-09-08 ENCOUNTER — Other Ambulatory Visit: Payer: 59

## 2014-09-09 ENCOUNTER — Other Ambulatory Visit: Payer: Self-pay | Admitting: Obstetrics and Gynecology

## 2014-09-17 ENCOUNTER — Other Ambulatory Visit: Payer: Self-pay | Admitting: Hematology and Oncology

## 2014-09-17 ENCOUNTER — Ambulatory Visit (HOSPITAL_BASED_OUTPATIENT_CLINIC_OR_DEPARTMENT_OTHER): Payer: 59 | Admitting: Nurse Practitioner

## 2014-09-17 ENCOUNTER — Encounter: Payer: Self-pay | Admitting: Nurse Practitioner

## 2014-09-17 ENCOUNTER — Ambulatory Visit (HOSPITAL_BASED_OUTPATIENT_CLINIC_OR_DEPARTMENT_OTHER): Payer: 59

## 2014-09-17 ENCOUNTER — Other Ambulatory Visit (HOSPITAL_BASED_OUTPATIENT_CLINIC_OR_DEPARTMENT_OTHER): Payer: 59

## 2014-09-17 VITALS — BP 132/78 | HR 83 | Temp 98.0°F | Resp 18 | Ht 62.0 in | Wt 192.8 lb

## 2014-09-17 DIAGNOSIS — Z5111 Encounter for antineoplastic chemotherapy: Secondary | ICD-10-CM

## 2014-09-17 DIAGNOSIS — C50412 Malignant neoplasm of upper-outer quadrant of left female breast: Secondary | ICD-10-CM

## 2014-09-17 DIAGNOSIS — Z5112 Encounter for antineoplastic immunotherapy: Secondary | ICD-10-CM

## 2014-09-17 LAB — COMPREHENSIVE METABOLIC PANEL (CC13)
ALBUMIN: 3.8 g/dL (ref 3.5–5.0)
ALK PHOS: 112 U/L (ref 40–150)
ALT: 32 U/L (ref 0–55)
AST: 17 U/L (ref 5–34)
Anion Gap: 11 mEq/L (ref 3–11)
BUN: 18.5 mg/dL (ref 7.0–26.0)
CALCIUM: 10.1 mg/dL (ref 8.4–10.4)
CO2: 22 mEq/L (ref 22–29)
Chloride: 108 mEq/L (ref 98–109)
Creatinine: 0.9 mg/dL (ref 0.6–1.1)
Glucose: 147 mg/dl — ABNORMAL HIGH (ref 70–140)
POTASSIUM: 4 meq/L (ref 3.5–5.1)
Sodium: 142 mEq/L (ref 136–145)
TOTAL PROTEIN: 7.3 g/dL (ref 6.4–8.3)
Total Bilirubin: 0.31 mg/dL (ref 0.20–1.20)

## 2014-09-17 LAB — CBC WITH DIFFERENTIAL/PLATELET
BASO%: 0 % (ref 0.0–2.0)
BASOS ABS: 0 10*3/uL (ref 0.0–0.1)
EOS%: 0 % (ref 0.0–7.0)
Eosinophils Absolute: 0 10*3/uL (ref 0.0–0.5)
HCT: 36.9 % (ref 34.8–46.6)
HEMOGLOBIN: 12.6 g/dL (ref 11.6–15.9)
LYMPH%: 13.2 % — ABNORMAL LOW (ref 14.0–49.7)
MCH: 29.7 pg (ref 25.1–34.0)
MCHC: 34.1 g/dL (ref 31.5–36.0)
MCV: 87 fL (ref 79.5–101.0)
MONO#: 0.3 10*3/uL (ref 0.1–0.9)
MONO%: 3.4 % (ref 0.0–14.0)
NEUT#: 6.9 10*3/uL — ABNORMAL HIGH (ref 1.5–6.5)
NEUT%: 83.4 % — ABNORMAL HIGH (ref 38.4–76.8)
Platelets: 311 10*3/uL (ref 145–400)
RBC: 4.24 10*6/uL (ref 3.70–5.45)
RDW: 13.5 % (ref 11.2–14.5)
WBC: 8.3 10*3/uL (ref 3.9–10.3)
lymph#: 1.1 10*3/uL (ref 0.9–3.3)

## 2014-09-17 MED ORDER — DIPHENHYDRAMINE HCL 25 MG PO CAPS
ORAL_CAPSULE | ORAL | Status: AC
  Filled 2014-09-17: qty 2

## 2014-09-17 MED ORDER — SODIUM CHLORIDE 0.9 % IV SOLN
420.0000 mg | Freq: Once | INTRAVENOUS | Status: AC
  Administered 2014-09-17: 420 mg via INTRAVENOUS
  Filled 2014-09-17: qty 14

## 2014-09-17 MED ORDER — DIPHENHYDRAMINE HCL 25 MG PO CAPS
50.0000 mg | ORAL_CAPSULE | Freq: Once | ORAL | Status: AC
  Administered 2014-09-17: 50 mg via ORAL

## 2014-09-17 MED ORDER — SODIUM CHLORIDE 0.9 % IJ SOLN
10.0000 mL | INTRAMUSCULAR | Status: DC | PRN
  Administered 2014-09-17: 10 mL
  Filled 2014-09-17: qty 10

## 2014-09-17 MED ORDER — ONDANSETRON 16 MG/50ML IVPB (CHCC)
16.0000 mg | Freq: Once | INTRAVENOUS | Status: AC
  Administered 2014-09-17: 16 mg via INTRAVENOUS

## 2014-09-17 MED ORDER — ACETAMINOPHEN 325 MG PO TABS
ORAL_TABLET | ORAL | Status: AC
  Filled 2014-09-17: qty 2

## 2014-09-17 MED ORDER — HEPARIN SOD (PORK) LOCK FLUSH 100 UNIT/ML IV SOLN
500.0000 [IU] | Freq: Once | INTRAVENOUS | Status: AC | PRN
  Administered 2014-09-17: 500 [IU]
  Filled 2014-09-17: qty 5

## 2014-09-17 MED ORDER — SODIUM CHLORIDE 0.9 % IV SOLN: Freq: Once | INTRAVENOUS | Status: DC

## 2014-09-17 MED ORDER — TRASTUZUMAB CHEMO INJECTION 440 MG
6.0000 mg/kg | Freq: Once | INTRAVENOUS | Status: AC
  Administered 2014-09-17: 546 mg via INTRAVENOUS
  Filled 2014-09-17: qty 26

## 2014-09-17 MED ORDER — DEXAMETHASONE SODIUM PHOSPHATE 20 MG/5ML IJ SOLN
INTRAMUSCULAR | Status: AC
Start: 2014-09-17 — End: 2014-09-17
  Filled 2014-09-17: qty 5

## 2014-09-17 MED ORDER — DOCETAXEL CHEMO INJECTION 160 MG/16ML
75.0000 mg/m2 | Freq: Once | INTRAVENOUS | Status: AC
  Administered 2014-09-17: 150 mg via INTRAVENOUS
  Filled 2014-09-17: qty 15

## 2014-09-17 MED ORDER — SODIUM CHLORIDE 0.9 % IV SOLN
Freq: Once | INTRAVENOUS | Status: AC
  Administered 2014-09-17: 09:00:00 via INTRAVENOUS

## 2014-09-17 MED ORDER — SODIUM CHLORIDE 0.9 % IV SOLN
700.0000 mg | Freq: Once | INTRAVENOUS | Status: AC
  Administered 2014-09-17: 700 mg via INTRAVENOUS
  Filled 2014-09-17: qty 70

## 2014-09-17 MED ORDER — ONDANSETRON 16 MG/50ML IVPB (CHCC)
INTRAVENOUS | Status: AC
  Filled 2014-09-17: qty 16

## 2014-09-17 MED ORDER — ACETAMINOPHEN 325 MG PO TABS
650.0000 mg | ORAL_TABLET | Freq: Once | ORAL | Status: AC
  Administered 2014-09-17: 650 mg via ORAL

## 2014-09-17 MED ORDER — SODIUM CHLORIDE 0.9 % IV SOLN
150.0000 mg | Freq: Once | INTRAVENOUS | Status: AC
  Administered 2014-09-17: 150 mg via INTRAVENOUS
  Filled 2014-09-17: qty 5

## 2014-09-17 MED ORDER — DEXAMETHASONE SODIUM PHOSPHATE 20 MG/5ML IJ SOLN
12.0000 mg | Freq: Once | INTRAMUSCULAR | Status: AC
  Administered 2014-09-17: 12 mg via INTRAVENOUS

## 2014-09-17 NOTE — Progress Notes (Signed)
Patient Care Team: [name], MD as PCP - General (Internal Medicine)  DIAGNOSIS: Breast cancer of upper-outer quadrant of left female breast   Primary site: Breast (Left)   Staging method: AJCC 7th Edition   Clinical: Stage IIA (T2, N0, cM0)   Summary: Stage IIA (T2, N0, cM0)   Clinical comments: Staged at breast conference 08/11/14.   SUMMARY OF ONCOLOGIC HISTORY:   Breast cancer of upper-outer quadrant of left female breast   08/05/2014 Mammogram Left breast 10 o clock: 2.3X 2X1.4 cm lobular hypoechoeic   08/05/2014 Initial Biopsy Grade 3 IDC Er 0%, PR 0%, Her 2 positive Ratio 3.74; Ki 67: 90%   08/17/2014 Breast MRI Left breast known malignancy upper inner quadrant 2.6 cm: Suspicious non-mass enhancement upper-inner quadrant right breast biopsy pending   08/27/2014 -  Chemotherapy Neoadjuvant chemotherapy with Taxotere, carboplatin, Herceptin, Perjeta every 3 weeks x6 cycles of Herceptin maintenance for one year.    CHIEF COMPLIANT: neoadjuvant chemotherapy  INTERVAL HISTORY: Julie [name] is a 58 year old Caucasian lady with above-mentioned history of left-sided breast cancer that was ER/PR negative HER-2 positive. She returns today accompanied by her husband. Today is day 1, cycle 2 of carboplatin, docetaxel, herceptin, and perjeta. Since starting on the imodium and questran powder her bowel movements are much improved. She is now having soft formed stools daily. Her appetite is decreased and her sense of smell has changed to make foods she once enjoyed, undesirable. She is trying to stay well hydrated. The magic mouthwash has resolved her mucositis. She visited her gynecologist who prescribed her diflucan x 3 days which cleared her vaginal yeast. He also performed a pap smear and "abnormal cells" were found, so she has a follow up visit with him planned later this month.   REVIEW OF SYSTEMS:   A detailed review of systems is otherwise noncontributory, except where noted above.    I have reviewed the past medical history, past surgical history, social history and family history with the patient and they are unchanged from previous note.  ALLERGIES:  is allergic to codeine; sulfa antibiotics; and penicillins.  MEDICATIONS:  Current Outpatient Prescriptions  Medication Sig Dispense Refill  . Azelaic Acid (FINACEA) 15 % cream Apply topically daily. After skin is thoroughly washed and patted dry, gently but thoroughly massage a thin film of azelaic acid cream into the affected area twice daily, in the morning and evening.    Marland Kitchen buPROPion (WELLBUTRIN SR) 150 MG 12 hr tablet Take 150 mg by mouth every evening.     . cetirizine (ZYRTEC) 10 MG tablet Take 10 mg by mouth daily.    . cholestyramine (QUESTRAN) 4 G packet Take 1 packet (4 g total) by mouth 3 (three) times daily with meals. 60 each 3  . dexamethasone (DECADRON) 4 MG tablet Take 2 tablets (8 mg total) by mouth 2 (two) times daily. Start the day before Taxotere. Then again the day after chemo for 3 days. 30 tablet 1  . fluticasone (FLONASE) 50 MCG/ACT nasal spray Place into both nostrils as needed for allergies or rhinitis.    Marland Kitchen lidocaine-prilocaine (EMLA) cream Apply 1 application topically as needed. 30 g 6  . LORazepam (ATIVAN) 0.5 MG tablet Take 1 tablet (0.5 mg total) by mouth every 6 (six) hours as needed (Nausea or vomiting). 30 tablet 0  . metroNIDAZOLE (METROGEL) 1 % gel Apply topically daily.    . ondansetron (ZOFRAN) 8 MG tablet Take 1 tablet (8 mg total) by mouth 2 (  two) times daily. Start the day after chemo for 3 days. Then take as needed for nausea or vomiting. 30 tablet 1  . prochlorperazine (COMPAZINE) 10 MG tablet Take 1 tablet (10 mg total) by mouth every 6 (six) hours as needed (Nausea or vomiting). 30 tablet 1  . simvastatin (ZOCOR) 20 MG tablet Take 20 mg by mouth daily at 6 PM.     . Alum & Mag Hydroxide-Simeth (MAGIC MOUTHWASH W/LIDOCAINE) SOLN Take 5 mLs by mouth 4 (four) times daily as needed  for mouth pain. 240 mL 0  . HYDROcodone-acetaminophen (NORCO/VICODIN) 5-325 MG per tablet Take 1-2 tablets by mouth every 4 (four) hours as needed for moderate pain or severe pain. 20 tablet 0  . loperamide (IMODIUM) 2 MG capsule Take 1 capsule (2 mg total) by mouth as needed for diarrhea or loose stools. 30 capsule 2   No current facility-administered medications for this visit.   Facility-Administered Medications Ordered in Other Visits  Medication Dose Route Frequency Provider Last Rate Last Dose  . Influenza vac split quadrivalent PF (FLUARIX) injection 0.5 mL  0.5 mL Intramuscular Tomorrow-1000 Rulon Eisenmenger, MD        PHYSICAL EXAMINATION: ECOG PERFORMANCE STATUS: 1 - Symptomatic but completely ambulatory  Filed Vitals:   09/17/14 0858  BP: 132/78  Pulse: 83  Temp: 98 F (36.7 C)  Resp: 18   Filed Weights   09/17/14 0858  Weight: 192 lb 12.8 oz (87.454 kg)    Sclerae unicteric, pupils equal and reactive Oropharynx clear and moist-- no thrush No cervical or supraclavicular adenopathy Lungs no rales or rhonchi Heart regular rate and rhythm Abd soft, nontender, positive bowel sounds MSK no focal spinal tenderness, no upper extremity lymphedema Neuro: nonfocal, well oriented, appropriate affect Breasts: deferred  LABORATORY DATA:  I have reviewed the data as listed   Chemistry      Component Value Date/Time   NA 142 09/17/2014 0813   K 4.0 09/17/2014 0813   CO2 22 09/17/2014 0813   BUN 18.5 09/17/2014 0813   CREATININE 0.9 09/17/2014 0813      Component Value Date/Time   CALCIUM 10.1 09/17/2014 0813   ALKPHOS 112 09/17/2014 0813   AST 17 09/17/2014 0813   ALT 32 09/17/2014 0813   BILITOT 0.31 09/17/2014 0813       Lab Results  Component Value Date   WBC 8.3 09/17/2014   HGB 12.6 09/17/2014   HCT 36.9 09/17/2014   MCV 87.0 09/17/2014   PLT 311 09/17/2014   NEUTROABS 6.9* 09/17/2014   ASSESSMENT & PLAN:  Breast cancer of upper-outer quadrant of left  female breast Neoadjuvant chemotherapy with Taxotere, carboplatin, Herceptin, Perjeta every 3 weeks x6 cycles of Herceptin maintenance for one year.  Julie [name] is doing well today. The labs were reviewed in detail. We will proceed with day 1, cycle 2 of the prescribed chemo today. She will continue to use her anti-diarrheals as needed and still has magic mouthwash on hand if the mucositis recurs.   Julie [name] will return on 11/30 for her nadir visit.    Orders Placed This Encounter  Procedures  . CBC with Differential    Standing Status: Future     Number of Occurrences:      Standing Expiration Date: 09/17/2015  . Comprehensive metabolic panel    Standing Status: Future     Number of Occurrences:      Standing Expiration Date: 09/17/2015   The patient has a good understanding of the  overall plan. she agrees with it. She will call with any problems that may develop before her next visit here.  I spent 10 minutes counseling the patient face to face. The total time spent in the appointment was 15 minutes and more than 50% was on counseling and review of test results    Marcelino Duster, NP 09/17/2014 9:07 AM

## 2014-09-17 NOTE — Assessment & Plan Note (Signed)
Neoadjuvant chemotherapy with Taxotere, carboplatin, Herceptin, Perjeta every 3 weeks x6 cycles of Herceptin maintenance for one year.  Julie [name] is doing well today. The labs were reviewed in detail. We will proceed with day 1, cycle 2 of the prescribed chemo today. She will continue to use her anti-diarrheals as needed and still has magic mouthwash on hand if the mucositis recurs.   Julie [name] will return on 11/30 for her nadir visit.

## 2014-09-17 NOTE — Patient Instructions (Signed)
[facility] Discharge Instructions for Patients Receiving Chemotherapy  Today you received the following chemotherapy agents: taxotere, carboplatin, herceptin, perjeta  To help prevent nausea and vomiting after your treatment, we encourage you to take your nausea medication.  Take it as often as prescribed.     If you develop nausea and vomiting that is not controlled by your nausea medication, call the clinic. If it is after clinic hours your family physician or the after hours number for the clinic or go to the Emergency Department.   BELOW ARE SYMPTOMS THAT SHOULD BE REPORTED IMMEDIATELY:  *FEVER GREATER THAN 100.5 F  *CHILLS WITH OR WITHOUT FEVER  NAUSEA AND VOMITING THAT IS NOT CONTROLLED WITH YOUR NAUSEA MEDICATION  *UNUSUAL SHORTNESS OF BREATH  *UNUSUAL BRUISING OR BLEEDING  TENDERNESS IN MOUTH AND THROAT WITH OR WITHOUT PRESENCE OF ULCERS  *URINARY PROBLEMS  *BOWEL PROBLEMS  UNUSUAL RASH Items with * indicate a potential emergency and should be followed up as soon as possible.  Feel free to call the clinic you have any questions or concerns. The clinic phone number is [phone].   I have been informed and understand all the instructions given to me. I know to contact the clinic, my physician, or go to the Emergency Department if any problems should occur. I do not have any questions at this time, but understand that I may call the clinic during office hours   should I have any questions or need assistance in obtaining follow up care.    __________________________________________  _____________  __________ Signature of Patient or Authorized Representative            Date                   Time    __________________________________________ Nurse's Signature    

## 2014-09-17 NOTE — Progress Notes (Signed)
Dory Peru dietician seeing patient and requested aromatherapy for patient to help with food smells that interfere with her ability to eat.  Gave patient Ginger-Orange oil and Lemongrass oil and she prefers the lemongrass sample.  She thinks this will help her to eat when the smell of food makes it difficult to eat.  Will give her information on websites and local store to purchase essential oils.

## 2014-09-18 ENCOUNTER — Ambulatory Visit (HOSPITAL_BASED_OUTPATIENT_CLINIC_OR_DEPARTMENT_OTHER): Payer: 59

## 2014-09-18 DIAGNOSIS — Z5189 Encounter for other specified aftercare: Secondary | ICD-10-CM

## 2014-09-18 DIAGNOSIS — C50212 Malignant neoplasm of upper-inner quadrant of left female breast: Secondary | ICD-10-CM

## 2014-09-18 DIAGNOSIS — C50412 Malignant neoplasm of upper-outer quadrant of left female breast: Secondary | ICD-10-CM

## 2014-09-18 MED ORDER — PEGFILGRASTIM INJECTION 6 MG/0.6ML ~~LOC~~
6.0000 mg | PREFILLED_SYRINGE | Freq: Once | SUBCUTANEOUS | Status: AC
  Administered 2014-09-18: 6 mg via SUBCUTANEOUS

## 2014-09-18 NOTE — Patient Instructions (Signed)
Pegfilgrastim injection What is this medicine? PEGFILGRASTIM (peg fil GRA stim) is a long-acting granulocyte colony-stimulating factor that stimulates the growth of neutrophils, a type of white blood cell important in the body's fight against infection. It is used to reduce the incidence of fever and infection in patients with certain types of cancer who are receiving chemotherapy that affects the bone marrow. This medicine may be used for other purposes; ask your health care provider or pharmacist if you have questions. COMMON BRAND NAME(S): Neulasta What should I tell my health care provider before I take this medicine? They need to know if you have any of these conditions: -latex allergy -ongoing radiation therapy -sickle cell disease -skin reactions to acrylic adhesives (On-Body Injector only) -an unusual or allergic reaction to pegfilgrastim, filgrastim, other medicines, foods, dyes, or preservatives -pregnant or trying to get pregnant -breast-feeding How should I use this medicine? This medicine is for injection under the skin. If you get this medicine at home, you will be taught how to prepare and give the pre-filled syringe or how to use the On-body Injector. Refer to the patient Instructions for Use for detailed instructions. Use exactly as directed. Take your medicine at regular intervals. Do not take your medicine more often than directed. It is important that you put your used needles and syringes in a special sharps container. Do not put them in a trash can. If you do not have a sharps container, call your pharmacist or healthcare provider to get one. Talk to your pediatrician regarding the use of this medicine in children. Special care may be needed. Overdosage: If you think you have taken too much of this medicine contact a poison control center or emergency room at once. NOTE: This medicine is only for you. Do not share this medicine with others. What if I miss a dose? It is  important not to miss your dose. Call your doctor or health care professional if you miss your dose. If you miss a dose due to an On-body Injector failure or leakage, a new dose should be administered as soon as possible using a single prefilled syringe for manual use. What may interact with this medicine? Interactions have not been studied. Give your health care provider a list of all the medicines, herbs, non-prescription drugs, or dietary supplements you use. Also tell them if you smoke, drink alcohol, or use illegal drugs. Some items may interact with your medicine. This list may not describe all possible interactions. Give your health care provider a list of all the medicines, herbs, non-prescription drugs, or dietary supplements you use. Also tell them if you smoke, drink alcohol, or use illegal drugs. Some items may interact with your medicine. What should I watch for while using this medicine? You may need blood work done while you are taking this medicine. If you are going to need a MRI, CT scan, or other procedure, tell your doctor that you are using this medicine (On-Body Injector only). What side effects may I notice from receiving this medicine? Side effects that you should report to your doctor or health care professional as soon as possible: -allergic reactions like skin rash, itching or hives, swelling of the face, lips, or tongue -dizziness -fever -pain, redness, or irritation at site where injected -pinpoint red spots on the skin -shortness of breath or breathing problems -stomach or side pain, or pain at the shoulder -swelling -tiredness -trouble passing urine Side effects that usually do not require medical attention (report to your doctor   or health care professional if they continue or are bothersome): -bone pain -muscle pain This list may not describe all possible side effects. Call your doctor for medical advice about side effects. You may report side effects to FDA at  1-800-FDA-1088. Where should I keep my medicine? Keep out of the reach of children. Store pre-filled syringes in a refrigerator between 2 and 8 degrees C (36 and 46 degrees F). Do not freeze. Keep in carton to protect from light. Throw away this medicine if it is left out of the refrigerator for more than 48 hours. Throw away any unused medicine after the expiration date. NOTE: This sheet is a summary. It may not cover all possible information. If you have questions about this medicine, talk to your doctor, pharmacist, or health care provider.  2015, Elsevier/Gold Standard. (2014-01-14 16:14:05)  

## 2014-09-20 ENCOUNTER — Encounter: Payer: Self-pay | Admitting: Nurse Practitioner

## 2014-09-20 ENCOUNTER — Ambulatory Visit: Payer: 59 | Admitting: Adult Health

## 2014-09-20 ENCOUNTER — Telehealth: Payer: Self-pay | Admitting: *Deleted

## 2014-09-20 ENCOUNTER — Other Ambulatory Visit: Payer: Self-pay | Admitting: Nurse Practitioner

## 2014-09-20 ENCOUNTER — Other Ambulatory Visit: Payer: 59

## 2014-09-20 ENCOUNTER — Ambulatory Visit: Payer: 59

## 2014-09-20 DIAGNOSIS — J0191 Acute recurrent sinusitis, unspecified: Secondary | ICD-10-CM

## 2014-09-20 MED ORDER — LEVOFLOXACIN 500 MG PO TABS: 500.0000 mg | ORAL_TABLET | Freq: Every day | ORAL | Status: DC

## 2014-09-20 NOTE — Telephone Encounter (Signed)
Message left on this RN's VM for " Susanne Borders " from pt stating onset of " sinus infection and concerned because I do not want to be sick over the holiday ".  This RN returned call and noted prescription for levaquin prescribed with today's date per Susanne Borders.  This RN verified pt aware of above with pt replying " yes I left a message on her VM but then I also was checking my appointments and sent a message from my portal "  This RN informed pt to be aware that message sent via my chart portal are for non urgent concerns that may take over 1 business day to receive a response.  For more urgent concerns like today's situation best to call with concern.  Minna verbalized understanding with no additional concerns at this time.

## 2014-09-21 ENCOUNTER — Ambulatory Visit: Payer: 59

## 2014-09-22 ENCOUNTER — Ambulatory Visit: Payer: 59

## 2014-09-27 ENCOUNTER — Ambulatory Visit (HOSPITAL_BASED_OUTPATIENT_CLINIC_OR_DEPARTMENT_OTHER): Payer: 59 | Admitting: Hematology and Oncology

## 2014-09-27 ENCOUNTER — Telehealth: Payer: Self-pay | Admitting: Hematology and Oncology

## 2014-09-27 ENCOUNTER — Other Ambulatory Visit (HOSPITAL_BASED_OUTPATIENT_CLINIC_OR_DEPARTMENT_OTHER): Payer: 59

## 2014-09-27 DIAGNOSIS — C50212 Malignant neoplasm of upper-inner quadrant of left female breast: Secondary | ICD-10-CM

## 2014-09-27 DIAGNOSIS — C50412 Malignant neoplasm of upper-outer quadrant of left female breast: Secondary | ICD-10-CM

## 2014-09-27 DIAGNOSIS — J029 Acute pharyngitis, unspecified: Secondary | ICD-10-CM

## 2014-09-27 DIAGNOSIS — R5383 Other fatigue: Secondary | ICD-10-CM

## 2014-09-27 DIAGNOSIS — D6481 Anemia due to antineoplastic chemotherapy: Secondary | ICD-10-CM

## 2014-09-27 DIAGNOSIS — Z170 Estrogen receptor positive status [ER+]: Secondary | ICD-10-CM

## 2014-09-27 LAB — CBC WITH DIFFERENTIAL/PLATELET
BASO%: 0.2 % (ref 0.0–2.0)
Basophils Absolute: 0 10*3/uL (ref 0.0–0.1)
EOS ABS: 0 10*3/uL (ref 0.0–0.5)
EOS%: 0.1 % (ref 0.0–7.0)
HCT: 34.5 % — ABNORMAL LOW (ref 34.8–46.6)
HEMOGLOBIN: 11.5 g/dL — AB (ref 11.6–15.9)
LYMPH%: 18 % (ref 14.0–49.7)
MCH: 29.3 pg (ref 25.1–34.0)
MCHC: 33.3 g/dL (ref 31.5–36.0)
MCV: 87.8 fL (ref 79.5–101.0)
MONO#: 1.3 10*3/uL — ABNORMAL HIGH (ref 0.1–0.9)
MONO%: 8.4 % (ref 0.0–14.0)
NEUT%: 73.3 % (ref 38.4–76.8)
NEUTROS ABS: 11.4 10*3/uL — AB (ref 1.5–6.5)
Platelets: 176 10*3/uL (ref 145–400)
RBC: 3.93 10*6/uL (ref 3.70–5.45)
RDW: 13.3 % (ref 11.2–14.5)
WBC: 15.5 10*3/uL — ABNORMAL HIGH (ref 3.9–10.3)
lymph#: 2.8 10*3/uL (ref 0.9–3.3)

## 2014-09-27 LAB — COMPREHENSIVE METABOLIC PANEL (CC13)
ALT: 32 U/L (ref 0–55)
AST: 32 U/L (ref 5–34)
Albumin: 3.3 g/dL — ABNORMAL LOW (ref 3.5–5.0)
Alkaline Phosphatase: 124 U/L (ref 40–150)
Anion Gap: 12 mEq/L — ABNORMAL HIGH (ref 3–11)
BILIRUBIN TOTAL: 0.33 mg/dL (ref 0.20–1.20)
BUN: 18.5 mg/dL (ref 7.0–26.0)
CALCIUM: 9.3 mg/dL (ref 8.4–10.4)
CHLORIDE: 107 meq/L (ref 98–109)
CO2: 26 meq/L (ref 22–29)
CREATININE: 1 mg/dL (ref 0.6–1.1)
Glucose: 108 mg/dl (ref 70–140)
Potassium: 2.9 mEq/L — CL (ref 3.5–5.1)
Sodium: 145 mEq/L (ref 136–145)
Total Protein: 6.7 g/dL (ref 6.4–8.3)

## 2014-09-27 MED ORDER — POTASSIUM CHLORIDE CRYS ER 20 MEQ PO TBCR: 20.0000 meq | EXTENDED_RELEASE_TABLET | Freq: Every day | ORAL | Status: DC

## 2014-09-27 MED ORDER — ESOMEPRAZOLE MAGNESIUM 40 MG PO CPDR: 40.0000 mg | DELAYED_RELEASE_CAPSULE | Freq: Every day | ORAL | Status: DC

## 2014-09-27 NOTE — Assessment & Plan Note (Signed)
Left breast invasive ductal carcinoma grade 3 ER 0% PR 0% HER-2 positive ratio 3.74, Ki-67 90%: 2.3 cm mass by ultrasound clinical stage: T2, N0, M0 stage II A.  Current treatment:Today is cycle 2 day 8 of neoadjuvant chemotherapy with Taxotere, carboplatin, Herceptin and Perjeta every 3 weeks.  Toxicities of chemotherapy: 1. Severe sore throat 2. Alopecia 3. Fatigue 4. Mild fever related to Neulasta 5. Chemotherapy-induced anemia  Severe sore throat: Patient completed a course of antibiotics. She does not have any further fevers. I suspect some of the sore throat could be related to acid reflux from steroids. I prescribed him Nexium 40 mg daily.  Continuing to monitor her closely for toxicities. Return to clinic in 2 weeks for cycle 3. Patient reports that rest mass is markedly shrunken size. Hence I do not believe there is any need for MRI imaging. I would get an ultrasound of the breast for further assessment after 3 cycles.

## 2014-09-27 NOTE — Addendum Note (Signed)
Addended by: Prentiss Bells on: 09/27/2014 05:33 PM   Modules accepted: Orders

## 2014-09-27 NOTE — Progress Notes (Signed)
Panic potassium rcvd on pt.  Per Dr Lindi Adie, 20 mEq potassium daily, dispense 30.  Order placed.    LMOVM - let pt know potassium low, potassium called in to [facility], pt to return call to clinic so we know she rcvd msg.

## 2014-09-27 NOTE — Progress Notes (Signed)
Patient Care Team: [name], MD as PCP - General (Internal Medicine)  DIAGNOSIS: Breast cancer of upper-outer quadrant of left female breast   Staging form: Breast, AJCC 7th Edition     Clinical: Stage IIA (T2, N0, cM0) - Unsigned       Staging comments: Staged at breast conference 08/11/14.      Pathologic: No stage assigned - Unsigned   SUMMARY OF ONCOLOGIC HISTORY:   Breast cancer of upper-outer quadrant of left female breast   08/05/2014 Mammogram Left breast 10 o clock: 2.3X 2X1.4 cm lobular hypoechoeic   08/05/2014 Initial Biopsy Grade 3 IDC Er 0%, PR 0%, Her 2 positive Ratio 3.74; Ki 67: 90%   08/17/2014 Breast MRI Left breast known malignancy upper inner quadrant 2.6 cm: Suspicious non-mass enhancement upper-inner quadrant right breast biopsy pending   08/27/2014 -  Chemotherapy Neoadjuvant chemotherapy with Taxotere, carboplatin, Herceptin, Perjeta every 3 weeks x6 cycles of Herceptin maintenance for one year.    CHIEF COMPLIANT: cycle 2 day 8 of chemotherapy  INTERVAL HISTORY: Julie [name] is a 58 year old Caucasian with above-mentioned history of left-sided breast cancer currently being treated with neoadjuvant chemotherapy with Taxotere, carboplatin, Herceptin and Perjeta. Today is cycle 2 day 8 toxicity check. Patient reports that she is feeling relatively well. She denies any nausea vomiting. She had fever for one day and was put on antibiotic. That completed yesterday. Does not have any further fevers. She developed a severe sore throat. It is making it difficult for her to talk to denies any nausea vomiting or difficulty falling asleep.  REVIEW OF SYSTEMS:   Constitutional: Denies fevers, chills or abnormal weight loss Eyes: Denies blurriness of vision Ears, nose, mouth, throat, and face: Denies mucositis or sore throat Respiratory: Denies cough, dyspnea or wheezes Cardiovascular: Denies palpitation, chest discomfort or lower extremity swelling Gastrointestinal:   Denies nausea, heartburn or change in bowel habits Skin: Denies abnormal skin rashes Lymphatics: Denies new lymphadenopathy or easy bruising Neurological:Denies numbness, tingling or new weaknesses Behavioral/Psych: Mood is stable, no new changes  Breast: breast lump in his getting smaller. All other systems were reviewed with the patient and are negative.  I have reviewed the past medical history, past surgical history, social history and family history with the patient and they are unchanged from previous note.  ALLERGIES:  is allergic to codeine; sulfa antibiotics; and penicillins.  MEDICATIONS:  Current Outpatient Prescriptions  Medication Sig Dispense Refill  . Alum & Mag Hydroxide-Simeth (MAGIC MOUTHWASH W/LIDOCAINE) SOLN Take 5 mLs by mouth 4 (four) times daily as needed for mouth pain. 240 mL 0  . Azelaic Acid (FINACEA) 15 % cream Apply topically daily. After skin is thoroughly washed and patted dry, gently but thoroughly massage a thin film of azelaic acid cream into the affected area twice daily, in the morning and evening.    Marland Kitchen buPROPion (WELLBUTRIN SR) 150 MG 12 hr tablet Take 150 mg by mouth every evening.     . cetirizine (ZYRTEC) 10 MG tablet Take 10 mg by mouth daily.    . cholestyramine (QUESTRAN) 4 G packet Take 1 packet (4 g total) by mouth 3 (three) times daily with meals. 60 each 3  . dexamethasone (DECADRON) 4 MG tablet Take 2 tablets (8 mg total) by mouth 2 (two) times daily. Start the day before Taxotere. Then again the day after chemo for 3 days. 30 tablet 1  . fluticasone (FLONASE) 50 MCG/ACT nasal spray Place into both nostrils as needed for allergies  or rhinitis.    Marland Kitchen HYDROcodone-acetaminophen (NORCO/VICODIN) 5-325 MG per tablet Take 1-2 tablets by mouth every 4 (four) hours as needed for moderate pain or severe pain. 20 tablet 0  . levofloxacin (LEVAQUIN) 500 MG tablet Take 1 tablet (500 mg total) by mouth daily. 7 tablet 0  . lidocaine-prilocaine (EMLA) cream  Apply 1 application topically as needed. 30 g 6  . loperamide (IMODIUM) 2 MG capsule Take 1 capsule (2 mg total) by mouth as needed for diarrhea or loose stools. 30 capsule 2  . LORazepam (ATIVAN) 0.5 MG tablet Take 1 tablet (0.5 mg total) by mouth every 6 (six) hours as needed (Nausea or vomiting). 30 tablet 0  . metroNIDAZOLE (METROGEL) 1 % gel Apply topically daily.    . ondansetron (ZOFRAN) 8 MG tablet Take 1 tablet (8 mg total) by mouth 2 (two) times daily. Start the day after chemo for 3 days. Then take as needed for nausea or vomiting. 30 tablet 1  . prochlorperazine (COMPAZINE) 10 MG tablet Take 1 tablet (10 mg total) by mouth every 6 (six) hours as needed (Nausea or vomiting). 30 tablet 1  . simvastatin (ZOCOR) 20 MG tablet Take 20 mg by mouth daily at 6 PM.     . esomeprazole (NEXIUM) 40 MG capsule Take 1 capsule (40 mg total) by mouth daily at 12 noon. 30 capsule 3   No current facility-administered medications for this visit.   Facility-Administered Medications Ordered in Other Visits  Medication Dose Route Frequency Provider Last Rate Last Dose  . Influenza vac split quadrivalent PF (FLUARIX) injection 0.5 mL  0.5 mL Intramuscular Tomorrow-1000 Rulon Eisenmenger, MD        PHYSICAL EXAMINATION: ECOG PERFORMANCE STATUS: 1 - Symptomatic but completely ambulatory  Filed Vitals:   09/27/14 1515  BP: 127/77  Pulse: 98  Temp: 98.5 F (36.9 C)  Resp: 18   Filed Weights   09/27/14 1515  Weight: 186 lb 9.6 oz (84.641 kg)    GENERAL:alert, no distress and comfortable SKIN: skin color, texture, turgor are normal, no rashes or significant lesions EYES: normal, Conjunctiva are pink and non-injected, sclera clear OROPHARYNX:no exudate, no erythema and lips, buccal mucosa, and tongue normal  NECK: supple, thyroid normal size, non-tender, without nodularity LYMPH:  no palpable lymphadenopathy in the cervical, axillary or inguinal LUNGS: clear to auscultation and percussion with normal  breathing effort HEART: regular rate & rhythm and no murmurs and no lower extremity edema ABDOMEN:abdomen soft, non-tender and normal bowel sounds Musculoskeletal:no cyanosis of digits and no clubbing  NEURO: alert & oriented x 3 with fluent speech, no focal motor/sensory deficits  LABORATORY DATA:  I have reviewed the data as listed   Chemistry      Component Value Date/Time   NA 142 09/17/2014 0813   K 4.0 09/17/2014 0813   CO2 22 09/17/2014 0813   BUN 18.5 09/17/2014 0813   CREATININE 0.9 09/17/2014 0813      Component Value Date/Time   CALCIUM 10.1 09/17/2014 0813   ALKPHOS 112 09/17/2014 0813   AST 17 09/17/2014 0813   ALT 32 09/17/2014 0813   BILITOT 0.31 09/17/2014 0813       Lab Results  Component Value Date   WBC 15.5* 09/27/2014   HGB 11.5* 09/27/2014   HCT 34.5* 09/27/2014   MCV 87.8 09/27/2014   PLT 176 09/27/2014   NEUTROABS 11.4* 09/27/2014    ASSESSMENT & PLAN:  Breast cancer of upper-outer quadrant of left female breast Left  breast invasive ductal carcinoma grade 3 ER 0% PR 0% HER-2 positive ratio 3.74, Ki-67 90%: 2.3 cm mass by ultrasound clinical stage: T2, N0, M0 stage II A.  Current treatment:Today is cycle 2 day 8 of neoadjuvant chemotherapy with Taxotere, carboplatin, Herceptin and Perjeta every 3 weeks.  Toxicities of chemotherapy: 1. Severe sore throat 2. Alopecia 3. Fatigue 4. Mild fever related to Neulasta 5. Chemotherapy-induced anemia  Severe sore throat: Patient completed a course of antibiotics. She does not have any further fevers. I suspect some of the sore throat could be related to acid reflux from steroids. I prescribed him Nexium 40 mg daily.  Continuing to monitor her closely for toxicities. Return to clinic in 2 weeks for cycle 3. Patient reports that rest mass is markedly shrunken size. Hence I do not believe there is any need for MRI imaging. I would get an ultrasound of the breast for further assessment after 3  cycles.   No orders of the defined types were placed in this encounter.   The patient has a good understanding of the overall plan. she agrees with it. She will call with any problems that may develop before her next visit here.   Rulon Eisenmenger, MD 09/27/2014 3:53 PM

## 2014-09-28 ENCOUNTER — Telehealth: Payer: Self-pay | Admitting: *Deleted

## 2014-09-28 ENCOUNTER — Telehealth: Payer: Self-pay

## 2014-09-28 NOTE — Telephone Encounter (Signed)
Per staff message and POF I have scheduled appts. Advised scheduler of appts. JMW  

## 2014-09-28 NOTE — Telephone Encounter (Signed)
Husband [name] called to let us know they rcvd msg about potassium and they have picked up her prescription.   Rcvd call back from Dr Lina Sar nurse.  She checked pt records - no record of allergic reaction to Ativan.  Called husband [name] and let him know.  He voiced understanding.

## 2014-10-02 ENCOUNTER — Encounter: Payer: Self-pay | Admitting: Nurse Practitioner

## 2014-10-04 ENCOUNTER — Other Ambulatory Visit: Payer: Self-pay | Admitting: Nurse Practitioner

## 2014-10-07 ENCOUNTER — Telehealth: Payer: Self-pay | Admitting: *Deleted

## 2014-10-07 ENCOUNTER — Other Ambulatory Visit: Payer: Self-pay | Admitting: *Deleted

## 2014-10-07 DIAGNOSIS — C50412 Malignant neoplasm of upper-outer quadrant of left female breast: Secondary | ICD-10-CM

## 2014-10-07 NOTE — Telephone Encounter (Signed)
Per patient I have moved appts

## 2014-10-08 ENCOUNTER — Ambulatory Visit (HOSPITAL_BASED_OUTPATIENT_CLINIC_OR_DEPARTMENT_OTHER): Payer: 59 | Admitting: Hematology and Oncology

## 2014-10-08 ENCOUNTER — Telehealth: Payer: Self-pay | Admitting: Hematology and Oncology

## 2014-10-08 ENCOUNTER — Other Ambulatory Visit (HOSPITAL_BASED_OUTPATIENT_CLINIC_OR_DEPARTMENT_OTHER): Payer: 59

## 2014-10-08 ENCOUNTER — Ambulatory Visit (HOSPITAL_BASED_OUTPATIENT_CLINIC_OR_DEPARTMENT_OTHER): Payer: 59

## 2014-10-08 VITALS — BP 127/78 | HR 84 | Temp 98.3°F | Resp 18 | Ht 62.0 in | Wt 188.9 lb

## 2014-10-08 DIAGNOSIS — C50212 Malignant neoplasm of upper-inner quadrant of left female breast: Secondary | ICD-10-CM

## 2014-10-08 DIAGNOSIS — R5383 Other fatigue: Secondary | ICD-10-CM

## 2014-10-08 DIAGNOSIS — D6481 Anemia due to antineoplastic chemotherapy: Secondary | ICD-10-CM

## 2014-10-08 DIAGNOSIS — C50412 Malignant neoplasm of upper-outer quadrant of left female breast: Secondary | ICD-10-CM

## 2014-10-08 DIAGNOSIS — R070 Pain in throat: Secondary | ICD-10-CM

## 2014-10-08 DIAGNOSIS — Z5112 Encounter for antineoplastic immunotherapy: Secondary | ICD-10-CM

## 2014-10-08 DIAGNOSIS — R509 Fever, unspecified: Secondary | ICD-10-CM

## 2014-10-08 DIAGNOSIS — L659 Nonscarring hair loss, unspecified: Secondary | ICD-10-CM

## 2014-10-08 DIAGNOSIS — Z5111 Encounter for antineoplastic chemotherapy: Secondary | ICD-10-CM

## 2014-10-08 DIAGNOSIS — E876 Hypokalemia: Secondary | ICD-10-CM

## 2014-10-08 LAB — COMPREHENSIVE METABOLIC PANEL (CC13)
ALBUMIN: 3.4 g/dL — AB (ref 3.5–5.0)
ALT: 38 U/L (ref 0–55)
AST: 24 U/L (ref 5–34)
Alkaline Phosphatase: 95 U/L (ref 40–150)
Anion Gap: 9 mEq/L (ref 3–11)
BUN: 20.8 mg/dL (ref 7.0–26.0)
CALCIUM: 9.8 mg/dL (ref 8.4–10.4)
CHLORIDE: 108 meq/L (ref 98–109)
CO2: 23 meq/L (ref 22–29)
Creatinine: 0.8 mg/dL (ref 0.6–1.1)
EGFR: 82 mL/min/{1.73_m2} — ABNORMAL LOW (ref >90)
Glucose: 126 mg/dl (ref 70–140)
POTASSIUM: 3.8 meq/L (ref 3.5–5.1)
Sodium: 141 mEq/L (ref 136–145)
Total Bilirubin: 0.25 mg/dL (ref 0.20–1.20)
Total Protein: 6.5 g/dL (ref 6.4–8.3)

## 2014-10-08 LAB — CBC WITH DIFFERENTIAL/PLATELET
BASO%: 0.2 % (ref 0.0–2.0)
Basophils Absolute: 0 10*3/uL (ref 0.0–0.1)
EOS%: 0 % (ref 0.0–7.0)
Eosinophils Absolute: 0 10*3/uL (ref 0.0–0.5)
HCT: 30.4 % — ABNORMAL LOW (ref 34.8–46.6)
HGB: 10.3 g/dL — ABNORMAL LOW (ref 11.6–15.9)
LYMPH%: 17.6 % (ref 14.0–49.7)
MCH: 30.6 pg (ref 25.1–34.0)
MCHC: 33.7 g/dL (ref 31.5–36.0)
MCV: 90.8 fL (ref 79.5–101.0)
MONO#: 0.7 10*3/uL (ref 0.1–0.9)
MONO%: 7 % (ref 0.0–14.0)
NEUT%: 75.2 % (ref 38.4–76.8)
NEUTROS ABS: 7 10*3/uL — AB (ref 1.5–6.5)
Platelets: 248 10*3/uL (ref 145–400)
RBC: 3.35 10*6/uL — ABNORMAL LOW (ref 3.70–5.45)
RDW: 14.6 % — AB (ref 11.2–14.5)
WBC: 9.2 10*3/uL (ref 3.9–10.3)
lymph#: 1.6 10*3/uL (ref 0.9–3.3)

## 2014-10-08 MED ORDER — TRASTUZUMAB CHEMO INJECTION 440 MG
6.0000 mg/kg | Freq: Once | INTRAVENOUS | Status: AC
  Administered 2014-10-08: 546 mg via INTRAVENOUS
  Filled 2014-10-08: qty 26

## 2014-10-08 MED ORDER — HEPARIN SOD (PORK) LOCK FLUSH 100 UNIT/ML IV SOLN
500.0000 [IU] | Freq: Once | INTRAVENOUS | Status: AC | PRN
Start: 2014-10-08 — End: 2014-10-08
  Administered 2014-10-08: 500 [IU]
  Filled 2014-10-08: qty 5

## 2014-10-08 MED ORDER — DIPHENHYDRAMINE HCL 25 MG PO CAPS
50.0000 mg | ORAL_CAPSULE | Freq: Once | ORAL | Status: AC
  Administered 2014-10-08: 50 mg via ORAL

## 2014-10-08 MED ORDER — ONDANSETRON 16 MG/50ML IVPB (CHCC)
INTRAVENOUS | Status: AC
  Filled 2014-10-08: qty 16

## 2014-10-08 MED ORDER — SODIUM CHLORIDE 0.9 % IV SOLN: Freq: Once | INTRAVENOUS | Status: DC

## 2014-10-08 MED ORDER — SODIUM CHLORIDE 0.9 % IV SOLN
Freq: Once | INTRAVENOUS | Status: AC
  Administered 2014-10-08: 10:00:00 via INTRAVENOUS

## 2014-10-08 MED ORDER — PERTUZUMAB CHEMO INJECTION 420 MG/14ML
420.0000 mg | Freq: Once | INTRAVENOUS | Status: AC
  Administered 2014-10-08: 420 mg via INTRAVENOUS
  Filled 2014-10-08: qty 14

## 2014-10-08 MED ORDER — ONDANSETRON 16 MG/50ML IVPB (CHCC)
16.0000 mg | Freq: Once | INTRAVENOUS | Status: AC
  Administered 2014-10-08: 16 mg via INTRAVENOUS

## 2014-10-08 MED ORDER — DIPHENHYDRAMINE HCL 25 MG PO CAPS
ORAL_CAPSULE | ORAL | Status: AC
  Filled 2014-10-08: qty 2

## 2014-10-08 MED ORDER — ACETAMINOPHEN 325 MG PO TABS
650.0000 mg | ORAL_TABLET | Freq: Once | ORAL | Status: AC
  Administered 2014-10-08: 650 mg via ORAL

## 2014-10-08 MED ORDER — DEXAMETHASONE SODIUM PHOSPHATE 20 MG/5ML IJ SOLN
INTRAMUSCULAR | Status: AC
  Filled 2014-10-08: qty 5

## 2014-10-08 MED ORDER — DOCETAXEL CHEMO INJECTION 160 MG/16ML
75.0000 mg/m2 | Freq: Once | INTRAVENOUS | Status: AC
  Administered 2014-10-08: 150 mg via INTRAVENOUS
  Filled 2014-10-08: qty 15

## 2014-10-08 MED ORDER — ACETAMINOPHEN 325 MG PO TABS
ORAL_TABLET | ORAL | Status: AC
  Filled 2014-10-08: qty 2

## 2014-10-08 MED ORDER — CARBOPLATIN CHEMO INJECTION 600 MG/60ML
700.0000 mg | Freq: Once | INTRAVENOUS | Status: AC
  Administered 2014-10-08: 700 mg via INTRAVENOUS
  Filled 2014-10-08: qty 70

## 2014-10-08 MED ORDER — SODIUM CHLORIDE 0.9 % IJ SOLN
10.0000 mL | INTRAMUSCULAR | Status: DC | PRN
  Administered 2014-10-08 (×2): 10 mL
  Filled 2014-10-08: qty 10

## 2014-10-08 MED ORDER — PALONOSETRON HCL INJECTION 0.25 MG/5ML
INTRAVENOUS | Status: AC
  Filled 2014-10-08: qty 5

## 2014-10-08 MED ORDER — SODIUM CHLORIDE 0.9 % IV SOLN
150.0000 mg | Freq: Once | INTRAVENOUS | Status: AC
  Administered 2014-10-08: 150 mg via INTRAVENOUS
  Filled 2014-10-08: qty 5

## 2014-10-08 MED ORDER — DEXAMETHASONE SODIUM PHOSPHATE 20 MG/5ML IJ SOLN
12.0000 mg | Freq: Once | INTRAMUSCULAR | Status: AC
  Administered 2014-10-08: 12 mg via INTRAVENOUS

## 2014-10-08 NOTE — Assessment & Plan Note (Addendum)
Left breast invasive ductal carcinoma grade 3 ER 0% PR 0% HER-2 positive ratio 3.74, Ki-67 90%: 2.3 cm mass by ultrasound clinical stage: T2, N0, M0 stage II A.  Current treatment:Today is cycle 3 of neoadjuvant chemotherapy with Taxotere, carboplatin, Herceptin and Perjeta every 3 weeks.  Toxicities of chemotherapy: 1. Severe sore throat: previously treated with antibiotics, Nexium was added on 09/27/2014 2. Alopecia 3. Fatigue 4. Mild fever related to Neulasta 5. Chemotherapy-induced anemia: Stable 6. hypokalemia I reviewed her blood counts that adequate for treatment today. Return to clinic in 3 weeks for cycle #4

## 2014-10-08 NOTE — Progress Notes (Signed)
Patient Care Team: [name], MD as PCP - General (Internal Medicine)  DIAGNOSIS: Breast cancer of upper-outer quadrant of left female breast   Staging form: Breast, AJCC 7th Edition     Clinical: Stage IIA (T2, N0, cM0) - Unsigned       Staging comments: Staged at breast conference 08/11/14.      Pathologic: No stage assigned - Unsigned   SUMMARY OF ONCOLOGIC HISTORY:   Breast cancer of upper-outer quadrant of left female breast   08/05/2014 Mammogram Left breast 10 o clock: 2.3X 2X1.4 cm lobular hypoechoeic   08/05/2014 Initial Biopsy Grade 3 IDC Er 0%, PR 0%, Her 2 positive Ratio 3.74; Ki 67: 90%   08/17/2014 Breast MRI Left breast known malignancy upper inner quadrant 2.6 cm: Suspicious non-mass enhancement upper-inner quadrant right breast biopsy pending   08/27/2014 -  Chemotherapy Neoadjuvant chemotherapy with Taxotere, carboplatin, Herceptin, Perjeta every 3 weeks x6 cycles of Herceptin maintenance for one year.    CHIEF COMPLIANT: Follow-up and cycle 3 of the [facility] Perjeta chemotherapy  INTERVAL HISTORY: Julie [name] is a 58 year old lady with above-mentioned history of left-sided breast cancer who is here for cycle 3 of chemotherapy with [place]. She appears to be tolerating chemotherapy fairly well. She denies any nausea or vomiting. She has intermittent diarrhea with constipation. Denies any neuropathy symptoms.  REVIEW OF SYSTEMS:   Constitutional: Denies fevers, chills or abnormal weight loss; alopecia Eyes: Denies blurriness of vision Ears, nose, mouth, throat, and face: Denies mucositis or sore throat Respiratory: Denies cough, dyspnea or wheezes Cardiovascular: Denies palpitation, chest discomfort or lower extremity swelling Gastrointestinal:  Denies nausea, heartburn or change in bowel habits Skin: Denies abnormal skin rashes Lymphatics: Denies new lymphadenopathy or easy bruising Neurological:Denies numbness, tingling or new weaknesses Behavioral/Psych:  Mood is stable, no new changes  All other systems were reviewed with the patient and are negative.  I have reviewed the past medical history, past surgical history, social history and family history with the patient and they are unchanged from previous note.  ALLERGIES:  is allergic to codeine; sulfa antibiotics; and penicillins.  MEDICATIONS:  Current Outpatient Prescriptions  Medication Sig Dispense Refill  . Alum & Mag Hydroxide-Simeth (MAGIC MOUTHWASH W/LIDOCAINE) SOLN Take 5 mLs by mouth 4 (four) times daily as needed for mouth pain. 240 mL 0  . Azelaic Acid (FINACEA) 15 % cream Apply topically daily. After skin is thoroughly washed and patted dry, gently but thoroughly massage a thin film of azelaic acid cream into the affected area twice daily, in the morning and evening.    Marland Kitchen buPROPion (WELLBUTRIN SR) 150 MG 12 hr tablet Take 150 mg by mouth every evening.     . cetirizine (ZYRTEC) 10 MG tablet Take 10 mg by mouth daily.    . cholestyramine (QUESTRAN) 4 G packet Take 1 packet (4 g total) by mouth 3 (three) times daily with meals. 60 each 3  . dexamethasone (DECADRON) 4 MG tablet Take 2 tablets (8 mg total) by mouth 2 (two) times daily. Start the day before Taxotere. Then again the day after chemo for 3 days. 30 tablet 1  . esomeprazole (NEXIUM) 40 MG capsule Take 1 capsule (40 mg total) by mouth daily at 12 noon. 30 capsule 3  . fluticasone (FLONASE) 50 MCG/ACT nasal spray Place into both nostrils as needed for allergies or rhinitis.    Marland Kitchen HYDROcodone-acetaminophen (NORCO/VICODIN) 5-325 MG per tablet Take 1-2 tablets by mouth every 4 (four) hours as needed  for moderate pain or severe pain. 20 tablet 0  . levofloxacin (LEVAQUIN) 500 MG tablet Take 1 tablet (500 mg total) by mouth daily. 7 tablet 0  . lidocaine-prilocaine (EMLA) cream Apply 1 application topically as needed. 30 g 6  . loperamide (IMODIUM) 2 MG capsule Take 1 capsule (2 mg total) by mouth as needed for diarrhea or loose  stools. 30 capsule 2  . LORazepam (ATIVAN) 0.5 MG tablet Take 1 tablet (0.5 mg total) by mouth every 6 (six) hours as needed (Nausea or vomiting). 30 tablet 0  . metroNIDAZOLE (METROGEL) 1 % gel Apply topically daily.    . ondansetron (ZOFRAN) 8 MG tablet Take 1 tablet (8 mg total) by mouth 2 (two) times daily. Start the day after chemo for 3 days. Then take as needed for nausea or vomiting. 30 tablet 1  . potassium chloride SA (K-DUR,KLOR-CON) 20 MEQ tablet Take 1 tablet (20 mEq total) by mouth daily. 30 tablet 0  . prochlorperazine (COMPAZINE) 10 MG tablet Take 1 tablet (10 mg total) by mouth every 6 (six) hours as needed (Nausea or vomiting). 30 tablet 1  . simvastatin (ZOCOR) 20 MG tablet Take 20 mg by mouth daily at 6 PM.      No current facility-administered medications for this visit.   Facility-Administered Medications Ordered in Other Visits  Medication Dose Route Frequency Provider Last Rate Last Dose  . Influenza vac split quadrivalent PF (FLUARIX) injection 0.5 mL  0.5 mL Intramuscular Tomorrow-1000 Rulon Eisenmenger, MD        PHYSICAL EXAMINATION: ECOG PERFORMANCE STATUS: 1 - Symptomatic but completely ambulatory  Filed Vitals:   10/08/14 0844  BP: 127/78  Pulse: 84  Temp: 98.3 F (36.8 C)  Resp: 18   Filed Weights   10/08/14 0844  Weight: 188 lb 14.4 oz (85.684 kg)    GENERAL:alert, no distress and comfortable SKIN: skin color, texture, turgor are normal, no rashes or significant lesions EYES: normal, Conjunctiva are pink and non-injected, sclera clear OROPHARYNX:no exudate, no erythema and lips, buccal mucosa, and tongue normal  NECK: supple, thyroid normal size, non-tender, without nodularity LYMPH:  no palpable lymphadenopathy in the cervical, axillary or inguinal LUNGS: clear to auscultation and percussion with normal breathing effort HEART: regular rate & rhythm and no murmurs and no lower extremity edema ABDOMEN:abdomen soft, non-tender and normal bowel  sounds Musculoskeletal:no cyanosis of digits and no clubbing  NEURO: alert & oriented x 3 with fluent speech, no focal motor/sensory deficits  LABORATORY DATA:  I have reviewed the data as listed   Chemistry      Component Value Date/Time   NA 141 10/08/2014 0833   K 3.8 10/08/2014 0833   CO2 23 10/08/2014 0833   BUN 20.8 10/08/2014 0833   CREATININE 0.8 10/08/2014 0833      Component Value Date/Time   CALCIUM 9.8 10/08/2014 0833   ALKPHOS 95 10/08/2014 0833   AST 24 10/08/2014 0833   ALT 38 10/08/2014 0833   BILITOT 0.25 10/08/2014 0833       Lab Results  Component Value Date   WBC 9.2 10/08/2014   HGB 10.3* 10/08/2014   HCT 30.4* 10/08/2014   MCV 90.8 10/08/2014   PLT 248 10/08/2014   NEUTROABS 7.0* 10/08/2014   ASSESSMENT & PLAN:  Breast cancer of upper-outer quadrant of left female breast Left breast invasive ductal carcinoma grade 3 ER 0% PR 0% HER-2 positive ratio 3.74, Ki-67 90%: 2.3 cm mass by ultrasound clinical stage: T2, N0,  M0 stage II A.  Current treatment:Today is cycle 3 of neoadjuvant chemotherapy with Taxotere, carboplatin, Herceptin and Perjeta every 3 weeks.  Toxicities of chemotherapy: 1. Severe sore throat: previously treated with antibiotics, Nexium was added on 09/27/2014 2. Alopecia 3. Fatigue 4. Mild fever related to Neulasta 5. Chemotherapy-induced anemia: Stable 6. hypokalemia I reviewed her blood counts that adequate for treatment today. Return to clinic in 3 weeks for cycle #4    Orders Placed This Encounter  Procedures  . CBC with Differential    Standing Status: Future     Number of Occurrences:      Standing Expiration Date: 10/08/2015  . Comprehensive metabolic panel (Cmet) - [facility]    Standing Status: Future     Number of Occurrences:      Standing Expiration Date: 10/08/2015   The patient has a good understanding of the overall plan. she agrees with it. She will call with any problems that may develop before her next visit  here.   Rulon Eisenmenger, MD 10/08/2014 9:55 AM

## 2014-10-08 NOTE — Patient Instructions (Signed)
[facility] Discharge Instructions for Patients Receiving Chemotherapy  Today you received the following chemotherapy agents: taxotere, carboplatin, herceptin, perjeta  To help prevent nausea and vomiting after your treatment, we encourage you to take your nausea medication.  Take it as often as prescribed.     If you develop nausea and vomiting that is not controlled by your nausea medication, call the clinic. If it is after clinic hours your family physician or the after hours number for the clinic or go to the Emergency Department.   BELOW ARE SYMPTOMS THAT SHOULD BE REPORTED IMMEDIATELY:  *FEVER GREATER THAN 100.5 F  *CHILLS WITH OR WITHOUT FEVER  NAUSEA AND VOMITING THAT IS NOT CONTROLLED WITH YOUR NAUSEA MEDICATION  *UNUSUAL SHORTNESS OF BREATH  *UNUSUAL BRUISING OR BLEEDING  TENDERNESS IN MOUTH AND THROAT WITH OR WITHOUT PRESENCE OF ULCERS  *URINARY PROBLEMS  *BOWEL PROBLEMS  UNUSUAL RASH Items with * indicate a potential emergency and should be followed up as soon as possible.  Feel free to call the clinic you have any questions or concerns. The clinic phone number is [phone].   I have been informed and understand all the instructions given to me. I know to contact the clinic, my physician, or go to the Emergency Department if any problems should occur. I do not have any questions at this time, but understand that I may call the clinic during office hours   should I have any questions or need assistance in obtaining follow up care.    __________________________________________  _____________  __________ Signature of Patient or Authorized Representative            Date                   Time    __________________________________________ Nurse's Signature    

## 2014-10-08 NOTE — Progress Notes (Signed)
Records rcvd from [facility] dtd 09/09/14 Dr Raphael Gibney.  Copy to Dr Lindi Adie.  Original to scan.

## 2014-10-09 ENCOUNTER — Ambulatory Visit (HOSPITAL_BASED_OUTPATIENT_CLINIC_OR_DEPARTMENT_OTHER): Payer: 59

## 2014-10-09 ENCOUNTER — Other Ambulatory Visit: Payer: Self-pay | Admitting: *Deleted

## 2014-10-09 DIAGNOSIS — Z5189 Encounter for other specified aftercare: Secondary | ICD-10-CM

## 2014-10-09 DIAGNOSIS — C50212 Malignant neoplasm of upper-inner quadrant of left female breast: Secondary | ICD-10-CM

## 2014-10-09 DIAGNOSIS — C50412 Malignant neoplasm of upper-outer quadrant of left female breast: Secondary | ICD-10-CM

## 2014-10-09 MED ORDER — PEGFILGRASTIM INJECTION 6 MG/0.6ML ~~LOC~~
6.0000 mg | PREFILLED_SYRINGE | Freq: Once | SUBCUTANEOUS | Status: AC
  Administered 2014-10-09: 6 mg via SUBCUTANEOUS
  Filled 2014-10-09: qty 0.6

## 2014-10-18 ENCOUNTER — Telehealth: Payer: Self-pay | Admitting: *Deleted

## 2014-10-18 NOTE — Telephone Encounter (Signed)
Patient called and moved appts from 1/4 to 1/8a due to insurance

## 2014-10-28 ENCOUNTER — Other Ambulatory Visit: Payer: 59

## 2014-10-28 ENCOUNTER — Ambulatory Visit: Payer: 59

## 2014-10-29 HISTORY — PX: BREAST LUMPECTOMY: SHX2

## 2014-11-01 ENCOUNTER — Other Ambulatory Visit: Payer: 59

## 2014-11-01 ENCOUNTER — Ambulatory Visit: Payer: 59

## 2014-11-01 ENCOUNTER — Ambulatory Visit: Payer: 59 | Admitting: Hematology and Oncology

## 2014-11-05 ENCOUNTER — Ambulatory Visit (HOSPITAL_BASED_OUTPATIENT_CLINIC_OR_DEPARTMENT_OTHER): Payer: BLUE CROSS/BLUE SHIELD

## 2014-11-05 ENCOUNTER — Ambulatory Visit (HOSPITAL_BASED_OUTPATIENT_CLINIC_OR_DEPARTMENT_OTHER): Payer: BLUE CROSS/BLUE SHIELD | Admitting: Hematology and Oncology

## 2014-11-05 ENCOUNTER — Other Ambulatory Visit (HOSPITAL_BASED_OUTPATIENT_CLINIC_OR_DEPARTMENT_OTHER): Payer: BLUE CROSS/BLUE SHIELD

## 2014-11-05 ENCOUNTER — Telehealth: Payer: Self-pay | Admitting: Nurse Practitioner

## 2014-11-05 ENCOUNTER — Telehealth: Payer: Self-pay | Admitting: Hematology and Oncology

## 2014-11-05 VITALS — BP 128/82 | HR 87 | Temp 98.7°F | Resp 18 | Ht 62.0 in | Wt 180.2 lb

## 2014-11-05 DIAGNOSIS — C50412 Malignant neoplasm of upper-outer quadrant of left female breast: Secondary | ICD-10-CM

## 2014-11-05 DIAGNOSIS — Z5111 Encounter for antineoplastic chemotherapy: Secondary | ICD-10-CM

## 2014-11-05 DIAGNOSIS — Z5112 Encounter for antineoplastic immunotherapy: Secondary | ICD-10-CM

## 2014-11-05 DIAGNOSIS — E876 Hypokalemia: Secondary | ICD-10-CM

## 2014-11-05 DIAGNOSIS — R197 Diarrhea, unspecified: Secondary | ICD-10-CM

## 2014-11-05 LAB — CBC WITH DIFFERENTIAL/PLATELET
BASO%: 0 % (ref 0.0–2.0)
Basophils Absolute: 0 10*3/uL (ref 0.0–0.1)
EOS%: 0 % (ref 0.0–7.0)
Eosinophils Absolute: 0 10*3/uL (ref 0.0–0.5)
HCT: 33.4 % — ABNORMAL LOW (ref 34.8–46.6)
HGB: 11 g/dL — ABNORMAL LOW (ref 11.6–15.9)
LYMPH#: 0.9 10*3/uL (ref 0.9–3.3)
LYMPH%: 12.9 % — ABNORMAL LOW (ref 14.0–49.7)
MCH: 31.6 pg (ref 25.1–34.0)
MCHC: 32.9 g/dL (ref 31.5–36.0)
MCV: 96 fL (ref 79.5–101.0)
MONO#: 0.4 10*3/uL (ref 0.1–0.9)
MONO%: 5.3 % (ref 0.0–14.0)
NEUT#: 5.9 10*3/uL (ref 1.5–6.5)
NEUT%: 81.8 % — ABNORMAL HIGH (ref 38.4–76.8)
PLATELETS: 395 10*3/uL (ref 145–400)
RBC: 3.48 10*6/uL — ABNORMAL LOW (ref 3.70–5.45)
RDW: 20 % — ABNORMAL HIGH (ref 11.2–14.5)
WBC: 7.2 10*3/uL (ref 3.9–10.3)

## 2014-11-05 LAB — COMPREHENSIVE METABOLIC PANEL (CC13)
ALK PHOS: 112 U/L (ref 40–150)
ALT: 17 U/L (ref 0–55)
AST: 21 U/L (ref 5–34)
Albumin: 3.8 g/dL (ref 3.5–5.0)
Anion Gap: 11 mEq/L (ref 3–11)
BILIRUBIN TOTAL: 0.39 mg/dL (ref 0.20–1.20)
BUN: 14.1 mg/dL (ref 7.0–26.0)
CHLORIDE: 107 meq/L (ref 98–109)
CO2: 24 meq/L (ref 22–29)
CREATININE: 0.9 mg/dL (ref 0.6–1.1)
Calcium: 10 mg/dL (ref 8.4–10.4)
EGFR: 70 mL/min/{1.73_m2} — ABNORMAL LOW (ref >90)
GLUCOSE: 151 mg/dL — AB (ref 70–140)
Potassium: 4.1 mEq/L (ref 3.5–5.1)
Sodium: 142 mEq/L (ref 136–145)
Total Protein: 6.8 g/dL (ref 6.4–8.3)

## 2014-11-05 MED ORDER — FOSAPREPITANT DIMEGLUMINE INJECTION 150 MG
150.0000 mg | Freq: Once | INTRAVENOUS | Status: AC
  Administered 2014-11-05: 150 mg via INTRAVENOUS
  Filled 2014-11-05: qty 5

## 2014-11-05 MED ORDER — DIPHENHYDRAMINE HCL 25 MG PO CAPS
ORAL_CAPSULE | ORAL | Status: AC
  Filled 2014-11-05: qty 2

## 2014-11-05 MED ORDER — SODIUM CHLORIDE 0.9 % IV SOLN
420.0000 mg | Freq: Once | INTRAVENOUS | Status: AC
  Administered 2014-11-05: 420 mg via INTRAVENOUS
  Filled 2014-11-05: qty 14

## 2014-11-05 MED ORDER — HEPARIN SOD (PORK) LOCK FLUSH 100 UNIT/ML IV SOLN
500.0000 [IU] | Freq: Once | INTRAVENOUS | Status: AC | PRN
  Administered 2014-11-05: 500 [IU]
  Filled 2014-11-05: qty 5

## 2014-11-05 MED ORDER — DIPHENHYDRAMINE HCL 25 MG PO CAPS
50.0000 mg | ORAL_CAPSULE | Freq: Once | ORAL | Status: AC
Start: 2014-11-05 — End: 2014-11-05
  Administered 2014-11-05: 50 mg via ORAL

## 2014-11-05 MED ORDER — ACETAMINOPHEN 325 MG PO TABS
ORAL_TABLET | ORAL | Status: AC
  Filled 2014-11-05: qty 2

## 2014-11-05 MED ORDER — ONDANSETRON 16 MG/50ML IVPB (CHCC)
16.0000 mg | Freq: Once | INTRAVENOUS | Status: AC
  Administered 2014-11-05: 16 mg via INTRAVENOUS

## 2014-11-05 MED ORDER — ONDANSETRON 16 MG/50ML IVPB (CHCC)
INTRAVENOUS | Status: AC
  Filled 2014-11-05: qty 16

## 2014-11-05 MED ORDER — SODIUM CHLORIDE 0.9 % IV SOLN
700.0000 mg | Freq: Once | INTRAVENOUS | Status: AC
  Administered 2014-11-05: 700 mg via INTRAVENOUS
  Filled 2014-11-05: qty 70

## 2014-11-05 MED ORDER — DOCETAXEL CHEMO INJECTION 160 MG/16ML
75.0000 mg/m2 | Freq: Once | INTRAVENOUS | Status: AC
  Administered 2014-11-05: 150 mg via INTRAVENOUS
  Filled 2014-11-05: qty 15

## 2014-11-05 MED ORDER — DEXAMETHASONE SODIUM PHOSPHATE 20 MG/5ML IJ SOLN
12.0000 mg | Freq: Once | INTRAMUSCULAR | Status: AC
  Administered 2014-11-05: 12 mg via INTRAVENOUS

## 2014-11-05 MED ORDER — SODIUM CHLORIDE 0.9 % IV SOLN
Freq: Once | INTRAVENOUS | Status: AC
  Administered 2014-11-05: 10:00:00 via INTRAVENOUS

## 2014-11-05 MED ORDER — SODIUM CHLORIDE 0.9 % IJ SOLN
10.0000 mL | INTRAMUSCULAR | Status: DC | PRN
  Administered 2014-11-05: 10 mL
  Filled 2014-11-05: qty 10

## 2014-11-05 MED ORDER — DEXAMETHASONE SODIUM PHOSPHATE 20 MG/5ML IJ SOLN
INTRAMUSCULAR | Status: AC
Start: 2014-11-05 — End: 2014-11-05
  Filled 2014-11-05: qty 5

## 2014-11-05 MED ORDER — ACETAMINOPHEN 325 MG PO TABS
650.0000 mg | ORAL_TABLET | Freq: Once | ORAL | Status: AC
  Administered 2014-11-05: 650 mg via ORAL

## 2014-11-05 MED ORDER — TRASTUZUMAB CHEMO INJECTION 440 MG
490.0000 mg | Freq: Once | INTRAVENOUS | Status: AC
  Administered 2014-11-05: 490 mg via INTRAVENOUS
  Filled 2014-11-05: qty 23.33

## 2014-11-05 NOTE — Telephone Encounter (Signed)
, °

## 2014-11-05 NOTE — Telephone Encounter (Signed)
Left message on home answering machine to come in at 1:45 for injection on Saturday 11/06/14 due to insurance and billing requirements. [phone] given on voicemail for any questions.  Called mobile number, able to speak with patient to explain schedule changes. Patient verbalizes understanding of new appt.

## 2014-11-05 NOTE — Patient Instructions (Signed)
[facility] Discharge Instructions for Patients Receiving Chemotherapy  Today you received the following chemotherapy agents: Herceptin, Perjeta, Taxotere, Carboplatin  To help prevent nausea and vomiting after your treatment, we encourage you to take your nausea medication as prescribed.    If you develop nausea and vomiting that is not controlled by your nausea medication, call the clinic.   BELOW ARE SYMPTOMS THAT SHOULD BE REPORTED IMMEDIATELY:  *FEVER GREATER THAN 100.5 F  *CHILLS WITH OR WITHOUT FEVER  NAUSEA AND VOMITING THAT IS NOT CONTROLLED WITH YOUR NAUSEA MEDICATION  *UNUSUAL SHORTNESS OF BREATH  *UNUSUAL BRUISING OR BLEEDING  TENDERNESS IN MOUTH AND THROAT WITH OR WITHOUT PRESENCE OF ULCERS  *URINARY PROBLEMS  *BOWEL PROBLEMS  UNUSUAL RASH Items with * indicate a potential emergency and should be followed up as soon as possible.  Feel free to call the clinic you have any questions or concerns. The clinic phone number is [phone].

## 2014-11-05 NOTE — Assessment & Plan Note (Signed)
Left breast invasive ductal carcinoma grade 3 ER 0% PR 0% HER-2 positive ratio 3.74, Ki-67 90%: 2.3 cm mass by ultrasound clinical stage: T2, N0, M0 stage II A.  Current treatment:Today is cycle 4 of neoadjuvant chemotherapy with Taxotere, carboplatin, Herceptin and Perjeta every 3 weeks.  Toxicities of chemotherapy: 1. Severe sore throat: previously treated with antibiotics, Nexium was added on 09/27/2014 2. Alopecia 3. Fatigue 4. Mild fever related to Neulasta 5. Chemotherapy-induced anemia: Stable 6. hypokalemia I reviewed her blood counts that adequate for treatment today. Return to clinic in 3 weeks for cycle #5 

## 2014-11-05 NOTE — Progress Notes (Signed)
Patient Care Team: [name], MD as PCP - General (Internal Medicine)  DIAGNOSIS: Breast cancer of upper-outer quadrant of left female breast   Staging form: Breast, AJCC 7th Edition     Clinical: Stage IIA (T2, N0, cM0) - Unsigned       Staging comments: Staged at breast conference 08/11/14.      Pathologic: No stage assigned - Unsigned   SUMMARY OF ONCOLOGIC HISTORY:   Breast cancer of upper-outer quadrant of left female breast   08/05/2014 Mammogram Left breast 10 o clock: 2.3X 2X1.4 cm lobular hypoechoeic   08/05/2014 Initial Biopsy Grade 3 IDC Er 0%, PR 0%, Her 2 positive Ratio 3.74; Ki 67: 90%   08/17/2014 Breast MRI Left breast known malignancy upper inner quadrant 2.6 cm: Suspicious non-mass enhancement upper-inner quadrant right breast biopsy pending   08/27/2014 -  Chemotherapy Neoadjuvant chemotherapy with Taxotere, carboplatin, Herceptin, Perjeta every 3 weeks x6 cycles of Herceptin maintenance for one year.    CHIEF COMPLIANT: Cycle 4 TC H Perjeta  INTERVAL HISTORY: Julie [name] is a 59 year old lady with above-mentioned history of left-sided breast cancer neoadjuvant chemotherapy. Today's cycle #4 of Julie [name]. Overall she appeared to be tolerating it fairly well except for diarrhea which keeps coming up back and forth. She was also hypokalemic related to diarrhea and he is now on oral potassium supplementation every other day. She does get abdominal cramps intermittently when she gets the diarrhea. Denies any nausea or vomiting. She had sinus infection and fevers previously but they have all resolved currently.  REVIEW OF SYSTEMS:   Constitutional: Denies fevers, chills or abnormal weight loss Eyes: Denies blurriness of vision Ears, nose, mouth, throat, and face: Denies mucositis or sore throat Respiratory: Denies cough, dyspnea or wheezes Cardiovascular: Denies palpitation, chest discomfort or lower extremity swelling Gastrointestinal:  Denies nausea, heartburn  or change in bowel habits Skin: Denies abnormal skin rashes Lymphatics: Denies new lymphadenopathy or easy bruising Neurological:Denies numbness, tingling or new weaknesses Behavioral/Psych: Mood is stable, no new changes  Breast: Lump in the breast is markedly improved All other systems were reviewed with the patient and are negative.  I have reviewed the past medical history, past surgical history, social history and family history with the patient and they are unchanged from previous note.  ALLERGIES:  is allergic to codeine; sulfa antibiotics; and penicillins.  MEDICATIONS:  Current Outpatient Prescriptions  Medication Sig Dispense Refill  . Alum & Mag Hydroxide-Simeth (MAGIC MOUTHWASH W/LIDOCAINE) SOLN Take 5 mLs by mouth 4 (four) times daily as needed for mouth pain. 240 mL 0  . Azelaic Acid (FINACEA) 15 % cream Apply topically daily. After skin is thoroughly washed and patted dry, gently but thoroughly massage a thin film of azelaic acid cream into the affected area twice daily, in the morning and evening.    Marland Kitchen buPROPion (WELLBUTRIN SR) 150 MG 12 hr tablet Take 150 mg by mouth every evening.     . cetirizine (ZYRTEC) 10 MG tablet Take 10 mg by mouth daily.    . cholestyramine (QUESTRAN) 4 G packet Take 1 packet (4 g total) by mouth 3 (three) times daily with meals. 60 each 3  . dexamethasone (DECADRON) 4 MG tablet Take 2 tablets (8 mg total) by mouth 2 (two) times daily. Start the day before Taxotere. Then again the day after chemo for 3 days. 30 tablet 1  . esomeprazole (NEXIUM) 40 MG capsule Take 1 capsule (40 mg total) by mouth daily at 12 noon.  30 capsule 3  . fluticasone (FLONASE) 50 MCG/ACT nasal spray Place into both nostrils as needed for allergies or rhinitis.    Marland Kitchen HYDROcodone-acetaminophen (NORCO/VICODIN) 5-325 MG per tablet Take 1-2 tablets by mouth every 4 (four) hours as needed for moderate pain or severe pain. 20 tablet 0  . levofloxacin (LEVAQUIN) 500 MG tablet Take 1  tablet (500 mg total) by mouth daily. 7 tablet 0  . lidocaine-prilocaine (EMLA) cream Apply 1 application topically as needed. 30 g 6  . loperamide (IMODIUM) 2 MG capsule Take 1 capsule (2 mg total) by mouth as needed for diarrhea or loose stools. 30 capsule 2  . LORazepam (ATIVAN) 0.5 MG tablet Take 1 tablet (0.5 mg total) by mouth every 6 (six) hours as needed (Nausea or vomiting). 30 tablet 0  . metroNIDAZOLE (METROGEL) 1 % gel Apply topically daily.    . ondansetron (ZOFRAN) 8 MG tablet Take 1 tablet (8 mg total) by mouth 2 (two) times daily. Start the day after chemo for 3 days. Then take as needed for nausea or vomiting. 30 tablet 1  . potassium chloride SA (K-DUR,KLOR-CON) 20 MEQ tablet Take 1 tablet (20 mEq total) by mouth daily. 30 tablet 0  . prochlorperazine (COMPAZINE) 10 MG tablet Take 1 tablet (10 mg total) by mouth every 6 (six) hours as needed (Nausea or vomiting). 30 tablet 1  . simvastatin (ZOCOR) 20 MG tablet Take 20 mg by mouth daily at 6 PM.      No current facility-administered medications for this visit.   Facility-Administered Medications Ordered in Other Visits  Medication Dose Route Frequency Provider Last Rate Last Dose  . Influenza vac split quadrivalent PF (FLUARIX) injection 0.5 mL  0.5 mL Intramuscular Tomorrow-1000 Rulon Eisenmenger, MD        PHYSICAL EXAMINATION: ECOG PERFORMANCE STATUS: 1 - Symptomatic but completely ambulatory  Filed Vitals:   11/05/14 0840  BP: 128/82  Pulse: 87  Temp: 98.7 F (37.1 C)  Resp: 18   Filed Weights   11/05/14 0840  Weight: 180 lb 3.2 oz (81.738 kg)    GENERAL:alert, no distress and comfortable SKIN: skin color, texture, turgor are normal, no rashes or significant lesions EYES: normal, Conjunctiva are pink and non-injected, sclera clear OROPHARYNX:no exudate, no erythema and lips, buccal mucosa, and tongue normal  NECK: supple, thyroid normal size, non-tender, without nodularity LYMPH:  no palpable lymphadenopathy in  the cervical, axillary or inguinal LUNGS: clear to auscultation and percussion with normal breathing effort HEART: regular rate & rhythm and no murmurs and no lower extremity edema ABDOMEN:abdomen soft, non-tender and normal bowel sounds Musculoskeletal:no cyanosis of digits and no clubbing  NEURO: alert & oriented x 3 with fluent speech, no focal motor/sensory deficits   LABORATORY DATA:  I have reviewed the data as listed   Chemistry      Component Value Date/Time   NA 142 11/05/2014 0807   K 4.1 11/05/2014 0807   CO2 24 11/05/2014 0807   BUN 14.1 11/05/2014 0807   CREATININE 0.9 11/05/2014 0807      Component Value Date/Time   CALCIUM 10.0 11/05/2014 0807   ALKPHOS 112 11/05/2014 0807   AST 21 11/05/2014 0807   ALT 17 11/05/2014 0807   BILITOT 0.39 11/05/2014 0807       Lab Results  Component Value Date   WBC 7.2 11/05/2014   HGB 11.0* 11/05/2014   HCT 33.4* 11/05/2014   MCV 96.0 11/05/2014   PLT 395 11/05/2014   NEUTROABS  5.9 11/05/2014    ASSESSMENT & PLAN:  Breast cancer of upper-outer quadrant of left female breast Left breast invasive ductal carcinoma grade 3 ER 0% PR 0% HER-2 positive ratio 3.74, Ki-67 90%: 2.3 cm mass by ultrasound clinical stage: T2, N0, M0 stage II A.  Current treatment:Today is cycle 4 of neoadjuvant chemotherapy with Taxotere, carboplatin, Herceptin and Perjeta every 3 weeks.  Toxicities of chemotherapy: 1. Severe sore throat: previously treated with antibiotics, Nexium was added on 09/27/2014 and this led to resolution of symptoms 2. Alopecia 3. Fatigue 4. Mild fever related to sinus infection which has now resolved 5. Chemotherapy-induced anemia: Stable 6. Hypokalemia 7. Diarrhea related to Perjeta: Currently on Imodium and Lomotil. Instructed her to take probiotics I reviewed her blood counts that adequate for treatment today. Return to clinic in 3 weeks for cycle #5   Orders Placed This Encounter  Procedures  . CBC with  Differential    Standing Status: Future     Number of Occurrences:      Standing Expiration Date: 11/05/2015  . Comprehensive metabolic panel (Cmet) - [facility]    Standing Status: Future     Number of Occurrences:      Standing Expiration Date: 11/05/2015  . 2D Echocardiogram without contrast    Dr. Haroldine Laws to read    Standing Status: Future     Number of Occurrences:      Standing Expiration Date: 11/05/2015    Scheduling Instructions:     PROVIDERS If this is NOT for EF then please REASON that  needs COMPLETE STUDY    Order Specific Question:  Type of Echo    Answer:  Complete    Order Specific Question:  Reason for Exam    Answer:  Every three-month echocardiogram on Herceptin and Perjeta    Order Specific Question:  Where should this test be performed    Answer:  [facility]   The patient has a good understanding of the overall plan. she agrees with it. She will call with any problems that may develop before her next visit here.   Rulon Eisenmenger, MD 11/05/2014 9:12 AM

## 2014-11-06 ENCOUNTER — Ambulatory Visit (HOSPITAL_BASED_OUTPATIENT_CLINIC_OR_DEPARTMENT_OTHER): Payer: BLUE CROSS/BLUE SHIELD

## 2014-11-06 DIAGNOSIS — Z5189 Encounter for other specified aftercare: Secondary | ICD-10-CM

## 2014-11-06 DIAGNOSIS — C50412 Malignant neoplasm of upper-outer quadrant of left female breast: Secondary | ICD-10-CM

## 2014-11-06 DIAGNOSIS — C50212 Malignant neoplasm of upper-inner quadrant of left female breast: Secondary | ICD-10-CM

## 2014-11-06 MED ORDER — PEGFILGRASTIM INJECTION 6 MG/0.6ML ~~LOC~~
6.0000 mg | PREFILLED_SYRINGE | Freq: Once | SUBCUTANEOUS | Status: AC
  Administered 2014-11-06: 6 mg via SUBCUTANEOUS

## 2014-11-06 NOTE — Patient Instructions (Signed)
Pegfilgrastim injection What is this medicine? PEGFILGRASTIM (peg fil GRA stim) is a long-acting granulocyte colony-stimulating factor that stimulates the growth of neutrophils, a type of white blood cell important in the body's fight against infection. It is used to reduce the incidence of fever and infection in patients with certain types of cancer who are receiving chemotherapy that affects the bone marrow. This medicine may be used for other purposes; ask your health care provider or pharmacist if you have questions. COMMON BRAND NAME(S): Neulasta What should I tell my health care provider before I take this medicine? They need to know if you have any of these conditions: -latex allergy -ongoing radiation therapy -sickle cell disease -skin reactions to acrylic adhesives (On-Body Injector only) -an unusual or allergic reaction to pegfilgrastim, filgrastim, other medicines, foods, dyes, or preservatives -pregnant or trying to get pregnant -breast-feeding How should I use this medicine? This medicine is for injection under the skin. If you get this medicine at home, you will be taught how to prepare and give the pre-filled syringe or how to use the On-body Injector. Refer to the patient Instructions for Use for detailed instructions. Use exactly as directed. Take your medicine at regular intervals. Do not take your medicine more often than directed. It is important that you put your used needles and syringes in a special sharps container. Do not put them in a trash can. If you do not have a sharps container, call your pharmacist or healthcare provider to get one. Talk to your pediatrician regarding the use of this medicine in children. Special care may be needed. Overdosage: If you think you have taken too much of this medicine contact a poison control center or emergency room at once. NOTE: This medicine is only for you. Do not share this medicine with others. What if I miss a dose? It is  important not to miss your dose. Call your doctor or health care professional if you miss your dose. If you miss a dose due to an On-body Injector failure or leakage, a new dose should be administered as soon as possible using a single prefilled syringe for manual use. What may interact with this medicine? Interactions have not been studied. Give your health care provider a list of all the medicines, herbs, non-prescription drugs, or dietary supplements you use. Also tell them if you smoke, drink alcohol, or use illegal drugs. Some items may interact with your medicine. This list may not describe all possible interactions. Give your health care provider a list of all the medicines, herbs, non-prescription drugs, or dietary supplements you use. Also tell them if you smoke, drink alcohol, or use illegal drugs. Some items may interact with your medicine. What should I watch for while using this medicine? You may need blood work done while you are taking this medicine. If you are going to need a MRI, CT scan, or other procedure, tell your doctor that you are using this medicine (On-Body Injector only). What side effects may I notice from receiving this medicine? Side effects that you should report to your doctor or health care professional as soon as possible: -allergic reactions like skin rash, itching or hives, swelling of the face, lips, or tongue -dizziness -fever -pain, redness, or irritation at site where injected -pinpoint red spots on the skin -shortness of breath or breathing problems -stomach or side pain, or pain at the shoulder -swelling -tiredness -trouble passing urine Side effects that usually do not require medical attention (report to your doctor   or health care professional if they continue or are bothersome): -bone pain -muscle pain This list may not describe all possible side effects. Call your doctor for medical advice about side effects. You may report side effects to FDA at  1-800-FDA-1088. Where should I keep my medicine? Keep out of the reach of children. Store pre-filled syringes in a refrigerator between 2 and 8 degrees C (36 and 46 degrees F). Do not freeze. Keep in carton to protect from light. Throw away this medicine if it is left out of the refrigerator for more than 48 hours. Throw away any unused medicine after the expiration date. NOTE: This sheet is a summary. It may not cover all possible information. If you have questions about this medicine, talk to your doctor, pharmacist, or health care provider.  2015, Elsevier/Gold Standard. (2014-01-14 16:14:05)  

## 2014-11-08 ENCOUNTER — Telehealth: Payer: Self-pay | Admitting: Hematology and Oncology

## 2014-11-09 ENCOUNTER — Encounter: Payer: Self-pay | Admitting: Hematology and Oncology

## 2014-11-09 NOTE — Progress Notes (Signed)
Rcvd approval for [employer] but for the wrong effective date.  Attempted to call [employer] to correct the problem and due to unusually high call volume they are not accepting calls.

## 2014-11-11 ENCOUNTER — Encounter: Payer: Self-pay | Admitting: Nurse Practitioner

## 2014-11-11 ENCOUNTER — Ambulatory Visit (HOSPITAL_BASED_OUTPATIENT_CLINIC_OR_DEPARTMENT_OTHER): Payer: BLUE CROSS/BLUE SHIELD

## 2014-11-11 ENCOUNTER — Ambulatory Visit (HOSPITAL_BASED_OUTPATIENT_CLINIC_OR_DEPARTMENT_OTHER): Payer: BLUE CROSS/BLUE SHIELD | Admitting: Nurse Practitioner

## 2014-11-11 ENCOUNTER — Telehealth: Payer: Self-pay | Admitting: Nurse Practitioner

## 2014-11-11 ENCOUNTER — Telehealth: Payer: Self-pay | Admitting: *Deleted

## 2014-11-11 ENCOUNTER — Other Ambulatory Visit: Payer: Self-pay | Admitting: *Deleted

## 2014-11-11 VITALS — BP 88/52 | HR 98 | Temp 98.1°F | Resp 18 | Ht 62.0 in | Wt 173.2 lb

## 2014-11-11 DIAGNOSIS — R197 Diarrhea, unspecified: Secondary | ICD-10-CM

## 2014-11-11 DIAGNOSIS — C50212 Malignant neoplasm of upper-inner quadrant of left female breast: Secondary | ICD-10-CM

## 2014-11-11 DIAGNOSIS — R110 Nausea: Secondary | ICD-10-CM

## 2014-11-11 DIAGNOSIS — C50412 Malignant neoplasm of upper-outer quadrant of left female breast: Secondary | ICD-10-CM

## 2014-11-11 DIAGNOSIS — D709 Neutropenia, unspecified: Secondary | ICD-10-CM

## 2014-11-11 DIAGNOSIS — R55 Syncope and collapse: Secondary | ICD-10-CM

## 2014-11-11 DIAGNOSIS — E860 Dehydration: Secondary | ICD-10-CM

## 2014-11-11 LAB — COMPREHENSIVE METABOLIC PANEL (CC13)
ALT: 22 U/L (ref 0–55)
AST: 19 U/L (ref 5–34)
Albumin: 3.9 g/dL (ref 3.5–5.0)
Alkaline Phosphatase: 132 U/L (ref 40–150)
Anion Gap: 10 mEq/L (ref 3–11)
BILIRUBIN TOTAL: 0.65 mg/dL (ref 0.20–1.20)
BUN: 16.8 mg/dL (ref 7.0–26.0)
CALCIUM: 9.3 mg/dL (ref 8.4–10.4)
CHLORIDE: 104 meq/L (ref 98–109)
CO2: 26 meq/L (ref 22–29)
CREATININE: 0.9 mg/dL (ref 0.6–1.1)
EGFR: 68 mL/min/{1.73_m2} — AB (ref >90)
Glucose: 120 mg/dl (ref 70–140)
Potassium: 3.7 mEq/L (ref 3.5–5.1)
Sodium: 139 mEq/L (ref 136–145)
Total Protein: 6.7 g/dL (ref 6.4–8.3)

## 2014-11-11 LAB — CBC WITH DIFFERENTIAL/PLATELET
BASO%: 0.5 % (ref 0.0–2.0)
BASOS ABS: 0 10*3/uL (ref 0.0–0.1)
EOS ABS: 0 10*3/uL (ref 0.0–0.5)
EOS%: 1 % (ref 0.0–7.0)
HEMATOCRIT: 33.8 % — AB (ref 34.8–46.6)
HEMOGLOBIN: 11 g/dL — AB (ref 11.6–15.9)
LYMPH%: 33.2 % (ref 14.0–49.7)
MCH: 31.3 pg (ref 25.1–34.0)
MCHC: 32.6 g/dL (ref 31.5–36.0)
MCV: 96.1 fL (ref 79.5–101.0)
MONO#: 0.4 10*3/uL (ref 0.1–0.9)
MONO%: 23 % — ABNORMAL HIGH (ref 0.0–14.0)
NEUT%: 42.3 % (ref 38.4–76.8)
NEUTROS ABS: 0.7 10*3/uL — AB (ref 1.5–6.5)
Platelets: 123 10*3/uL — ABNORMAL LOW (ref 145–400)
RBC: 3.52 10*6/uL — AB (ref 3.70–5.45)
RDW: 19.5 % — ABNORMAL HIGH (ref 11.2–14.5)
WBC: 1.7 10*3/uL — ABNORMAL LOW (ref 3.9–10.3)
lymph#: 0.6 10*3/uL — ABNORMAL LOW (ref 0.9–3.3)

## 2014-11-11 LAB — MAGNESIUM (CC13): MAGNESIUM: 1.9 mg/dL (ref 1.5–2.5)

## 2014-11-11 MED ORDER — SODIUM CHLORIDE 0.9 % IV SOLN
INTRAVENOUS | Status: DC
  Administered 2014-11-11: 15:00:00 via INTRAVENOUS

## 2014-11-11 MED ORDER — LIDOCAINE-PRILOCAINE 2.5-2.5 % EX CREA
TOPICAL_CREAM | CUTANEOUS | Status: AC
  Filled 2014-11-11: qty 5

## 2014-11-11 MED ORDER — ONDANSETRON 8 MG/NS 50 ML IVPB
INTRAVENOUS | Status: AC
  Filled 2014-11-11: qty 8

## 2014-11-11 MED ORDER — ONDANSETRON 8 MG/50ML IVPB (CHCC)
8.0000 mg | Freq: Once | INTRAVENOUS | Status: AC
  Administered 2014-11-11: 8 mg via INTRAVENOUS

## 2014-11-11 NOTE — Patient Instructions (Signed)
Dehydration, Adult Dehydration is when you lose more fluids from the body than you take in. Vital organs like the kidneys, brain, and heart cannot function without a proper amount of fluids and salt. Any loss of fluids from the body can cause dehydration.  CAUSES   Vomiting.  Diarrhea.  Excessive sweating.  Excessive urine output.  Fever. SYMPTOMS  Mild dehydration  Thirst.  Dry lips.  Slightly dry mouth. Moderate dehydration  Very dry mouth.  Sunken eyes.  Skin does not bounce back quickly when lightly pinched and released.  Dark urine and decreased urine production.  Decreased tear production.  Headache. Severe dehydration  Very dry mouth.  Extreme thirst.  Rapid, weak pulse (more than 100 beats per minute at rest).  Cold hands and feet.  Not able to sweat in spite of heat and temperature.  Rapid breathing.  Blue lips.  Confusion and lethargy.  Difficulty being awakened.  Minimal urine production.  No tears. DIAGNOSIS  Your caregiver will diagnose dehydration based on your symptoms and your exam. Blood and urine tests will help confirm the diagnosis. The diagnostic evaluation should also identify the cause of dehydration. TREATMENT  Treatment of mild or moderate dehydration can often be done at home by increasing the amount of fluids that you drink. It is best to drink small amounts of fluid more often. Drinking too much at one time can make vomiting worse. Refer to the home care instructions below. Severe dehydration needs to be treated at the hospital where you will probably be given intravenous (IV) fluids that contain water and electrolytes. HOME CARE INSTRUCTIONS   Ask your caregiver about specific rehydration instructions.  Drink enough fluids to keep your urine clear or pale yellow.  Drink small amounts frequently if you have nausea and vomiting.  Eat as you normally do.  Avoid:  Foods or drinks high in sugar.  Carbonated  drinks.  Juice.  Extremely hot or cold fluids.  Drinks with caffeine.  Fatty, greasy foods.  Alcohol.  Tobacco.  Overeating.  Gelatin desserts.  Wash your hands well to avoid spreading bacteria and viruses.  Only take over-the-counter or prescription medicines for pain, discomfort, or fever as directed by your caregiver.  Ask your caregiver if you should continue all prescribed and over-the-counter medicines.  Keep all follow-up appointments with your caregiver. SEEK MEDICAL CARE IF:  You have abdominal pain and it increases or stays in one area (localizes).  You have a rash, stiff neck, or severe headache.  You are irritable, sleepy, or difficult to awaken.  You are weak, dizzy, or extremely thirsty. SEEK IMMEDIATE MEDICAL CARE IF:   You are unable to keep fluids down or you get worse despite treatment.  You have frequent episodes of vomiting or diarrhea.  You have blood or green matter (bile) in your vomit.  You have blood in your stool or your stool looks black and tarry.  You have not urinated in 6 to 8 hours, or you have only urinated a small amount of very dark urine.  You have a fever.  You faint. MAKE SURE YOU:   Understand these instructions.  Will watch your condition.  Will get help right away if you are not doing well or get worse. Document Released: 10/15/2005 Document Revised: 01/07/2012 Document Reviewed: 06/04/2011 Exit[employer] Patient Information 2015 [employer]. This information is not intended to replace advice given to you by your health care provider. Make sure you discuss any questions you have with your health care   provider.  

## 2014-11-11 NOTE — Assessment & Plan Note (Signed)
Does appear the patient experienced a full syncopal episode while at the gym today.  Patient has been suffering with some chronic nausea, minimal oral intake, and diarrhea since her last chemotherapy last week.  She is most likely fairly dehydrated today.  She is also noted to be orthostatic with a standing blood pressure of 88/52 and a heart rate of 98.  Confirmed the patient is not taking any blood pressure medications whatsoever.  Advised patient to change positions slowly; and to take precautions against any falls.  Patient may very well need additional IV fluid rehydration tomorrow; but advised patient that we would speak first thing in the morning in regards to scheduling additional hydration.  Also, patient was encouraged to push fluids when she is at home.

## 2014-11-11 NOTE — Telephone Encounter (Signed)
11:15 Verbal order received and read back from Selena Lesser NP for patient to have lab work.  P.O.F. Generated and marked urgent.  Called patient at 11:30 am asjking to come in at 12:00 for lab.  Per [name], she is now at home from the [place] and wants to "get herself together" and come to [facility] at 1:00 pm.

## 2014-11-11 NOTE — Progress Notes (Signed)
will   [facility]   HPI: Julie [name] 59 y.o. female diagnosed with breast cancer.  Currently undergoing neoadjuvant carboplatin/Taxotere/Herceptin/Perjeta chemotherapy regimen.  Patient called the cancer Center today requesting urgent care visit.  Patient received cycle 4 of her neoadjuvant chemotherapy on 11/05/2014.  Since that time-patient has been experiencing some chronic nausea and minimal appetite.  She admits to minimal oral intake recently.  She's also been having chronic diarrhea; despite taking Imodium, Lomotil, and probiotics.  She does feel dehydrated today.  Patient was lifting weights at the gym this morning; apparently experienced a full syncopal episode.  EMS was called; and blood sugar on initial check was 180.  Vital signs were essentially stable; but patient refused transport to the emergency department for further evaluation.  Patient denies any headache, change in vision, or other neurological issues.  Patient denies any recent fevers or chills.  Patient states she feels much better now; but they very well need some IV fluid rehydration.   HPI  CURRENT THERAPY: Upcoming Treatment Dates - BREAST TCH q21d (order Herceptin separately) Days with orders from any treatment category:  11/26/2014      lidocaine-prilocaine (EMLA) cream      SCHEDULING COMMUNICATION      fosaprepitant (EMEND) 150 mg in sodium chloride 0.9 % 145 mL IVPB      ondansetron (ZOFRAN) IVPB 16 mg      Dexamethasone Sodium Phosphate (DECADRON) injection 12 mg      DOCEtaxel (TAXOTERE) 150 mg in dextrose 5 % 250 mL chemo infusion      CARBOplatin (PARAPLATIN) 700 mg in sodium chloride 0.9 % 250 mL chemo infusion      sodium chloride 0.9 % injection 10 mL      heparin lock flush 100 unit/mL      heparin lock flush 100 unit/mL      alteplase (CATHFLO ACTIVASE) injection 2 mg      sodium chloride 0.9 % injection 3 mL      Cold Pack 1 packet      diphenhydrAMINE (BENADRYL) injection 25  mg      famotidine (PEPCID) IVPB 20 mg      0.9 %  sodium chloride infusion      methylPREDNISolone sodium succinate (SOLU-MEDROL) 125 mg/2 mL injection 125 mg      EPINEPHrine (ADRENALIN) 0.1 MG/ML injection 0.25 mg      EPINEPHrine (ADRENALIN) 0.1 MG/ML injection 0.25 mg      EPINEPHrine (ADRENALIN) injection 0.5 mg      EPINEPHrine (ADRENALIN) injection 0.5 mg      diphenhydrAMINE (BENADRYL) injection 50 mg      albuterol (PROVENTIL) (2.5 MG/3ML) 0.083% nebulizer solution 2.5 mg      0.9 %  sodium chloride infusion      TREATMENT CONDITIONS 11/27/2014      lidocaine-prilocaine (EMLA) cream      SCHEDULING COMMUNICATION INJECTION      pegfilgrastim (NEULASTA) injection 6 mg 12/17/2014      lidocaine-prilocaine (EMLA) cream      SCHEDULING COMMUNICATION      fosaprepitant (EMEND) 150 mg in sodium chloride 0.9 % 145 mL IVPB      ondansetron (ZOFRAN) IVPB 16 mg      Dexamethasone Sodium Phosphate (DECADRON) injection 12 mg      DOCEtaxel (TAXOTERE) 150 mg in dextrose 5 % 250 mL chemo infusion      CARBOplatin (PARAPLATIN) 700 mg in sodium chloride 0.9 % 250 mL chemo infusion  sodium chloride 0.9 % injection 10 mL      heparin lock flush 100 unit/mL      heparin lock flush 100 unit/mL      alteplase (CATHFLO ACTIVASE) injection 2 mg      sodium chloride 0.9 % injection 3 mL      Cold Pack 1 packet      diphenhydrAMINE (BENADRYL) injection 25 mg      famotidine (PEPCID) IVPB 20 mg      0.9 %  sodium chloride infusion      methylPREDNISolone sodium succinate (SOLU-MEDROL) 125 mg/2 mL injection 125 mg      EPINEPHrine (ADRENALIN) 0.1 MG/ML injection 0.25 mg      EPINEPHrine (ADRENALIN) 0.1 MG/ML injection 0.25 mg      EPINEPHrine (ADRENALIN) injection 0.5 mg      EPINEPHrine (ADRENALIN) injection 0.5 mg      diphenhydrAMINE (BENADRYL) injection 50 mg      albuterol (PROVENTIL) (2.5 MG/3ML) 0.083% nebulizer solution 2.5 mg      0.9 %  sodium chloride infusion      TREATMENT  CONDITIONS    ROS  Past Medical History  Diagnosis Date  . Uterine cyst   . Ovarian cyst   . Irregular menstrual cycle   . Hot flashes   . Breast cancer of upper-outer quadrant of left female breast   . Wears glasses   . PONV (postoperative nausea and vomiting)     severe    Past Surgical History  Procedure Laterality Date  . Uterine ablation  2006  . Cesarean section  1991  . Colonoscopy    . Diagnostic laparoscopy  1990    ovarian cysy  . Upper gi endoscopy    . Dilation and curettage of uterus    . Portacath placement Left 08/18/2014    Procedure: INSERTION PORT-A-CATH;  Surgeon: [name], MD;  Location: [facility];  Service: General;  Laterality: Left;    has Breast cancer of upper-outer quadrant of left female breast; Mucositis due to chemotherapy; Diarrhea; Vaginal yeast infection; Dehydration; Nausea without vomiting; Neutropenia; and Syncope on her problem list.     is allergic to codeine; sulfa antibiotics; and penicillins.    Medication List       This list is accurate as of: 11/11/14  4:22 PM.  Always use your most recent med list.               buPROPion 150 MG 12 hr tablet  Commonly known as:  WELLBUTRIN SR  Take 150 mg by mouth every evening.     cetirizine 10 MG tablet  Commonly known as:  ZYRTEC  Take 10 mg by mouth daily.     cholestyramine 4 G packet  Commonly known as:  QUESTRAN  Take 1 packet (4 g total) by mouth 3 (three) times daily with meals.     dexamethasone 4 MG tablet  Commonly known as:  DECADRON  Take 2 tablets (8 mg total) by mouth 2 (two) times daily. Start the day before Taxotere. Then again the day after chemo for 3 days.     esomeprazole 40 MG capsule  Commonly known as:  NEXIUM  Take 1 capsule (40 mg total) by mouth daily at 12 noon.     fluticasone 50 MCG/ACT nasal spray  Commonly known as:  FLONASE  Place into both nostrils as needed for allergies or rhinitis.     HYDROcodone-acetaminophen  5-325 MG per tablet  Commonly  known as:  NORCO/VICODIN  Take 1-2 tablets by mouth every 4 (four) hours as needed for moderate pain or severe pain.     lidocaine-prilocaine cream  Commonly known as:  EMLA  Apply 1 application topically as needed.     loperamide 2 MG capsule  Commonly known as:  IMODIUM  Take 1 capsule (2 mg total) by mouth as needed for diarrhea or loose stools.     LORazepam 0.5 MG tablet  Commonly known as:  ATIVAN  Take 1 tablet (0.5 mg total) by mouth every 6 (six) hours as needed (Nausea or vomiting).     magic mouthwash w/lidocaine Soln  Take 5 mLs by mouth 4 (four) times daily as needed for mouth pain.     ondansetron 8 MG tablet  Commonly known as:  ZOFRAN  Take 1 tablet (8 mg total) by mouth 2 (two) times daily. Start the day after chemo for 3 days. Then take as needed for nausea or vomiting.     potassium chloride SA 20 MEQ tablet  Commonly known as:  K-DUR,KLOR-CON  Take 1 tablet (20 mEq total) by mouth daily.     prochlorperazine 10 MG tablet  Commonly known as:  COMPAZINE  Take 1 tablet (10 mg total) by mouth every 6 (six) hours as needed (Nausea or vomiting).     simvastatin 20 MG tablet  Commonly known as:  ZOCOR  Take 20 mg by mouth daily at 6 PM.         PHYSICAL EXAMINATION  Blood pressure 88/52, pulse 98, temperature 98.1 F (36.7 C), temperature source Oral, resp. rate 18, height 5' 2" (1.575 m), weight 173 lb 3.2 oz (78.563 kg), SpO2 99 %.  Physical Exam  Constitutional: She is oriented to person, place, and time. She appears dehydrated. She appears unhealthy.  HENT:  Head: Normocephalic and atraumatic.  Mouth/Throat: Oropharynx is clear and moist.  Eyes: Conjunctivae and EOM are normal. Pupils are equal, round, and reactive to light. Right eye exhibits no discharge. Left eye exhibits no discharge. No scleral icterus.  Neck: Normal range of motion. Neck supple. No JVD present. No tracheal deviation present. No thyromegaly present.    Cardiovascular: Normal rate, regular rhythm, normal heart sounds and intact distal pulses.   Pulmonary/Chest: Effort normal and breath sounds normal. No respiratory distress. She has no wheezes. She has no rales. She exhibits no tenderness.  Abdominal: Soft. Bowel sounds are normal. She exhibits no distension and no mass. There is no tenderness. There is no rebound and no guarding.  Musculoskeletal: Normal range of motion. She exhibits no edema or tenderness.  Lymphadenopathy:    She has no cervical adenopathy.  Neurological: She is alert and oriented to person, place, and time. Gait normal.  Skin: Skin is warm and dry. No rash noted. No erythema. There is pallor.  Psychiatric: Affect normal.  Nursing note and vitals reviewed.   LABORATORY DATA:. Appointment on 11/11/2014  Component Date Value Ref Range Status  . WBC 11/11/2014 1.7* 3.9 - 10.3 10e3/uL Final  . NEUT# 11/11/2014 0.7* 1.5 - 6.5 10e3/uL Final  . HGB 11/11/2014 11.0* 11.6 - 15.9 g/dL Final  . HCT 11/11/2014 33.8* 34.8 - 46.6 % Final  . Platelets 11/11/2014 123* 145 - 400 10e3/uL Final  . MCV 11/11/2014 96.1  79.5 - 101.0 fL Final  . MCH 11/11/2014 31.3  25.1 - 34.0 pg Final  . MCHC 11/11/2014 32.6  31.5 - 36.0 g/dL Final  . RBC 11/11/2014 3.52* 3.70 -  5.45 10e6/uL Final  . RDW 11/11/2014 19.5* 11.2 - 14.5 % Final  . lymph# 11/11/2014 0.6* 0.9 - 3.3 10e3/uL Final  . MONO# 11/11/2014 0.4  0.1 - 0.9 10e3/uL Final  . Eosinophils Absolute 11/11/2014 0.0  0.0 - 0.5 10e3/uL Final  . Basophils Absolute 11/11/2014 0.0  0.0 - 0.1 10e3/uL Final  . NEUT% 11/11/2014 42.3  38.4 - 76.8 % Final  . LYMPH% 11/11/2014 33.2  14.0 - 49.7 % Final  . MONO% 11/11/2014 23.0* 0.0 - 14.0 % Final  . EOS% 11/11/2014 1.0  0.0 - 7.0 % Final  . BASO% 11/11/2014 0.5  0.0 - 2.0 % Final  . Sodium 11/11/2014 139  136 - 145 mEq/L Final  . Potassium 11/11/2014 3.7  3.5 - 5.1 mEq/L Final  . Chloride 11/11/2014 104  98 - 109 mEq/L Final  . CO2 11/11/2014  26  22 - 29 mEq/L Final  . Glucose 11/11/2014 120  70 - 140 mg/dl Final  . BUN 11/11/2014 16.8  7.0 - 26.0 mg/dL Final  . Creatinine 11/11/2014 0.9  0.6 - 1.1 mg/dL Final  . Total Bilirubin 11/11/2014 0.65  0.20 - 1.20 mg/dL Final  . Alkaline Phosphatase 11/11/2014 132  40 - 150 U/L Final  . AST 11/11/2014 19  5 - 34 U/L Final  . ALT 11/11/2014 22  0 - 55 U/L Final  . Total Protein 11/11/2014 6.7  6.4 - 8.3 g/dL Final  . Albumin 11/11/2014 3.9  3.5 - 5.0 g/dL Final  . Calcium 11/11/2014 9.3  8.4 - 10.4 mg/dL Final  . Anion Gap 11/11/2014 10  3 - 11 mEq/L Final  . EGFR 11/11/2014 68* >90 ml/min/1.73 m2 Final   eGFR is calculated using the CKD-EPI Creatinine Equation (2009)  . Magnesium 11/11/2014 1.9  1.5 - 2.5 mg/dl Final     RADIOGRAPHIC STUDIES: No results found.  ASSESSMENT/PLAN:    Breast cancer of upper-outer quadrant of left female breast Patient received cycle 4 of her neoadjuvant carboplatin/Taxotere/Herceptin/Perjeta chemotherapy regimen on 11/05/2014.  She is scheduled to return for cycle 5 of the same regimen on 11/26/2014.  Also, patient is scheduled for repeat  echo on 11/22/2014.   Dehydration Patient has been suffering with some nausea; but no vomiting for the past several days post chemotherapy.  She also has been suffering with some chronic diarrhea post chemotherapy as well.  She does appear dehydrated today.  Patient will receive 1 L normal saline IV fluid rehydration today.  Patient was also encouraged to push fluids is much as possible at home.     Diarrhea Patient does suffer with some chronic diarrhea following each chemotherapy cycle.  She takes Imodium, Lomotil, and probiotics on an as-needed basis.   Nausea without vomiting Patient is complaining of some chronic nausea post chemotherapy; but reports she's had no vomiting.  She has had decreased appetite; and has had little oral intake.  Patient does have anti-emetics at home to use as needed.  Patient  was given Zofran 8 mg IV while at the [facility] today.   Neutropenia Patient is neutropenic today with an ANC of 0.7.  Reviewed all neutropenia guidelines with both patient and her husband again today.   Syncope Does appear the patient experienced a full syncopal episode while at the gym today.  Patient has been suffering with some chronic nausea, minimal oral intake, and diarrhea since her last chemotherapy last week.  She is most likely fairly dehydrated today.  She is also noted  to be orthostatic with a standing blood pressure of 88/52 and a heart rate of 98.  Confirmed the patient is not taking any blood pressure medications whatsoever.  Advised patient to change positions slowly; and to take precautions against any falls.  Patient may very well need additional IV fluid rehydration tomorrow; but advised patient that we would speak first thing in the morning in regards to scheduling additional hydration.  Also, patient was encouraged to push fluids when she is at home.   Patient stated understanding of all instructions; and was in agreement with this plan of care. The patient knows to call the clinic with any problems, questions or concerns.   Review/collaboration with Dr. Lindi Adie regarding all aspects of patient's visit today.   Total time spent with patient was 25 minutes;  with greater than 75 percent of that time spent in face to face counseling regarding her symptoms and coordination of care and follow up.  Disclaimer: This note was dictated with voice recognition software. Similar sounding words can inadvertently be transcribed and may not be corrected upon review.   Drue Second, NP 11/11/2014

## 2014-11-11 NOTE — Assessment & Plan Note (Signed)
Patient has been suffering with some nausea; but no vomiting for the past several days post chemotherapy.  She also has been suffering with some chronic diarrhea post chemotherapy as well.  She does appear dehydrated today.  Patient will receive 1 L normal saline IV fluid rehydration today.  Patient was also encouraged to push fluids is much as possible at home.

## 2014-11-11 NOTE — Progress Notes (Signed)
@   1405-Due to postural hypotension, applied EMLA cream to PAC site per patient request. She will probably require IV hydration today.

## 2014-11-11 NOTE — Telephone Encounter (Signed)
@  LOGO@ Provider input needed: Appointment, dizziness, diarrhea  Patient Name: Julie [name]  MRN: [id number] DOB: 30-May-1956  Date: @TODAY @ Telephone: [phone] (home) [phone] (work) CSN: [id number]   Allergies: is allergic to codeine; sulfa antibiotics; and penicillins.      Reason for call:  Chief Complaint  Patient presents with  . Appointment  . Dizziness  . Diarrhea  . "Fainting episode"      Patient last received chemotherapy/ treatment on: 1-8-20106 D1C4 Herceptin, perjetta, taxotere, carboplatin.  Patient was last seen in the office on 11-05-2014  Next appt is 11-26-2014     Is patient having fevers greater than 100.5? Yes []    No [x]    Comments:    Is patient having uncontrolled pain, or new pain? Yes []    No [x]    Comments:   Is patient having new back pain that changes with position Yes []                                   (worsens or eases when laying down?) No []    Comments:     Is patient able to eat and drink? Yes []    No [x]    Comments:"She drinks a fair amount but she is not eating as much.    Is patient able to pass stool without difficulty?  Yes []    No [x]    Comments:"She's having diarrhr=ea off and on since 11-05-2014 treatment."    Is patient having uncontrolled nausea?  Yes []    No [x]    Comments:"Nausea but she hasn't vomited."  Spouse [name] calls 11/11/2014 with complaint of  "She needs to be seen, EMT is here but she refuses to go to the ER.  She went to the Gym and had a fainting episode.  Is Dr. Lindi Adie not available? "  Summary Based on the above information advised family to  Encourage her to go to the ER.  Silence from [name].  This nurse will notify [facility].  [name] can be reached at [phone].   [place], [place] Maiorino  11/11/2014, 11:03 AM

## 2014-11-11 NOTE — Assessment & Plan Note (Signed)
Patient does suffer with some chronic diarrhea following each chemotherapy cycle.  She takes Imodium, Lomotil, and probiotics on an as-needed basis.

## 2014-11-11 NOTE — Assessment & Plan Note (Signed)
Patient is complaining of some chronic nausea post chemotherapy; but reports she's had no vomiting.  She has had decreased appetite; and has had little oral intake.  Patient does have anti-emetics at home to use as needed.  Patient was given Zofran 8 mg IV while at the [facility] today.

## 2014-11-11 NOTE — Assessment & Plan Note (Signed)
Patient is neutropenic today with an ANC of 0.7.  Reviewed all neutropenia guidelines with both patient and her husband again today.

## 2014-11-11 NOTE — Telephone Encounter (Signed)
Per 01/14 POF, scheduled pt for labs and NP/CB.... Cherylann Banas

## 2014-11-11 NOTE — Assessment & Plan Note (Addendum)
Patient received cycle 4 of her neoadjuvant carboplatin/Taxotere/Herceptin/Perjeta chemotherapy regimen on 11/05/2014.  She is scheduled to return for cycle 5 of the same regimen on 11/26/2014.  Also, patient is scheduled for repeat  echo on 11/22/2014.

## 2014-11-12 ENCOUNTER — Telehealth: Payer: Self-pay | Admitting: *Deleted

## 2014-11-12 NOTE — Telephone Encounter (Signed)
This RN spoke with patient regarding blood pressure and syncopal episode. Patient stated," I don't have a way of checking my blood pressure at home. I'm not dizzy or lightheaded. I don't have any diarrhea today. I have already had 48 ounces of water this morning, and I'm keeping chicken broth down. Patient denied N/V/D. I instructed the patient to call [phone] if she had any problems or concerns over the weekend. Patient verbalized understanding.

## 2014-11-12 NOTE — Telephone Encounter (Signed)
-----   Message from Drue Second, NP sent at 11/11/2014  4:33 PM EST ----- PROVIDER:  [place] Triage: follow up call 24-48 hours please.

## 2014-11-15 ENCOUNTER — Encounter: Payer: Self-pay | Admitting: Hematology and Oncology

## 2014-11-15 ENCOUNTER — Encounter: Payer: Self-pay | Admitting: Nurse Practitioner

## 2014-11-17 ENCOUNTER — Telehealth: Payer: Self-pay

## 2014-11-17 NOTE — Telephone Encounter (Signed)
Pt requested letter stating she is undergoing chemo.  Let pt know I would put it in the mail to her today.  Pt voiced understanding.  Letter generated, signed by Dr. Lindi Adie, and put in the mail.

## 2014-11-20 ENCOUNTER — Other Ambulatory Visit: Payer: Self-pay | Admitting: Hematology and Oncology

## 2014-11-20 DIAGNOSIS — C50412 Malignant neoplasm of upper-outer quadrant of left female breast: Secondary | ICD-10-CM

## 2014-11-22 ENCOUNTER — Ambulatory Visit (HOSPITAL_COMMUNITY)
Admission: RE | Admit: 2014-11-22 | Discharge: 2014-11-22 | Disposition: A | Discharge status: Home / Self Care | Payer: BLUE CROSS/BLUE SHIELD | Source: Ambulatory Visit | Attending: Hematology and Oncology | Admitting: Hematology and Oncology

## 2014-11-22 DIAGNOSIS — C50919 Malignant neoplasm of unspecified site of unspecified female breast: Secondary | ICD-10-CM | POA: Diagnosis not present

## 2014-11-22 DIAGNOSIS — C50412 Malignant neoplasm of upper-outer quadrant of left female breast: Secondary | ICD-10-CM

## 2014-11-22 NOTE — Progress Notes (Signed)
  Echocardiogram 2D Echocardiogram has been performed.  Julie [name] 11/22/2014, 11:11 AM

## 2014-11-26 ENCOUNTER — Ambulatory Visit (HOSPITAL_BASED_OUTPATIENT_CLINIC_OR_DEPARTMENT_OTHER): Payer: BLUE CROSS/BLUE SHIELD | Admitting: Hematology and Oncology

## 2014-11-26 ENCOUNTER — Telehealth: Payer: Self-pay | Admitting: Hematology and Oncology

## 2014-11-26 ENCOUNTER — Encounter: Payer: Self-pay | Admitting: *Deleted

## 2014-11-26 ENCOUNTER — Other Ambulatory Visit: Payer: 59

## 2014-11-26 ENCOUNTER — Ambulatory Visit (HOSPITAL_BASED_OUTPATIENT_CLINIC_OR_DEPARTMENT_OTHER): Payer: BLUE CROSS/BLUE SHIELD

## 2014-11-26 ENCOUNTER — Telehealth: Payer: Self-pay | Admitting: *Deleted

## 2014-11-26 ENCOUNTER — Other Ambulatory Visit: Payer: Self-pay | Admitting: *Deleted

## 2014-11-26 ENCOUNTER — Other Ambulatory Visit (HOSPITAL_BASED_OUTPATIENT_CLINIC_OR_DEPARTMENT_OTHER): Payer: BLUE CROSS/BLUE SHIELD

## 2014-11-26 VITALS — BP 125/66 | HR 93 | Temp 98.6°F | Resp 18 | Ht 62.0 in | Wt 176.7 lb

## 2014-11-26 DIAGNOSIS — C50412 Malignant neoplasm of upper-outer quadrant of left female breast: Secondary | ICD-10-CM

## 2014-11-26 DIAGNOSIS — Z5111 Encounter for antineoplastic chemotherapy: Secondary | ICD-10-CM

## 2014-11-26 DIAGNOSIS — C50212 Malignant neoplasm of upper-inner quadrant of left female breast: Secondary | ICD-10-CM

## 2014-11-26 DIAGNOSIS — D6481 Anemia due to antineoplastic chemotherapy: Secondary | ICD-10-CM

## 2014-11-26 DIAGNOSIS — E876 Hypokalemia: Secondary | ICD-10-CM

## 2014-11-26 DIAGNOSIS — Z5112 Encounter for antineoplastic immunotherapy: Secondary | ICD-10-CM

## 2014-11-26 DIAGNOSIS — R5382 Chronic fatigue, unspecified: Secondary | ICD-10-CM

## 2014-11-26 DIAGNOSIS — R197 Diarrhea, unspecified: Secondary | ICD-10-CM

## 2014-11-26 LAB — COMPREHENSIVE METABOLIC PANEL (CC13)
ALT: 19 U/L (ref 0–55)
ANION GAP: 12 meq/L — AB (ref 3–11)
AST: 17 U/L (ref 5–34)
Albumin: 3.5 g/dL (ref 3.5–5.0)
Alkaline Phosphatase: 90 U/L (ref 40–150)
BILIRUBIN TOTAL: 0.26 mg/dL (ref 0.20–1.20)
BUN: 15.1 mg/dL (ref 7.0–26.0)
CALCIUM: 9 mg/dL (ref 8.4–10.4)
CO2: 24 mEq/L (ref 22–29)
CREATININE: 0.9 mg/dL (ref 0.6–1.1)
Chloride: 107 mEq/L (ref 98–109)
EGFR: 69 mL/min/{1.73_m2} — AB (ref >90)
GLUCOSE: 198 mg/dL — AB (ref 70–140)
Potassium: 4 mEq/L (ref 3.5–5.1)
Sodium: 142 mEq/L (ref 136–145)
TOTAL PROTEIN: 6.3 g/dL — AB (ref 6.4–8.3)

## 2014-11-26 LAB — CBC WITH DIFFERENTIAL/PLATELET
BASO%: 0.1 % (ref 0.0–2.0)
BASOS ABS: 0 10*3/uL (ref 0.0–0.1)
EOS ABS: 0 10*3/uL (ref 0.0–0.5)
EOS%: 0 % (ref 0.0–7.0)
HEMATOCRIT: 29.5 % — AB (ref 34.8–46.6)
HGB: 9.8 g/dL — ABNORMAL LOW (ref 11.6–15.9)
LYMPH%: 10 % — ABNORMAL LOW (ref 14.0–49.7)
MCH: 32.8 pg (ref 25.1–34.0)
MCHC: 33.1 g/dL (ref 31.5–36.0)
MCV: 99 fL (ref 79.5–101.0)
MONO#: 0.3 10*3/uL (ref 0.1–0.9)
MONO%: 3.2 % (ref 0.0–14.0)
NEUT#: 9.1 10*3/uL — ABNORMAL HIGH (ref 1.5–6.5)
NEUT%: 86.7 % — AB (ref 38.4–76.8)
Platelets: 207 10*3/uL (ref 145–400)
RBC: 2.98 10*6/uL — AB (ref 3.70–5.45)
RDW: 20 % — AB (ref 11.2–14.5)
WBC: 10.5 10*3/uL — AB (ref 3.9–10.3)
lymph#: 1.1 10*3/uL (ref 0.9–3.3)

## 2014-11-26 MED ORDER — DOCETAXEL CHEMO INJECTION 160 MG/16ML
65.0000 mg/m2 | Freq: Once | INTRAVENOUS | Status: AC
  Administered 2014-11-26: 130 mg via INTRAVENOUS
  Filled 2014-11-26: qty 13

## 2014-11-26 MED ORDER — DIPHENHYDRAMINE HCL 25 MG PO CAPS
50.0000 mg | ORAL_CAPSULE | Freq: Once | ORAL | Status: AC
  Administered 2014-11-26: 50 mg via ORAL

## 2014-11-26 MED ORDER — HEPARIN SOD (PORK) LOCK FLUSH 100 UNIT/ML IV SOLN
500.0000 [IU] | Freq: Once | INTRAVENOUS | Status: AC | PRN
  Administered 2014-11-26: 500 [IU]
  Filled 2014-11-26: qty 5

## 2014-11-26 MED ORDER — DIPHENHYDRAMINE HCL 25 MG PO CAPS
ORAL_CAPSULE | ORAL | Status: AC
  Filled 2014-11-26: qty 2

## 2014-11-26 MED ORDER — SODIUM CHLORIDE 0.9 % IJ SOLN
10.0000 mL | INTRAMUSCULAR | Status: DC | PRN
  Administered 2014-11-26: 10 mL
  Filled 2014-11-26: qty 10

## 2014-11-26 MED ORDER — FOSAPREPITANT DIMEGLUMINE INJECTION 150 MG
150.0000 mg | Freq: Once | INTRAVENOUS | Status: AC
  Administered 2014-11-26: 150 mg via INTRAVENOUS
  Filled 2014-11-26: qty 5

## 2014-11-26 MED ORDER — SODIUM CHLORIDE 0.9 % IV SOLN
626.5000 mg | Freq: Once | INTRAVENOUS | Status: AC
  Administered 2014-11-26: 630 mg via INTRAVENOUS
  Filled 2014-11-26: qty 63

## 2014-11-26 MED ORDER — TRASTUZUMAB CHEMO INJECTION 440 MG
6.0000 mg/kg | Freq: Once | INTRAVENOUS | Status: AC
  Administered 2014-11-26: 462 mg via INTRAVENOUS
  Filled 2014-11-26: qty 22

## 2014-11-26 MED ORDER — SODIUM CHLORIDE 0.9 % IV SOLN
Freq: Once | INTRAVENOUS | Status: AC
  Administered 2014-11-26: 10:00:00 via INTRAVENOUS

## 2014-11-26 MED ORDER — ONDANSETRON 16 MG/50ML IVPB (CHCC)
16.0000 mg | Freq: Once | INTRAVENOUS | Status: AC
  Administered 2014-11-26: 16 mg via INTRAVENOUS

## 2014-11-26 MED ORDER — SODIUM CHLORIDE 0.9 % IV SOLN: INTRAVENOUS | Status: DC

## 2014-11-26 MED ORDER — PERTUZUMAB CHEMO INJECTION 420 MG/14ML
420.0000 mg | Freq: Once | INTRAVENOUS | Status: AC
  Administered 2014-11-26: 420 mg via INTRAVENOUS
  Filled 2014-11-26: qty 14

## 2014-11-26 MED ORDER — ACETAMINOPHEN 325 MG PO TABS
650.0000 mg | ORAL_TABLET | Freq: Once | ORAL | Status: AC
  Administered 2014-11-26: 650 mg via ORAL

## 2014-11-26 MED ORDER — ACETAMINOPHEN 325 MG PO TABS
ORAL_TABLET | ORAL | Status: AC
  Filled 2014-11-26: qty 2

## 2014-11-26 MED ORDER — DEXAMETHASONE SODIUM PHOSPHATE 20 MG/5ML IJ SOLN
INTRAMUSCULAR | Status: AC
  Filled 2014-11-26: qty 5

## 2014-11-26 MED ORDER — ONDANSETRON 16 MG/50ML IVPB (CHCC)
INTRAVENOUS | Status: AC
  Filled 2014-11-26: qty 16

## 2014-11-26 MED ORDER — DEXAMETHASONE SODIUM PHOSPHATE 20 MG/5ML IJ SOLN
12.0000 mg | Freq: Once | INTRAMUSCULAR | Status: AC
  Administered 2014-11-26: 12 mg via INTRAVENOUS

## 2014-11-26 NOTE — Progress Notes (Signed)
Patient Care Team: [name], MD as PCP - General (Internal Medicine)  DIAGNOSIS: Breast cancer of upper-outer quadrant of left female breast   Staging form: Breast, AJCC 7th Edition     Clinical: Stage IIA (T2, N0, cM0) - Unsigned       Staging comments: Staged at breast conference 08/11/14.      Pathologic: No stage assigned - Unsigned   SUMMARY OF ONCOLOGIC HISTORY:   Breast cancer of upper-outer quadrant of left female breast   08/05/2014 Mammogram Left breast 10 o clock: 2.3X 2X1.4 cm lobular hypoechoeic   08/05/2014 Initial Biopsy Grade 3 IDC Er 0%, PR 0%, Her 2 positive Ratio 3.74; Ki 67: 90%   08/17/2014 Breast MRI Left breast known malignancy upper inner quadrant 2.6 cm: Suspicious non-mass enhancement upper-inner quadrant right breast biopsy pending   08/27/2014 -  Chemotherapy Neoadjuvant chemotherapy with Taxotere, carboplatin, Herceptin, Perjeta every 3 weeks x6 cycles of Herceptin maintenance for one year.    CHIEF COMPLIANT: Cycle 5 TCH Perjeta  INTERVAL HISTORY: Julie [name] is a 59 year old lady with above-mentioned history of left-sided breast cancer currently on neoadjuvant chemotherapy with TCH Perjeta. She is had more fatigue and more tiredness. She also had profound diarrhea and she collapsed in the gym. She had to be brought in and get IV fluids in our clinic. She feels that the diarrhea has been her biggest problem and the fatigue as well. Denies any major trouble with vomiting but she had nausea multiple times.  REVIEW OF SYSTEMS:   Constitutional: Denies fevers, chills or abnormal weight loss Eyes: Denies blurriness of vision Ears, nose, mouth, throat, and face: Denies mucositis or sore throat Respiratory: Denies cough, dyspnea or wheezes Cardiovascular: Denies palpitation, chest discomfort or lower extremity swelling Gastrointestinal:  Denies nausea, heartburn or change in bowel habits Skin: Denies abnormal skin rashes Lymphatics: Denies new  lymphadenopathy or easy bruising Neurological:Denies numbness, tingling or new weaknesses Behavioral/Psych: Mood is stable, no new changes  All other systems were reviewed with the patient and are negative.  I have reviewed the past medical history, past surgical history, social history and family history with the patient and they are unchanged from previous note.  ALLERGIES:  is allergic to codeine; sulfa antibiotics; and penicillins.  MEDICATIONS:  Current Outpatient Prescriptions  Medication Sig Dispense Refill  . Alum & Mag Hydroxide-Simeth (MAGIC MOUTHWASH W/LIDOCAINE) SOLN Take 5 mLs by mouth 4 (four) times daily as needed for mouth pain. 240 mL 0  . buPROPion (WELLBUTRIN SR) 150 MG 12 hr tablet Take 150 mg by mouth every evening.     . cetirizine (ZYRTEC) 10 MG tablet Take 10 mg by mouth daily.    . cholestyramine (QUESTRAN) 4 G packet Take 1 packet (4 g total) by mouth 3 (three) times daily with meals. 60 each 3  . dexamethasone (DECADRON) 4 MG tablet Take 2 tablets (8 mg total) by mouth 2 (two) times daily. Start the day before Taxotere. Then again the day after chemo for 3 days. 30 tablet 1  . esomeprazole (NEXIUM) 40 MG capsule Take 1 capsule (40 mg total) by mouth daily at 12 noon. 30 capsule 3  . fluticasone (FLONASE) 50 MCG/ACT nasal spray Place into both nostrils as needed for allergies or rhinitis.    Marland Kitchen HYDROcodone-acetaminophen (NORCO/VICODIN) 5-325 MG per tablet Take 1-2 tablets by mouth every 4 (four) hours as needed for moderate pain or severe pain. 20 tablet 0  . lidocaine-prilocaine (EMLA) cream Apply 1 application topically  as needed. 30 g 6  . loperamide (IMODIUM) 2 MG capsule Take 1 capsule (2 mg total) by mouth as needed for diarrhea or loose stools. 30 capsule 2  . LORazepam (ATIVAN) 0.5 MG tablet Take 1 tablet (0.5 mg total) by mouth every 6 (six) hours as needed (Nausea or vomiting). 30 tablet 0  . ondansetron (ZOFRAN) 8 MG tablet Take 1 tablet (8 mg total) by  mouth 2 (two) times daily. Start the day after chemo for 3 days. Then take as needed for nausea or vomiting. 30 tablet 1  . potassium chloride SA (K-DUR,KLOR-CON) 20 MEQ tablet Take 1 tablet (20 mEq total) by mouth daily. 30 tablet 0  . prochlorperazine (COMPAZINE) 10 MG tablet TAKE 1 TABLET EVERY 6 HOURS AS NEEDED FOR NAUSEA & VOMITING. 30 tablet 0  . simvastatin (ZOCOR) 20 MG tablet Take 20 mg by mouth daily at 6 PM.      No current facility-administered medications for this visit.   Facility-Administered Medications Ordered in Other Visits  Medication Dose Route Frequency Provider Last Rate Last Dose  . Influenza vac split quadrivalent PF (FLUARIX) injection 0.5 mL  0.5 mL Intramuscular Tomorrow-1000 Rulon Eisenmenger, MD        PHYSICAL EXAMINATION: ECOG PERFORMANCE STATUS: 1 - Symptomatic but completely ambulatory  Filed Vitals:   11/26/14 0837  BP: 125/66  Pulse: 93  Temp: 98.6 F (37 C)  Resp: 18   Filed Weights   11/26/14 0837  Weight: 176 lb 11.2 oz (80.151 kg)    GENERAL:alert, no distress and comfortable SKIN: skin color, texture, turgor are normal, no rashes or significant lesions EYES: normal, Conjunctiva are pink and non-injected, sclera clear OROPHARYNX:no exudate, no erythema and lips, buccal mucosa, and tongue normal  NECK: supple, thyroid normal size, non-tender, without nodularity LYMPH:  no palpable lymphadenopathy in the cervical, axillary or inguinal LUNGS: clear to auscultation and percussion with normal breathing effort HEART: regular rate & rhythm and no murmurs and no lower extremity edema ABDOMEN:abdomen soft, non-tender and normal bowel sounds Musculoskeletal:no cyanosis of digits and no clubbing  NEURO: alert & oriented x 3 with fluent speech, no focal motor/sensory deficits   LABORATORY DATA:  I have reviewed the data as listed   Chemistry      Component Value Date/Time   NA 142 11/26/2014 0818   K 4.0 11/26/2014 0818   CO2 24 11/26/2014 0818    BUN 15.1 11/26/2014 0818   CREATININE 0.9 11/26/2014 0818      Component Value Date/Time   CALCIUM 9.0 11/26/2014 0818   ALKPHOS 90 11/26/2014 0818   AST 17 11/26/2014 0818   ALT 19 11/26/2014 0818   BILITOT 0.26 11/26/2014 0818       Lab Results  Component Value Date   WBC 10.5* 11/26/2014   HGB 9.8* 11/26/2014   HCT 29.5* 11/26/2014   MCV 99.0 11/26/2014   PLT 207 11/26/2014   NEUTROABS 9.1* 11/26/2014     RADIOGRAPHIC STUDIES: I have personally reviewed the radiology reports and agreed with their findings. The  ASSESSMENT & PLAN:  Breast cancer of upper-outer quadrant of left female breast Left breast invasive ductal carcinoma grade 3 ER 0% PR 0% HER-2 positive ratio 3.74, Ki-67 90%: 2.3 cm mass by ultrasound clinical stage: T2, N0, M0 stage II A.  Current treatment:Today is cycle 5 of neoadjuvant chemotherapy with Taxotere, carboplatin, Herceptin and Perjeta every 3 weeks.  Toxicities of chemotherapy: 1. Severe sore throat: previously treated with antibiotics, Nexium  was added on 09/27/2014 and this led to resolution of symptoms 2. Alopecia 3. Fatigue: Chemotherapy dosage will be decreased from cycle 5 4. Mild fever related to sinus infection which has now resolved 5. Chemotherapy-induced anemia: Stable 6. Hypokalemia 7. Diarrhea related to Perjeta: Currently on Imodium and Lomotil. Also takes probiotics I reviewed her blood counts that adequate for treatment today. Return to clinic in 3 weeks for cycle #6  Plan: 1. Order MRI breast 2. Present her in tumor board 3. Follow-up with surgery afterwards 4. Return to clinic in 3 weeks for last cycle of chemotherapy. I discussed with her that she does not need to be seen on the last treatment day. I will see her back after the MRI breast February 29 and tumor board presentation March 3 and follow-up afterwards to discuss the tumor board recommendations.   Orders Placed This Encounter  Procedures  . MR Breast  Bilateral W Contrast    Standing Status: Future     Number of Occurrences:      Standing Expiration Date: 11/26/2015    Order Specific Question:  Reason for Exam (SYMPTOM  OR DIAGNOSIS REQUIRED)    Answer:  Post neo adjuvant chemo    Order Specific Question:  Preferred imaging location?    Answer:  [facility]    Order Specific Question:  Does the patient have a pacemaker or implanted devices?    Answer:  No    Order Specific Question:  What is the patient's sedation requirement?    Answer:  No Sedation  . MR Breast Bilateral Wo Contrast    Standing Status: Future     Number of Occurrences:      Standing Expiration Date: 11/26/2015    Order Specific Question:  Reason for Exam (SYMPTOM  OR DIAGNOSIS REQUIRED)    Answer:  Post-neoadjuvant chemo    Order Specific Question:  Preferred imaging location?    Answer:  [facility]    Order Specific Question:  Does the patient have a pacemaker or implanted devices?    Answer:  No    Order Specific Question:  What is the patient's sedation requirement?    Answer:  No Sedation  . CBC with Differential    Standing Status: Future     Number of Occurrences:      Standing Expiration Date: 11/26/2015  . Comprehensive metabolic panel (Cmet) - [facility]    Standing Status: Future     Number of Occurrences:      Standing Expiration Date: 11/26/2015   The patient has a good understanding of the overall plan. she agrees with it. She will call with any problems that may develop before her next visit here.   Rulon Eisenmenger, MD

## 2014-11-26 NOTE — Telephone Encounter (Signed)
Per staff message and POF I have scheduled appts. Advised scheduler of appts. JMW  

## 2014-11-26 NOTE — Assessment & Plan Note (Signed)
Left breast invasive ductal carcinoma grade 3 ER 0% PR 0% HER-2 positive ratio 3.74, Ki-67 90%: 2.3 cm mass by ultrasound clinical stage: T2, N0, M0 stage II A.  Current treatment:Today is cycle 5 of neoadjuvant chemotherapy with Taxotere, carboplatin, Herceptin and Perjeta every 3 weeks.  Toxicities of chemotherapy: 1. Severe sore throat: previously treated with antibiotics, Nexium was added on 09/27/2014 and this led to resolution of symptoms 2. Alopecia 3. Fatigue 4. Mild fever related to sinus infection which has now resolved 5. Chemotherapy-induced anemia: Stable 6. Hypokalemia 7. Diarrhea related to Perjeta: Currently on Imodium and Lomotil. Also takes probiotics I reviewed her blood counts that adequate for treatment today. Return to clinic in 3 weeks for cycle #6  Plan: 1. Order MRI breast 2. Present her in tumor board 3. Follow-up with surgery afterwards 4. Return to clinic in 3 weeks for last cycle of chemotherapy. I discussed with her that she does not need to be seen on the last treatment day. I will see her back after the MRI breast and tumor board presentation in 6 weeks which will start the maintenance Herceptin.

## 2014-11-26 NOTE — Telephone Encounter (Signed)
, °

## 2014-11-26 NOTE — Patient Instructions (Signed)
[facility] Discharge Instructions for Patients Receiving Chemotherapy  Today you received the following chemotherapy agents: Herceptin, Perjeta, Taxotere, Carboplatin  To help prevent nausea and vomiting after your treatment, we encourage you to take your nausea medication as prescribed.    If you develop nausea and vomiting that is not controlled by your nausea medication, call the clinic.   BELOW ARE SYMPTOMS THAT SHOULD BE REPORTED IMMEDIATELY:  *FEVER GREATER THAN 100.5 F  *CHILLS WITH OR WITHOUT FEVER  NAUSEA AND VOMITING THAT IS NOT CONTROLLED WITH YOUR NAUSEA MEDICATION  *UNUSUAL SHORTNESS OF BREATH  *UNUSUAL BRUISING OR BLEEDING  TENDERNESS IN MOUTH AND THROAT WITH OR WITHOUT PRESENCE OF ULCERS  *URINARY PROBLEMS  *BOWEL PROBLEMS  UNUSUAL RASH Items with * indicate a potential emergency and should be followed up as soon as possible.  Feel free to call the clinic you have any questions or concerns. The clinic phone number is [phone].

## 2014-11-26 NOTE — Progress Notes (Signed)
Spoke with patient in chemo today. She is feeling better.  After her last treatment she suffered on syncope episode while at the gym.  Patient will receive IVF's next week.  Encouraged her to call with any needs or concerns.

## 2014-11-27 ENCOUNTER — Ambulatory Visit: Payer: 59

## 2014-11-29 ENCOUNTER — Ambulatory Visit (HOSPITAL_BASED_OUTPATIENT_CLINIC_OR_DEPARTMENT_OTHER): Payer: BLUE CROSS/BLUE SHIELD

## 2014-11-29 ENCOUNTER — Telehealth: Payer: Self-pay | Admitting: *Deleted

## 2014-11-29 ENCOUNTER — Telehealth: Payer: Self-pay | Admitting: Hematology and Oncology

## 2014-11-29 DIAGNOSIS — C50212 Malignant neoplasm of upper-inner quadrant of left female breast: Secondary | ICD-10-CM

## 2014-11-29 DIAGNOSIS — Z5189 Encounter for other specified aftercare: Secondary | ICD-10-CM

## 2014-11-29 DIAGNOSIS — C50412 Malignant neoplasm of upper-outer quadrant of left female breast: Secondary | ICD-10-CM

## 2014-11-29 MED ORDER — PEGFILGRASTIM INJECTION 6 MG/0.6ML ~~LOC~~
6.0000 mg | PREFILLED_SYRINGE | Freq: Once | SUBCUTANEOUS | Status: AC
  Administered 2014-11-29: 6 mg via SUBCUTANEOUS
  Filled 2014-11-29: qty 0.6

## 2014-11-29 MED ORDER — LIDOCAINE-PRILOCAINE 2.5-2.5 % EX CREA: 1.0000 "application " | TOPICAL_CREAM | CUTANEOUS | Status: DC | PRN

## 2014-11-29 NOTE — Telephone Encounter (Signed)
Per staff message and POF I have scheduled appts. Advised scheduler of appts. JMW  

## 2014-11-29 NOTE — Telephone Encounter (Signed)
, °

## 2014-12-01 ENCOUNTER — Ambulatory Visit (HOSPITAL_BASED_OUTPATIENT_CLINIC_OR_DEPARTMENT_OTHER): Payer: BLUE CROSS/BLUE SHIELD

## 2014-12-01 ENCOUNTER — Other Ambulatory Visit: Payer: Self-pay | Admitting: *Deleted

## 2014-12-01 ENCOUNTER — Encounter: Payer: Self-pay | Admitting: *Deleted

## 2014-12-01 DIAGNOSIS — C50212 Malignant neoplasm of upper-inner quadrant of left female breast: Secondary | ICD-10-CM

## 2014-12-01 DIAGNOSIS — E860 Dehydration: Secondary | ICD-10-CM

## 2014-12-01 DIAGNOSIS — C50412 Malignant neoplasm of upper-outer quadrant of left female breast: Secondary | ICD-10-CM

## 2014-12-01 MED ORDER — HEPARIN SOD (PORK) LOCK FLUSH 100 UNIT/ML IV SOLN
500.0000 [IU] | Freq: Once | INTRAVENOUS | Status: AC
  Administered 2014-12-01: 500 [IU] via INTRAVENOUS
  Filled 2014-12-01: qty 5

## 2014-12-01 MED ORDER — SODIUM CHLORIDE 0.9 % IJ SOLN
10.0000 mL | INTRAMUSCULAR | Status: DC | PRN
  Administered 2014-12-01: 10 mL via INTRAVENOUS
  Filled 2014-12-01: qty 10

## 2014-12-01 MED ORDER — SODIUM CHLORIDE 0.9 % IV SOLN
INTRAVENOUS | Status: DC
  Administered 2014-12-01: 16:00:00 via INTRAVENOUS

## 2014-12-01 MED ORDER — ONDANSETRON HCL 8 MG PO TABS: 8.0000 mg | ORAL_TABLET | Freq: Two times a day (BID) | ORAL | Status: DC

## 2014-12-01 NOTE — Patient Instructions (Signed)

## 2014-12-01 NOTE — Progress Notes (Signed)
Spoke with patient today while she was getting IVF's.  Patient states she has feelst jittery and weak today.  Patient is requesting a refill on Zofran she states it works better than compazine for her nausea.  Smells really seem to bother her and she states the zofran helps.  Refill sent to her pharmacy.  Encouraged her to call with any needs or concerns.  Discussed with her that she may want to get IVF's after her next treatment if this seems to help her feel better.  Will continue to follow.

## 2014-12-06 ENCOUNTER — Telehealth: Payer: Self-pay | Admitting: *Deleted

## 2014-12-06 ENCOUNTER — Other Ambulatory Visit: Payer: Self-pay | Admitting: *Deleted

## 2014-12-06 DIAGNOSIS — C50412 Malignant neoplasm of upper-outer quadrant of left female breast: Secondary | ICD-10-CM

## 2014-12-06 MED ORDER — SODIUM CHLORIDE 0.9 % IV SOLN: 1000.0000 mL | Freq: Once | INTRAVENOUS | Status: DC

## 2014-12-06 NOTE — Telephone Encounter (Signed)
Received call from patient stating she would like to have IVF's for 12/22/14.  I have sent the POF to get these scheduled.  Patient is also in question whether she needs to see a midlevel next week while Dr. Lindi Adie is out on [place].  She states she doesn't feel she needs to but if she changes her mind she will let me know.

## 2014-12-06 NOTE — Telephone Encounter (Signed)
Per staff message and POF I have scheduled appts. Advised breast navigator of appts. JMW

## 2014-12-17 ENCOUNTER — Other Ambulatory Visit (HOSPITAL_BASED_OUTPATIENT_CLINIC_OR_DEPARTMENT_OTHER): Payer: BLUE CROSS/BLUE SHIELD

## 2014-12-17 ENCOUNTER — Ambulatory Visit (HOSPITAL_BASED_OUTPATIENT_CLINIC_OR_DEPARTMENT_OTHER): Payer: BLUE CROSS/BLUE SHIELD

## 2014-12-17 DIAGNOSIS — C50412 Malignant neoplasm of upper-outer quadrant of left female breast: Secondary | ICD-10-CM

## 2014-12-17 DIAGNOSIS — Z5111 Encounter for antineoplastic chemotherapy: Secondary | ICD-10-CM

## 2014-12-17 DIAGNOSIS — C50212 Malignant neoplasm of upper-inner quadrant of left female breast: Secondary | ICD-10-CM

## 2014-12-17 DIAGNOSIS — Z5112 Encounter for antineoplastic immunotherapy: Secondary | ICD-10-CM

## 2014-12-17 LAB — CBC WITH DIFFERENTIAL/PLATELET
BASO%: 0 % (ref 0.0–2.0)
BASOS ABS: 0 10*3/uL (ref 0.0–0.1)
EOS ABS: 0 10*3/uL (ref 0.0–0.5)
EOS%: 0 % (ref 0.0–7.0)
HEMATOCRIT: 27.9 % — AB (ref 34.8–46.6)
HGB: 9.1 g/dL — ABNORMAL LOW (ref 11.6–15.9)
LYMPH%: 12.6 % — AB (ref 14.0–49.7)
MCH: 33.3 pg (ref 25.1–34.0)
MCHC: 32.6 g/dL (ref 31.5–36.0)
MCV: 102.2 fL — ABNORMAL HIGH (ref 79.5–101.0)
MONO#: 0.2 10*3/uL (ref 0.1–0.9)
MONO%: 3 % (ref 0.0–14.0)
NEUT%: 84.4 % — AB (ref 38.4–76.8)
NEUTROS ABS: 6.2 10*3/uL (ref 1.5–6.5)
PLATELETS: 127 10*3/uL — AB (ref 145–400)
RBC: 2.73 10*6/uL — AB (ref 3.70–5.45)
RDW: 18.3 % — ABNORMAL HIGH (ref 11.2–14.5)
WBC: 7.4 10*3/uL (ref 3.9–10.3)
lymph#: 0.9 10*3/uL (ref 0.9–3.3)

## 2014-12-17 LAB — COMPREHENSIVE METABOLIC PANEL (CC13)
ALT: 19 U/L (ref 0–55)
AST: 18 U/L (ref 5–34)
Albumin: 3.4 g/dL — ABNORMAL LOW (ref 3.5–5.0)
Alkaline Phosphatase: 82 U/L (ref 40–150)
Anion Gap: 13 mEq/L — ABNORMAL HIGH (ref 3–11)
BILIRUBIN TOTAL: 0.31 mg/dL (ref 0.20–1.20)
BUN: 14.3 mg/dL (ref 7.0–26.0)
CO2: 22 meq/L (ref 22–29)
CREATININE: 0.9 mg/dL (ref 0.6–1.1)
Calcium: 8.2 mg/dL — ABNORMAL LOW (ref 8.4–10.4)
Chloride: 106 mEq/L (ref 98–109)
EGFR: 69 mL/min/{1.73_m2} — ABNORMAL LOW (ref >90)
Glucose: 210 mg/dl — ABNORMAL HIGH (ref 70–140)
POTASSIUM: 3.5 meq/L (ref 3.5–5.1)
SODIUM: 141 meq/L (ref 136–145)
Total Protein: 6.2 g/dL — ABNORMAL LOW (ref 6.4–8.3)

## 2014-12-17 MED ORDER — SODIUM CHLORIDE 0.9 % IJ SOLN
10.0000 mL | INTRAMUSCULAR | Status: DC | PRN
  Administered 2014-12-17: 10 mL
  Filled 2014-12-17: qty 10

## 2014-12-17 MED ORDER — DEXAMETHASONE SODIUM PHOSPHATE 20 MG/5ML IJ SOLN
INTRAMUSCULAR | Status: AC
  Filled 2014-12-17: qty 5

## 2014-12-17 MED ORDER — ONDANSETRON 16 MG/50ML IVPB (CHCC)
INTRAVENOUS | Status: AC
  Filled 2014-12-17: qty 16

## 2014-12-17 MED ORDER — DOCETAXEL CHEMO INJECTION 160 MG/16ML
65.0000 mg/m2 | Freq: Once | INTRAVENOUS | Status: AC
  Administered 2014-12-17: 130 mg via INTRAVENOUS
  Filled 2014-12-17: qty 13

## 2014-12-17 MED ORDER — DEXAMETHASONE SODIUM PHOSPHATE 20 MG/5ML IJ SOLN
12.0000 mg | Freq: Once | INTRAMUSCULAR | Status: AC
  Administered 2014-12-17: 12 mg via INTRAVENOUS

## 2014-12-17 MED ORDER — HEPARIN SOD (PORK) LOCK FLUSH 100 UNIT/ML IV SOLN
500.0000 [IU] | Freq: Once | INTRAVENOUS | Status: AC | PRN
  Administered 2014-12-17: 500 [IU]
  Filled 2014-12-17: qty 5

## 2014-12-17 MED ORDER — ACETAMINOPHEN 325 MG PO TABS
650.0000 mg | ORAL_TABLET | Freq: Once | ORAL | Status: AC
  Administered 2014-12-17: 650 mg via ORAL

## 2014-12-17 MED ORDER — CARBOPLATIN CHEMO INJECTION 600 MG/60ML
626.5000 mg | Freq: Once | INTRAVENOUS | Status: AC
  Administered 2014-12-17: 630 mg via INTRAVENOUS
  Filled 2014-12-17: qty 63

## 2014-12-17 MED ORDER — DIPHENHYDRAMINE HCL 25 MG PO CAPS
50.0000 mg | ORAL_CAPSULE | Freq: Once | ORAL | Status: AC
  Administered 2014-12-17: 50 mg via ORAL

## 2014-12-17 MED ORDER — SODIUM CHLORIDE 0.9 % IV SOLN
150.0000 mg | Freq: Once | INTRAVENOUS | Status: AC
  Administered 2014-12-17: 150 mg via INTRAVENOUS
  Filled 2014-12-17: qty 5

## 2014-12-17 MED ORDER — SODIUM CHLORIDE 0.9 % IV SOLN
INTRAVENOUS | Status: DC
  Administered 2014-12-17: 09:00:00 via INTRAVENOUS

## 2014-12-17 MED ORDER — SODIUM CHLORIDE 0.9 % IV SOLN
420.0000 mg | Freq: Once | INTRAVENOUS | Status: AC
  Administered 2014-12-17: 420 mg via INTRAVENOUS
  Filled 2014-12-17: qty 14

## 2014-12-17 MED ORDER — SODIUM CHLORIDE 0.9 % IV SOLN
Freq: Once | INTRAVENOUS | Status: AC
  Administered 2014-12-17: 09:00:00 via INTRAVENOUS

## 2014-12-17 MED ORDER — TRASTUZUMAB CHEMO INJECTION 440 MG
6.0000 mg/kg | Freq: Once | INTRAVENOUS | Status: AC
  Administered 2014-12-17: 483 mg via INTRAVENOUS
  Filled 2014-12-17: qty 23

## 2014-12-17 MED ORDER — ONDANSETRON 16 MG/50ML IVPB (CHCC)
16.0000 mg | Freq: Once | INTRAVENOUS | Status: AC
  Administered 2014-12-17: 16 mg via INTRAVENOUS

## 2014-12-17 NOTE — Patient Instructions (Addendum)
[facility] Discharge Instructions for Patients Receiving Chemotherapy  Today you received the following chemotherapy agents:  Herceptin, Perjeta, Taxotere and Carboplatin  To help prevent nausea and vomiting after your treatment, we encourage you to take your nausea medication as ordered per MD.   If you develop nausea and vomiting that is not controlled by your nausea medication, call the clinic.   BELOW ARE SYMPTOMS THAT SHOULD BE REPORTED IMMEDIATELY:  *FEVER GREATER THAN 100.5 F  *CHILLS WITH OR WITHOUT FEVER  NAUSEA AND VOMITING THAT IS NOT CONTROLLED WITH YOUR NAUSEA MEDICATION  *UNUSUAL SHORTNESS OF BREATH  *UNUSUAL BRUISING OR BLEEDING  TENDERNESS IN MOUTH AND THROAT WITH OR WITHOUT PRESENCE OF ULCERS  *URINARY PROBLEMS  *BOWEL PROBLEMS  UNUSUAL RASH Items with * indicate a potential emergency and should be followed up as soon as possible.  Feel free to call the clinic you have any questions or concerns. The clinic phone number is [phone].   Carboplatin injection What is this medicine? CARBOPLATIN (KAR boe pla tin) is a chemotherapy drug. It targets fast dividing cells, like cancer cells, and causes these cells to die. This medicine is used to treat ovarian cancer and many other cancers. This medicine may be used for other purposes; ask your health care provider or pharmacist if you have questions. COMMON BRAND NAME(S): Paraplatin What should I tell my health care provider before I take this medicine? They need to know if you have any of these conditions: -blood disorders -hearing problems -kidney disease -recent or ongoing radiation therapy -an unusual or allergic reaction to carboplatin, cisplatin, other chemotherapy, other medicines, foods, dyes, or preservatives -pregnant or trying to get pregnant -breast-feeding How should I use this medicine? This drug is usually given as an infusion into a vein. It is administered in a hospital or  clinic by a specially trained health care professional. Talk to your pediatrician regarding the use of this medicine in children. Special care may be needed. Overdosage: If you think you have taken too much of this medicine contact a poison control center or emergency room at once. NOTE: This medicine is only for you. Do not share this medicine with others. What if I miss a dose? It is important not to miss a dose. Call your doctor or health care professional if you are unable to keep an appointment. What may interact with this medicine? -medicines for seizures -medicines to increase blood counts like filgrastim, pegfilgrastim, sargramostim -some antibiotics like amikacin, gentamicin, neomycin, streptomycin, tobramycin -vaccines Talk to your doctor or health care professional before taking any of these medicines: -acetaminophen -aspirin -ibuprofen -ketoprofen -naproxen This list may not describe all possible interactions. Give your health care provider a list of all the medicines, herbs, non-prescription drugs, or dietary supplements you use. Also tell them if you smoke, drink alcohol, or use illegal drugs. Some items may interact with your medicine. What should I watch for while using this medicine? Your condition will be monitored carefully while you are receiving this medicine. You will need important blood work done while you are taking this medicine. This drug may make you feel generally unwell. This is not uncommon, as chemotherapy can affect healthy cells as well as cancer cells. Report any side effects. Continue your course of treatment even though you feel ill unless your doctor tells you to stop. In some cases, you may be given additional medicines to help with side effects. Follow all directions for their use. Call your doctor or health  care professional for advice if you get a fever, chills or sore throat, or other symptoms of a cold or flu. Do not treat yourself. This drug  decreases your body's ability to fight infections. Try to avoid being around people who are sick. This medicine may increase your risk to bruise or bleed. Call your doctor or health care professional if you notice any unusual bleeding. Be careful brushing and flossing your teeth or using a toothpick because you may get an infection or bleed more easily. If you have any dental work done, tell your dentist you are receiving this medicine. Avoid taking products that contain aspirin, acetaminophen, ibuprofen, naproxen, or ketoprofen unless instructed by your doctor. These medicines may hide a fever. Do not become pregnant while taking this medicine. Women should inform their doctor if they wish to become pregnant or think they might be pregnant. There is a potential for serious side effects to an unborn child. Talk to your health care professional or pharmacist for more information. Do not breast-feed an infant while taking this medicine. What side effects may I notice from receiving this medicine? Side effects that you should report to your doctor or health care professional as soon as possible: -allergic reactions like skin rash, itching or hives, swelling of the face, lips, or tongue -signs of infection - fever or chills, cough, sore throat, pain or difficulty passing urine -signs of decreased platelets or bleeding - bruising, pinpoint red spots on the skin, black, tarry stools, nosebleeds -signs of decreased red blood cells - unusually weak or tired, fainting spells, lightheadedness -breathing problems -changes in hearing -changes in vision -chest pain -high blood pressure -low blood counts - This drug may decrease the number of white blood cells, red blood cells and platelets. You may be at increased risk for infections and bleeding. -nausea and vomiting -pain, swelling, redness or irritation at the injection site -pain, tingling, numbness in the hands or feet -problems with balance, talking,  walking -trouble passing urine or change in the amount of urine Side effects that usually do not require medical attention (report to your doctor or health care professional if they continue or are bothersome): -hair loss -loss of appetite -metallic taste in the mouth or changes in taste This list may not describe all possible side effects. Call your doctor for medical advice about side effects. You may report side effects to FDA at 1-800-FDA-1088. Where should I keep my medicine? This drug is given in a hospital or clinic and will not be stored at home. NOTE: This sheet is a summary. It may not cover all possible information. If you have questions about this medicine, talk to your doctor, pharmacist, or health care provider.  2015, [employer] Standard. (2008-01-20 14:38:05) Docetaxel injection What is this medicine? DOCETAXEL (doe se TAX el) is a chemotherapy drug. It targets fast dividing cells, like cancer cells, and causes these cells to die. This medicine is used to treat many types of cancers like breast cancer, certain stomach cancers, head and neck cancer, lung cancer, and prostate cancer. This medicine may be used for other purposes; ask your health care provider or pharmacist if you have questions. COMMON BRAND NAME(S): Docefrez, Taxotere What should I tell my health care provider before I take this medicine? They need to know if you have any of these conditions: -infection (especially a virus infection such as chickenpox, cold sores, or herpes) -liver disease -low blood counts, like low white cell, platelet, or red cell counts -an unusual  or allergic reaction to docetaxel, polysorbate 80, other chemotherapy agents, other medicines, foods, dyes, or preservatives -pregnant or trying to get pregnant -breast-feeding How should I use this medicine? This drug is given as an infusion into a vein. It is administered in a hospital or clinic by a specially trained health care  professional. Talk to your pediatrician regarding the use of this medicine in children. Special care may be needed. Overdosage: If you think you have taken too much of this medicine contact a poison control center or emergency room at once. NOTE: This medicine is only for you. Do not share this medicine with others. What if I miss a dose? It is important not to miss your dose. Call your doctor or health care professional if you are unable to keep an appointment. What may interact with this medicine? -cyclosporine -erythromycin -ketoconazole -medicines to increase blood counts like filgrastim, pegfilgrastim, sargramostim -vaccines Talk to your doctor or health care professional before taking any of these medicines: -acetaminophen -aspirin -ibuprofen -ketoprofen -naproxen This list may not describe all possible interactions. Give your health care provider a list of all the medicines, herbs, non-prescription drugs, or dietary supplements you use. Also tell them if you smoke, drink alcohol, or use illegal drugs. Some items may interact with your medicine. What should I watch for while using this medicine? Your condition will be monitored carefully while you are receiving this medicine. You will need important blood work done while you are taking this medicine. This drug may make you feel generally unwell. This is not uncommon, as chemotherapy can affect healthy cells as well as cancer cells. Report any side effects. Continue your course of treatment even though you feel ill unless your doctor tells you to stop. In some cases, you may be given additional medicines to help with side effects. Follow all directions for their use. Call your doctor or health care professional for advice if you get a fever, chills or sore throat, or other symptoms of a cold or flu. Do not treat yourself. This drug decreases your body's ability to fight infections. Try to avoid being around people who are sick. This  medicine may increase your risk to bruise or bleed. Call your doctor or health care professional if you notice any unusual bleeding. Be careful brushing and flossing your teeth or using a toothpick because you may get an infection or bleed more easily. If you have any dental work done, tell your dentist you are receiving this medicine. Avoid taking products that contain aspirin, acetaminophen, ibuprofen, naproxen, or ketoprofen unless instructed by your doctor. These medicines may hide a fever. This medicine contains an alcohol in the product. You may get drowsy or dizzy. Do not drive, use machinery, or do anything that needs mental alertness until you know how this medicine affects you. Do not stand or sit up quickly, especially if you are an older patient. This reduces the risk of dizzy or fainting spells. Avoid alcoholic drinks Do not become pregnant while taking this medicine. Women should inform their doctor if they wish to become pregnant or think they might be pregnant. There is a potential for serious side effects to an unborn child. Talk to your health care professional or pharmacist for more information. Do not breast-feed an infant while taking this medicine. What side effects may I notice from receiving this medicine? Side effects that you should report to your doctor or health care professional as soon as possible: -allergic reactions like skin rash, itching or  hives, swelling of the face, lips, or tongue -low blood counts - This drug may decrease the number of white blood cells, red blood cells and platelets. You may be at increased risk for infections and bleeding. -signs of infection - fever or chills, cough, sore throat, pain or difficulty passing urine -signs of decreased platelets or bleeding - bruising, pinpoint red spots on the skin, black, tarry stools, nosebleeds -signs of decreased red blood cells - unusually weak or tired, fainting spells, lightheadedness -breathing  problems -fast or irregular heartbeat -low blood pressure -mouth sores -nausea and vomiting -pain, swelling, redness or irritation at the injection site -pain, tingling, numbness in the hands or feet -swelling of the ankle, feet, hands -weight gain Side effects that usually do not require medical attention (report to your prescriber or health care professional if they continue or are bothersome): -bone pain -complete hair loss including hair on your head, underarms, pubic hair, eyebrows, and eyelashes -diarrhea -excessive tearing -changes in the color of fingernails -loosening of the fingernails -nausea -muscle pain -red flush to skin -sweating -weak or tired This list may not describe all possible side effects. Call your doctor for medical advice about side effects. You may report side effects to FDA at 1-800-FDA-1088. Where should I keep my medicine? This drug is given in a hospital or clinic and will not be stored at home. NOTE: This sheet is a summary. It may not cover all possible information. If you have questions about this medicine, talk to your doctor, pharmacist, or health care provider.  2015, [employer] Standard. (2013-09-10 22:21:02) Pertuzumab injection What is this medicine? PERTUZUMAB (per TOOZ ue mab) is a monoclonal antibody that targets a protein called HER2. HER2 is found in some breast cancers. This medicine can stop cancer cell growth. This medicine is used with other cancer treatments. This medicine may be used for other purposes; ask your health care provider or pharmacist if you have questions. COMMON BRAND NAME(S): PERJETA What should I tell my health care provider before I take this medicine? They need to know if you have any of these conditions: -heart disease -heart failure -high blood pressure -history of irregular heart beat -recent or ongoing radiation therapy -an unusual or allergic reaction to pertuzumab, other medicines, foods, dyes, or  preservatives -pregnant or trying to get pregnant -breast-feeding How should I use this medicine? This medicine is for infusion into a vein. It is given by a health care professional in a hospital or clinic setting. Talk to your pediatrician regarding the use of this medicine in children. Special care may be needed. Overdosage: If you think you've taken too much of this medicine contact a poison control center or emergency room at once. Overdosage: If you think you have taken too much of this medicine contact a poison control center or emergency room at once. NOTE: This medicine is only for you. Do not share this medicine with others. What if I miss a dose? It is important not to miss your dose. Call your doctor or health care professional if you are unable to keep an appointment. What may interact with this medicine? Interactions are not expected. Give your health care provider a list of all the medicines, herbs, non-prescription drugs, or dietary supplements you use. Also tell them if you smoke, drink alcohol, or use illegal drugs. Some items may interact with your medicine. This list may not describe all possible interactions. Give your health care provider a list of all the medicines, herbs, non-prescription drugs,  or dietary supplements you use. Also tell them if you smoke, drink alcohol, or use illegal drugs. Some items may interact with your medicine. What should I watch for while using this medicine? Your condition will be monitored carefully while you are receiving this medicine. Report any side effects. Continue your course of treatment even though you feel ill unless your doctor tells you to stop. Do not become pregnant while taking this medicine. Women should inform their doctor if they wish to become pregnant or think they might be pregnant. There is a potential for serious side effects to an unborn child. Talk to your health care professional or pharmacist for more information. Do not  breast-feed an infant while taking this medicine. Call your doctor or health care professional for advice if you get a fever, chills or sore throat, or other symptoms of a cold or flu. Do not treat yourself. Try to avoid being around people who are sick. You may experience fever, chills, and headache during the infusion. Report any side effects during the infusion to your health care professional. What side effects may I notice from receiving this medicine? Side effects that you should report to your doctor or health care professional as soon as possible: -breathing problems -chest pain or palpitations -dizziness -feeling faint or lightheaded -fever or chills -skin rash, itching or hives -sore throat -swelling of the face, lips, or tongue -swelling of the legs or ankles -unusually weak or tired Side effects that usually do not require medical attention (Report these to your doctor or health care professional if they continue or are bothersome.): -diarrhea -hair loss -nausea, vomiting -tiredness This list may not describe all possible side effects. Call your doctor for medical advice about side effects. You may report side effects to FDA at 1-800-FDA-1088. Where should I keep my medicine? This drug is given in a hospital or clinic and will not be stored at home. NOTE: This sheet is a summary. It may not cover all possible information. If you have questions about this medicine, talk to your doctor, pharmacist, or health care provider.  2015, [employer] Standard. (2012-08-13 16:54:15) Trastuzumab injection for infusion What is this medicine? TRASTUZUMAB (tras TOO zoo mab) is a monoclonal antibody. It targets a protein called HER2. This protein is found in some stomach and breast cancers. This medicine can stop cancer cell growth. This medicine may be used with other cancer treatments. This medicine may be used for other purposes; ask your health care provider or pharmacist if you have  questions. COMMON BRAND NAME(S): Herceptin What should I tell my health care provider before I take this medicine? They need to know if you have any of these conditions: -heart disease -heart failure -infection (especially a virus infection such as chickenpox, cold sores, or herpes) -lung or breathing disease, like asthma -recent or ongoing radiation therapy -an unusual or allergic reaction to trastuzumab, benzyl alcohol, or other medications, foods, dyes, or preservatives -pregnant or trying to get pregnant -breast-feeding How should I use this medicine? This drug is given as an infusion into a vein. It is administered in a hospital or clinic by a specially trained health care professional. Talk to your pediatrician regarding the use of this medicine in children. This medicine is not approved for use in children. Overdosage: If you think you have taken too much of this medicine contact a poison control center or emergency room at once. NOTE: This medicine is only for you. Do not share this medicine with others. What  if I miss a dose? It is important not to miss a dose. Call your doctor or health care professional if you are unable to keep an appointment. What may interact with this medicine? -cyclophosphamide -doxorubicin -warfarin This list may not describe all possible interactions. Give your health care provider a list of all the medicines, herbs, non-prescription drugs, or dietary supplements you use. Also tell them if you smoke, drink alcohol, or use illegal drugs. Some items may interact with your medicine. What should I watch for while using this medicine? Visit your doctor for checks on your progress. Report any side effects. Continue your course of treatment even though you feel ill unless your doctor tells you to stop. Call your doctor or health care professional for advice if you get a fever, chills or sore throat, or other symptoms of a cold or flu. Do not treat yourself. Try  to avoid being around people who are sick. You may experience fever, chills and shaking during your first infusion. These effects are usually mild and can be treated with other medicines. Report any side effects during the infusion to your health care professional. Fever and chills usually do not happen with later infusions. What side effects may I notice from receiving this medicine? Side effects that you should report to your doctor or other health care professional as soon as possible: -breathing difficulties -chest pain or palpitations -cough -dizziness or fainting -fever or chills, sore throat -skin rash, itching or hives -swelling of the legs or ankles -unusually weak or tired Side effects that usually do not require medical attention (report to your doctor or other health care professional if they continue or are bothersome): -loss of appetite -headache -muscle aches -nausea This list may not describe all possible side effects. Call your doctor for medical advice about side effects. You may report side effects to FDA at 1-800-FDA-1088. Where should I keep my medicine? This drug is given in a hospital or clinic and will not be stored at home. NOTE: This sheet is a summary. It may not cover all possible information. If you have questions about this medicine, talk to your doctor, pharmacist, or health care provider.  2015, [employer] Standard. (2009-08-19 13:43:15)

## 2014-12-18 ENCOUNTER — Ambulatory Visit: Payer: BLUE CROSS/BLUE SHIELD

## 2014-12-20 ENCOUNTER — Encounter: Payer: Self-pay | Admitting: Nurse Practitioner

## 2014-12-20 ENCOUNTER — Other Ambulatory Visit (HOSPITAL_BASED_OUTPATIENT_CLINIC_OR_DEPARTMENT_OTHER): Payer: BLUE CROSS/BLUE SHIELD

## 2014-12-20 ENCOUNTER — Telehealth: Payer: Self-pay

## 2014-12-20 ENCOUNTER — Telehealth: Payer: Self-pay | Admitting: Hematology and Oncology

## 2014-12-20 ENCOUNTER — Ambulatory Visit (HOSPITAL_BASED_OUTPATIENT_CLINIC_OR_DEPARTMENT_OTHER): Payer: BLUE CROSS/BLUE SHIELD | Admitting: Nurse Practitioner

## 2014-12-20 ENCOUNTER — Ambulatory Visit (HOSPITAL_BASED_OUTPATIENT_CLINIC_OR_DEPARTMENT_OTHER): Payer: BLUE CROSS/BLUE SHIELD

## 2014-12-20 VITALS — BP 122/77 | HR 71 | Temp 96.7°F | Resp 16 | Wt 170.2 lb

## 2014-12-20 DIAGNOSIS — R197 Diarrhea, unspecified: Secondary | ICD-10-CM

## 2014-12-20 DIAGNOSIS — C50412 Malignant neoplasm of upper-outer quadrant of left female breast: Secondary | ICD-10-CM

## 2014-12-20 DIAGNOSIS — D6481 Anemia due to antineoplastic chemotherapy: Secondary | ICD-10-CM

## 2014-12-20 DIAGNOSIS — T451X5A Adverse effect of antineoplastic and immunosuppressive drugs, initial encounter: Secondary | ICD-10-CM

## 2014-12-20 DIAGNOSIS — C50212 Malignant neoplasm of upper-inner quadrant of left female breast: Secondary | ICD-10-CM

## 2014-12-20 DIAGNOSIS — E876 Hypokalemia: Secondary | ICD-10-CM

## 2014-12-20 DIAGNOSIS — Z5189 Encounter for other specified aftercare: Secondary | ICD-10-CM

## 2014-12-20 LAB — CBC WITH DIFFERENTIAL/PLATELET
BASO%: 0.5 % (ref 0.0–2.0)
Basophils Absolute: 0 10*3/uL (ref 0.0–0.1)
EOS%: 0 % (ref 0.0–7.0)
Eosinophils Absolute: 0 10*3/uL (ref 0.0–0.5)
HEMATOCRIT: 26.6 % — AB (ref 34.8–46.6)
HEMOGLOBIN: 8.9 g/dL — AB (ref 11.6–15.9)
LYMPH%: 15.1 % (ref 14.0–49.7)
MCH: 33.8 pg (ref 25.1–34.0)
MCHC: 33.3 g/dL (ref 31.5–36.0)
MCV: 101.2 fL — ABNORMAL HIGH (ref 79.5–101.0)
MONO#: 0.1 10*3/uL (ref 0.1–0.9)
MONO%: 2.9 % (ref 0.0–14.0)
NEUT%: 81.5 % — ABNORMAL HIGH (ref 38.4–76.8)
NEUTROS ABS: 2.7 10*3/uL (ref 1.5–6.5)
Platelets: 155 10*3/uL (ref 145–400)
RBC: 2.63 10*6/uL — ABNORMAL LOW (ref 3.70–5.45)
RDW: 18.8 % — AB (ref 11.2–14.5)
WBC: 3.3 10*3/uL — AB (ref 3.9–10.3)
lymph#: 0.5 10*3/uL — ABNORMAL LOW (ref 0.9–3.3)

## 2014-12-20 LAB — COMPREHENSIVE METABOLIC PANEL (CC13)
ALK PHOS: 57 U/L (ref 40–150)
ALT: 24 U/L (ref 0–55)
ANION GAP: 9 meq/L (ref 3–11)
AST: 28 U/L (ref 5–34)
Albumin: 3.4 g/dL — ABNORMAL LOW (ref 3.5–5.0)
BILIRUBIN TOTAL: 0.56 mg/dL (ref 0.20–1.20)
BUN: 20.1 mg/dL (ref 7.0–26.0)
CO2: 26 meq/L (ref 22–29)
Calcium: 8 mg/dL — ABNORMAL LOW (ref 8.4–10.4)
Chloride: 106 mEq/L (ref 98–109)
Creatinine: 0.7 mg/dL (ref 0.6–1.1)
EGFR: >90 mL/min/{1.73_m2} (ref >90)
Glucose: 104 mg/dl (ref 70–140)
Potassium: 3.3 mEq/L — ABNORMAL LOW (ref 3.5–5.1)
Sodium: 141 mEq/L (ref 136–145)
Total Protein: 5.8 g/dL — ABNORMAL LOW (ref 6.4–8.3)

## 2014-12-20 MED ORDER — PEGFILGRASTIM INJECTION 6 MG/0.6ML ~~LOC~~
6.0000 mg | PREFILLED_SYRINGE | Freq: Once | SUBCUTANEOUS | Status: AC
  Administered 2014-12-20: 6 mg via SUBCUTANEOUS
  Filled 2014-12-20: qty 0.6

## 2014-12-20 NOTE — Progress Notes (Signed)
SYMPTOM MANAGEMENT CLINIC   HPI: Julie [name] 59 y.o. female diagnosed with breast cancer.  Patient is status post neoadjuvant Taxotere/carboplatin/Herceptin/Perjeta hemotherapy regimen completed on 12/17/2014.  Patient called the cancer Center today requesting urgent care visit.  She reports having several episodes of diarrhea since completing her last cycle of chemotherapy last week.  Patient reports that she started drinking hibiscus tea approximately one week ago; and has since noted a change in her stool color to a pink/red.  Patient states she has a known external tiny hemorrhoid; but denies any worsening pain or bleeding from her hemorrhoid.  Patient states that she has been able to eat and drink with no difficulty; and does not feel dehydrated today.  She also denies any recent fevers or chills.  Patient states that she is scheduled to receive IV fluid rehydration this Wednesday, 12/22/2014.   HPI   12/22/2014      0.9 %  sodium chloride infusion    ROS  Past Medical History  Diagnosis Date  . Uterine cyst   . Ovarian cyst   . Irregular menstrual cycle   . Hot flashes   . Breast cancer of upper-outer quadrant of left female breast   . Wears glasses   . PONV (postoperative nausea and vomiting)     severe    Past Surgical History  Procedure Laterality Date  . Uterine ablation  2006  . Cesarean section  1991  . Colonoscopy    . Diagnostic laparoscopy  1990    ovarian cysy  . Upper gi endoscopy    . Dilation and curettage of uterus    . Portacath placement Left 08/18/2014    Procedure: INSERTION PORT-A-CATH;  Surgeon: [name], MD;  Location: [facility];  Service: General;  Laterality: Left;    has Breast cancer of upper-outer quadrant of left female breast; Mucositis due to chemotherapy; Diarrhea; Vaginal yeast infection; Dehydration; Nausea without vomiting; Neutropenia; Syncope; Hypokalemia; and Antineoplastic chemotherapy induced anemia on  her problem list.    is allergic to codeine; sulfa antibiotics; and penicillins.    Medication List       This list is accurate as of: 12/20/14  5:00 PM.  Always use your most recent med list.               buPROPion 150 MG 12 hr tablet  Commonly known as:  WELLBUTRIN SR  Take 150 mg by mouth every evening.     cetirizine 10 MG tablet  Commonly known as:  ZYRTEC  Take 10 mg by mouth daily.     cholestyramine 4 G packet  Commonly known as:  QUESTRAN  Take 1 packet (4 g total) by mouth 3 (three) times daily with meals.     dexamethasone 4 MG tablet  Commonly known as:  DECADRON  Take 2 tablets (8 mg total) by mouth 2 (two) times daily. Start the day before Taxotere. Then again the day after chemo for 3 days.     esomeprazole 40 MG capsule  Commonly known as:  NEXIUM  Take 1 capsule (40 mg total) by mouth daily at 12 noon.     fluticasone 50 MCG/ACT nasal spray  Commonly known as:  FLONASE  Place into both nostrils as needed for allergies or rhinitis.     HYDROcodone-acetaminophen 5-325 MG per tablet  Commonly known as:  NORCO/VICODIN  Take 1-2 tablets by mouth every 4 (four) hours as needed for moderate pain or severe pain.  lidocaine-prilocaine cream  Commonly known as:  EMLA  Apply 1 application topically as needed.     lidocaine-prilocaine cream  Commonly known as:  EMLA  Apply 1 application topically as needed.     loperamide 2 MG capsule  Commonly known as:  IMODIUM  Take 1 capsule (2 mg total) by mouth as needed for diarrhea or loose stools.     LORazepam 0.5 MG tablet  Commonly known as:  ATIVAN  Take 1 tablet (0.5 mg total) by mouth every 6 (six) hours as needed (Nausea or vomiting).     magic mouthwash w/lidocaine Soln  Take 5 mLs by mouth 4 (four) times daily as needed for mouth pain.     ondansetron 8 MG tablet  Commonly known as:  ZOFRAN  Take 1 tablet (8 mg total) by mouth 2 (two) times daily. Start the day after chemo for 3 days. Then take  as needed for nausea or vomiting.     potassium chloride SA 20 MEQ tablet  Commonly known as:  K-DUR,KLOR-CON  Take 1 tablet (20 mEq total) by mouth daily.     prochlorperazine 10 MG tablet  Commonly known as:  COMPAZINE  TAKE 1 TABLET EVERY 6 HOURS AS NEEDED FOR NAUSEA & VOMITING.     simvastatin 20 MG tablet  Commonly known as:  ZOCOR  Take 20 mg by mouth daily at 6 PM.     sodium chloride 0.9 % infusion  Inject 1,000 mLs into the vein once.  Start taking on:  12/22/2014         PHYSICAL EXAMINATION  Blood pressure 122/77, pulse 71, temperature 96.7 F (35.9 C), temperature source Oral, resp. rate 16, weight 170 lb 3.2 oz (77.202 kg).  Physical Exam  Constitutional: She is oriented to person, place, and time. Vital signs are normal. She appears unhealthy.  HENT:  Head: Normocephalic and atraumatic.  Mouth/Throat: Oropharynx is clear and moist.  Eyes: Conjunctivae and EOM are normal. Pupils are equal, round, and reactive to light. Right eye exhibits no discharge. Left eye exhibits no discharge. No scleral icterus.  Neck: Normal range of motion. Neck supple. No JVD present. No tracheal deviation present. No thyromegaly present.  Cardiovascular: Normal rate, regular rhythm, normal heart sounds and intact distal pulses.   Pulmonary/Chest: Effort normal and breath sounds normal. No respiratory distress. She has no wheezes. She has no rales. She exhibits no tenderness.  Abdominal: Soft. Bowel sounds are normal. She exhibits no distension and no mass. There is no tenderness. There is no rebound and no guarding.  Musculoskeletal: Normal range of motion. She exhibits no edema or tenderness.  Lymphadenopathy:    She has no cervical adenopathy.  Neurological: She is alert and oriented to person, place, and time. Gait normal.  Skin: Skin is warm and dry. No rash noted. No erythema. There is pallor.  Psychiatric: Affect normal.  Nursing note and vitals reviewed.   LABORATORY  DATA:. Appointment on 12/20/2014  Component Date Value Ref Range Status  . WBC 12/20/2014 3.3* 3.9 - 10.3 10e3/uL Final  . NEUT# 12/20/2014 2.7  1.5 - 6.5 10e3/uL Final  . HGB 12/20/2014 8.9* 11.6 - 15.9 g/dL Final  . HCT 12/20/2014 26.6* 34.8 - 46.6 % Final  . Platelets 12/20/2014 155  145 - 400 10e3/uL Final  . MCV 12/20/2014 101.2* 79.5 - 101.0 fL Final  . MCH 12/20/2014 33.8  25.1 - 34.0 pg Final  . MCHC 12/20/2014 33.3  31.5 - 36.0 g/dL Final  .  RBC 12/20/2014 2.63* 3.70 - 5.45 10e6/uL Final  . RDW 12/20/2014 18.8* 11.2 - 14.5 % Final  . lymph# 12/20/2014 0.5* 0.9 - 3.3 10e3/uL Final  . MONO# 12/20/2014 0.1  0.1 - 0.9 10e3/uL Final  . Eosinophils Absolute 12/20/2014 0.0  0.0 - 0.5 10e3/uL Final  . Basophils Absolute 12/20/2014 0.0  0.0 - 0.1 10e3/uL Final  . NEUT% 12/20/2014 81.5* 38.4 - 76.8 % Final  . LYMPH% 12/20/2014 15.1  14.0 - 49.7 % Final  . MONO% 12/20/2014 2.9  0.0 - 14.0 % Final  . EOS% 12/20/2014 0.0  0.0 - 7.0 % Final  . BASO% 12/20/2014 0.5  0.0 - 2.0 % Final  . Sodium 12/20/2014 141  136 - 145 mEq/L Final  . Potassium 12/20/2014 3.3* 3.5 - 5.1 mEq/L Final  . Chloride 12/20/2014 106  98 - 109 mEq/L Final  . CO2 12/20/2014 26  22 - 29 mEq/L Final  . Glucose 12/20/2014 104  70 - 140 mg/dl Final  . BUN 12/20/2014 20.1  7.0 - 26.0 mg/dL Final  . Creatinine 12/20/2014 0.7  0.6 - 1.1 mg/dL Final  . Total Bilirubin 12/20/2014 0.56  0.20 - 1.20 mg/dL Final  . Alkaline Phosphatase 12/20/2014 57  40 - 150 U/L Final  . AST 12/20/2014 28  5 - 34 U/L Final  . ALT 12/20/2014 24  0 - 55 U/L Final  . Total Protein 12/20/2014 5.8* 6.4 - 8.3 g/dL Final  . Albumin 12/20/2014 3.4* 3.5 - 5.0 g/dL Final  . Calcium 12/20/2014 8.0* 8.4 - 10.4 mg/dL Final  . Anion Gap 12/20/2014 9  3 - 11 mEq/L Final  . EGFR 12/20/2014 >90  >90 ml/min/1.73 m2 Final   eGFR is calculated using the CKD-EPI Creatinine Equation (2009)     RADIOGRAPHIC STUDIES: No results found.  ASSESSMENT/PLAN:     Antineoplastic chemotherapy induced anemia Hemoglobin fairly stable at 8.9 today.  Patient denies any worsening issues with either fatigue or shortness of breath with exertion.  Will continue to monitor closely.   Breast cancer of upper-outer quadrant of left female breast Patient completed cycle 6 of her neoadjuvant Taxotere/carboplatin/Herceptin/Perjeta chemotherapy regimen on 12/17/2014.  Patient plans to return on Wednesday every 24th 2016 for IV fluid rehydration only.  She is scheduled for a restaging breast MRI on 12/27/2014.  She is scheduled to return for labs and a follow-up visit to review her scan results on 12/30/2014.   Diarrhea Patient has had several loose stools since her last chemotherapy last Friday, 12/17/2014.  Patient states that she also has a small external hemorrhoid as her baseline.  Patient states that she has noted some red/pink color to her stools since she started drinking hibiscus tea earlier this past week.  She denies any obvious blood or clots in her stool.  She denies any increased pain to her hemorrhoid site.  After further discussion-it is noted that hibiscus tea has a red/pink tent to it; and this is most likely the reason for the change in the stool color.  Also, both patient's hemoglobin and platelet count remained fairly stable.  However, did advised both patient and her husband to call/return to directly to the emergency department if she develops any worsening symptoms whatsoever.  Also, patient was advised to hold any further hibiscus tea to see if the stool returns to its normal color.   Hypokalemia Potassium slightly decreased at 3.3 today.  Patient states that she does have some potassium tablets at home that she could  take if needed.  Advised patient to take the potassium 20 mEq tablets for the next 2-3 days; and also try to increase her potassium-rich diet as well.  Will continue to monitor closely.   Patient stated understanding of all  instructions; and was in agreement with this plan of care. The patient knows to call the clinic with any problems, questions or concerns.   Review/collaboration with Dr. Lindi Adie regarding all aspects of patient's visit today.   Total time spent with patient was 25 minutes;  with greater than 75 percent of that time spent in face to face counseling regarding patient's symptoms,  and coordination of care and follow up.  Disclaimer: This note was dictated with voice recognition software. Similar sounding words can inadvertently be transcribed and may not be corrected upon review.   Drue Second, NP 12/20/2014

## 2014-12-20 NOTE — Assessment & Plan Note (Signed)
Hemoglobin fairly stable at 8.9 today.  Patient denies any worsening issues with either fatigue or shortness of breath with exertion.  Will continue to monitor closely.

## 2014-12-20 NOTE — Assessment & Plan Note (Signed)
Patient completed cycle 6 of her neoadjuvant Taxotere/carboplatin/Herceptin/Perjeta chemotherapy regimen on 12/17/2014.  Patient plans to return on Wednesday every 24th 2016 for IV fluid rehydration only.  She is scheduled for a restaging breast MRI on 12/27/2014.  She is scheduled to return for labs and a follow-up visit to review her scan results on 12/30/2014.

## 2014-12-20 NOTE — Telephone Encounter (Signed)
added appt per pof....pt is in lobby per pof

## 2014-12-20 NOTE — Assessment & Plan Note (Signed)
Potassium slightly decreased at 3.3 today.  Patient states that she does have some potassium tablets at home that she could take if needed.  Advised patient to take the potassium 20 mEq tablets for the next 2-3 days; and also try to increase her potassium-rich diet as well.  Will continue to monitor closely.

## 2014-12-20 NOTE — Telephone Encounter (Signed)
Pt received neulasta today. Filled walk in form for diarrhea with bloody stool

## 2014-12-20 NOTE — Assessment & Plan Note (Signed)
Patient has had several loose stools since her last chemotherapy last Friday, 12/17/2014.  Patient states that she also has a small external hemorrhoid as her baseline.  Patient states that she has noted some red/pink color to her stools since she started drinking hibiscus tea earlier this past week.  She denies any obvious blood or clots in her stool.  She denies any increased pain to her hemorrhoid site.  After further discussion-it is noted that hibiscus tea has a red/pink tent to it; and this is most likely the reason for the change in the stool color.  Also, both patient's hemoglobin and platelet count remained fairly stable.  However, did advised both patient and her husband to call/return to directly to the emergency department if she develops any worsening symptoms whatsoever.  Also, patient was advised to hold any further hibiscus tea to see if the stool returns to its normal color.

## 2014-12-21 ENCOUNTER — Telehealth: Payer: Self-pay

## 2014-12-21 NOTE — Telephone Encounter (Signed)
Pt not had bm yet. Her urine is less pink, she feels it probably was the hibiscus tea. "I am moving pretty slow but I will be in tomorrow for fluids". Instructed to call if any problems.

## 2014-12-22 ENCOUNTER — Ambulatory Visit (HOSPITAL_BASED_OUTPATIENT_CLINIC_OR_DEPARTMENT_OTHER): Payer: BLUE CROSS/BLUE SHIELD

## 2014-12-22 VITALS — BP 107/57 | HR 79 | Temp 98.2°F | Resp 24

## 2014-12-22 DIAGNOSIS — R197 Diarrhea, unspecified: Secondary | ICD-10-CM

## 2014-12-22 DIAGNOSIS — C50412 Malignant neoplasm of upper-outer quadrant of left female breast: Secondary | ICD-10-CM

## 2014-12-22 DIAGNOSIS — C50212 Malignant neoplasm of upper-inner quadrant of left female breast: Secondary | ICD-10-CM

## 2014-12-22 DIAGNOSIS — E876 Hypokalemia: Secondary | ICD-10-CM

## 2014-12-22 MED ORDER — HEPARIN SOD (PORK) LOCK FLUSH 100 UNIT/ML IV SOLN
500.0000 [IU] | INTRAVENOUS | Status: AC | PRN
  Administered 2014-12-22: 500 [IU]
  Filled 2014-12-22: qty 5

## 2014-12-22 MED ORDER — HEPARIN SOD (PORK) LOCK FLUSH 100 UNIT/ML IV SOLN
250.0000 [IU] | INTRAVENOUS | Status: DC | PRN
  Filled 2014-12-22: qty 5

## 2014-12-22 MED ORDER — SODIUM CHLORIDE 0.9 % IV SOLN
INTRAVENOUS | Status: DC
  Administered 2014-12-22: 09:00:00 via INTRAVENOUS

## 2014-12-22 MED ORDER — SODIUM CHLORIDE 0.9 % IJ SOLN
10.0000 mL | INTRAMUSCULAR | Status: AC | PRN
  Administered 2014-12-22: 10 mL
  Filled 2014-12-22: qty 10

## 2014-12-22 NOTE — Patient Instructions (Signed)
Dehydration, Adult Dehydration is when you lose more fluids from the body than you take in. Vital organs like the kidneys, brain, and heart cannot function without a proper amount of fluids and salt. Any loss of fluids from the body can cause dehydration.  CAUSES   Vomiting.  Diarrhea.  Excessive sweating.  Excessive urine output.  Fever. SYMPTOMS  Mild dehydration  Thirst.  Dry lips.  Slightly dry mouth. Moderate dehydration  Very dry mouth.  Sunken eyes.  Skin does not bounce back quickly when lightly pinched and released.  Dark urine and decreased urine production.  Decreased tear production.  Headache. Severe dehydration  Very dry mouth.  Extreme thirst.  Rapid, weak pulse (more than 100 beats per minute at rest).  Cold hands and feet.  Not able to sweat in spite of heat and temperature.  Rapid breathing.  Blue lips.  Confusion and lethargy.  Difficulty being awakened.  Minimal urine production.  No tears. DIAGNOSIS  Your caregiver will diagnose dehydration based on your symptoms and your exam. Blood and urine tests will help confirm the diagnosis. The diagnostic evaluation should also identify the cause of dehydration. TREATMENT  Treatment of mild or moderate dehydration can often be done at home by increasing the amount of fluids that you drink. It is best to drink small amounts of fluid more often. Drinking too much at one time can make vomiting worse. Refer to the home care instructions below. Severe dehydration needs to be treated at the hospital where you will probably be given intravenous (IV) fluids that contain water and electrolytes. HOME CARE INSTRUCTIONS   Ask your caregiver about specific rehydration instructions.  Drink enough fluids to keep your urine clear or pale yellow.  Drink small amounts frequently if you have nausea and vomiting.  Eat as you normally do.  Avoid:  Foods or drinks high in sugar.  Carbonated  drinks.  Juice.  Extremely hot or cold fluids.  Drinks with caffeine.  Fatty, greasy foods.  Alcohol.  Tobacco.  Overeating.  Gelatin desserts.  Wash your hands well to avoid spreading bacteria and viruses.  Only take over-the-counter or prescription medicines for pain, discomfort, or fever as directed by your caregiver.  Ask your caregiver if you should continue all prescribed and over-the-counter medicines.  Keep all follow-up appointments with your caregiver. SEEK MEDICAL CARE IF:  You have abdominal pain and it increases or stays in one area (localizes).  You have a rash, stiff neck, or severe headache.  You are irritable, sleepy, or difficult to awaken.  You are weak, dizzy, or extremely thirsty. SEEK IMMEDIATE MEDICAL CARE IF:   You are unable to keep fluids down or you get worse despite treatment.  You have frequent episodes of vomiting or diarrhea.  You have blood or green matter (bile) in your vomit.  You have blood in your stool or your stool looks black and tarry.  You have not urinated in 6 to 8 hours, or you have only urinated a small amount of very dark urine.  You have a fever.  You faint. MAKE SURE YOU:   Understand these instructions.  Will watch your condition.  Will get help right away if you are not doing well or get worse. Document Released: 10/15/2005 Document Revised: 01/07/2012 Document Reviewed: 06/04/2011 Exit[employer] Patient Information 2015 [employer]. This information is not intended to replace advice given to you by your health care provider. Make sure you discuss any questions you have with your health care   provider.  

## 2014-12-27 ENCOUNTER — Ambulatory Visit
Admission: RE | Admit: 2014-12-27 | Discharge: 2014-12-27 | Disposition: A | Discharge status: Home / Self Care | Payer: BLUE CROSS/BLUE SHIELD | Source: Ambulatory Visit | Attending: Hematology and Oncology | Admitting: Hematology and Oncology

## 2014-12-27 DIAGNOSIS — C50412 Malignant neoplasm of upper-outer quadrant of left female breast: Secondary | ICD-10-CM

## 2014-12-27 MED ORDER — GADOBENATE DIMEGLUMINE 529 MG/ML IV SOLN
16.0000 mL | Freq: Once | INTRAVENOUS | Status: AC | PRN
  Administered 2014-12-27: 16 mL via INTRAVENOUS

## 2014-12-28 ENCOUNTER — Encounter: Payer: Self-pay | Admitting: Hematology and Oncology

## 2014-12-28 NOTE — Progress Notes (Signed)
Enrolled pt in the Neulasta First Step program.  Faxed signed form and activated card today.  °

## 2014-12-29 ENCOUNTER — Other Ambulatory Visit: Payer: Self-pay

## 2014-12-29 ENCOUNTER — Encounter: Payer: Self-pay | Admitting: Genetic Counselor

## 2014-12-29 DIAGNOSIS — Z1379 Encounter for other screening for genetic and chromosomal anomalies: Secondary | ICD-10-CM | POA: Insufficient documentation

## 2014-12-29 DIAGNOSIS — C50412 Malignant neoplasm of upper-outer quadrant of left female breast: Secondary | ICD-10-CM

## 2014-12-30 ENCOUNTER — Telehealth: Payer: Self-pay

## 2014-12-30 ENCOUNTER — Other Ambulatory Visit (HOSPITAL_BASED_OUTPATIENT_CLINIC_OR_DEPARTMENT_OTHER): Payer: BLUE CROSS/BLUE SHIELD

## 2014-12-30 ENCOUNTER — Ambulatory Visit (HOSPITAL_BASED_OUTPATIENT_CLINIC_OR_DEPARTMENT_OTHER): Payer: BLUE CROSS/BLUE SHIELD | Admitting: Hematology and Oncology

## 2014-12-30 ENCOUNTER — Ambulatory Visit (HOSPITAL_BASED_OUTPATIENT_CLINIC_OR_DEPARTMENT_OTHER): Payer: BLUE CROSS/BLUE SHIELD

## 2014-12-30 ENCOUNTER — Telehealth: Payer: Self-pay | Admitting: Hematology and Oncology

## 2014-12-30 VITALS — BP 100/69 | HR 82 | Temp 97.5°F | Resp 18 | Ht 62.0 in | Wt 166.2 lb

## 2014-12-30 VITALS — BP 99/58 | HR 75 | Temp 98.0°F

## 2014-12-30 DIAGNOSIS — C50412 Malignant neoplasm of upper-outer quadrant of left female breast: Secondary | ICD-10-CM

## 2014-12-30 DIAGNOSIS — C50212 Malignant neoplasm of upper-inner quadrant of left female breast: Secondary | ICD-10-CM

## 2014-12-30 DIAGNOSIS — E876 Hypokalemia: Secondary | ICD-10-CM

## 2014-12-30 DIAGNOSIS — Z170 Estrogen receptor positive status [ER+]: Secondary | ICD-10-CM

## 2014-12-30 DIAGNOSIS — D6481 Anemia due to antineoplastic chemotherapy: Secondary | ICD-10-CM

## 2014-12-30 DIAGNOSIS — R530 Neoplastic (malignant) related fatigue: Secondary | ICD-10-CM

## 2014-12-30 DIAGNOSIS — R197 Diarrhea, unspecified: Secondary | ICD-10-CM

## 2014-12-30 DIAGNOSIS — E860 Dehydration: Secondary | ICD-10-CM

## 2014-12-30 LAB — COMPREHENSIVE METABOLIC PANEL (CC13)
ALK PHOS: 109 U/L (ref 40–150)
ALT: 20 U/L (ref 0–55)
AST: 21 U/L (ref 5–34)
Albumin: 3.5 g/dL (ref 3.5–5.0)
Anion Gap: 14 mEq/L — ABNORMAL HIGH (ref 3–11)
BILIRUBIN TOTAL: 0.43 mg/dL (ref 0.20–1.20)
BUN: 7.7 mg/dL (ref 7.0–26.0)
CHLORIDE: 107 meq/L (ref 98–109)
CO2: 25 meq/L (ref 22–29)
Calcium: 8.2 mg/dL — ABNORMAL LOW (ref 8.4–10.4)
Creatinine: 1 mg/dL (ref 0.6–1.1)
EGFR: 63 mL/min/{1.73_m2} — ABNORMAL LOW (ref >90)
Glucose: 96 mg/dl (ref 70–140)
Potassium: 3 mEq/L — CL (ref 3.5–5.1)
Sodium: 145 mEq/L (ref 136–145)
Total Protein: 6 g/dL — ABNORMAL LOW (ref 6.4–8.3)

## 2014-12-30 LAB — CBC WITH DIFFERENTIAL/PLATELET
BASO%: 0.2 % (ref 0.0–2.0)
Basophils Absolute: 0 10*3/uL (ref 0.0–0.1)
EOS ABS: 0 10*3/uL (ref 0.0–0.5)
EOS%: 0 % (ref 0.0–7.0)
HCT: 26.3 % — ABNORMAL LOW (ref 34.8–46.6)
HGB: 8.7 g/dL — ABNORMAL LOW (ref 11.6–15.9)
LYMPH%: 24.9 % (ref 14.0–49.7)
MCH: 34.1 pg — AB (ref 25.1–34.0)
MCHC: 33.1 g/dL (ref 31.5–36.0)
MCV: 103.1 fL — AB (ref 79.5–101.0)
MONO#: 0.5 10*3/uL (ref 0.1–0.9)
MONO%: 7.8 % (ref 0.0–14.0)
NEUT%: 67.1 % (ref 38.4–76.8)
NEUTROS ABS: 4.1 10*3/uL (ref 1.5–6.5)
PLATELETS: 49 10*3/uL — AB (ref 145–400)
RBC: 2.55 10*6/uL — ABNORMAL LOW (ref 3.70–5.45)
RDW: 17.1 % — ABNORMAL HIGH (ref 11.2–14.5)
WBC: 6.1 10*3/uL (ref 3.9–10.3)
lymph#: 1.5 10*3/uL (ref 0.9–3.3)

## 2014-12-30 MED ORDER — SODIUM CHLORIDE 0.9 % IJ SOLN
10.0000 mL | Freq: Once | INTRAMUSCULAR | Status: AC
  Administered 2014-12-30: 10 mL via INTRAVENOUS
  Filled 2014-12-30: qty 10

## 2014-12-30 MED ORDER — SODIUM CHLORIDE 0.9 % IV SOLN
Freq: Once | INTRAVENOUS | Status: DC
  Administered 2014-12-30: 10:00:00 via INTRAVENOUS
  Filled 2014-12-30: qty 500

## 2014-12-30 MED ORDER — HEPARIN SOD (PORK) LOCK FLUSH 100 UNIT/ML IV SOLN
500.0000 [IU] | Freq: Once | INTRAVENOUS | Status: AC
  Administered 2014-12-30: 500 [IU] via INTRAVENOUS
  Filled 2014-12-30: qty 5

## 2014-12-30 NOTE — Telephone Encounter (Signed)
MD notified  

## 2014-12-30 NOTE — Assessment & Plan Note (Signed)
Left breast invasive ductal carcinoma grade 3 ER 0% PR 0% HER-2 positive ratio 3.74, Ki-67 90%: 2.3 cm mass by ultrasound clinical stage: T2, N0, M0 stage II A.  Current treatment: Completed 6 cycles of neoadjuvant chemotherapy with Taxotere, carboplatin, Herceptin and Perjeta every 3 weeks. Will start Herceptin maintenance therapy 01/07/2015  Toxicities of chemotherapy: 1. Severe sore throat: previously treated with antibiotics, Nexium was added on 09/27/2014 and this led to resolution of symptoms 2. Alopecia 3. Fatigue: Chemotherapy dosage will be decreased from cycle 5 4. Mild fever related to sinus infection which has now resolved 5. Chemotherapy-induced anemia: Stable 6. Hypokalemia 7. Diarrhea related to Perjeta: Currently on Imodium and Lomotil. Also takes probiotics. She received IV fluids and hydration after 6 cycles of [place] Perjeta because of diarrhea and dehydration.  Radiology review: I discussed the findings of the breast MRI which show a remarkable improvement in the radiologic complete response to chemotherapy. She will now undergo surgery with lumpectomy. She will return back to see [place] every 3 week for Herceptin maintenance. I will see her back on one of her treatment days to discuss the final pathology report.

## 2014-12-30 NOTE — Telephone Encounter (Signed)
Called and lm with appts  anne

## 2014-12-30 NOTE — Patient Instructions (Signed)
Dehydration, Adult Dehydration is when you lose more fluids from the body than you take in. Vital organs like the kidneys, brain, and heart cannot function without a proper amount of fluids and salt. Any loss of fluids from the body can cause dehydration.  CAUSES   Vomiting.  Diarrhea.  Excessive sweating.  Excessive urine output.  Fever. SYMPTOMS  Mild dehydration  Thirst.  Dry lips.  Slightly dry mouth. Moderate dehydration  Very dry mouth.  Sunken eyes.  Skin does not bounce back quickly when lightly pinched and released.  Dark urine and decreased urine production.  Decreased tear production.  Headache. Severe dehydration  Very dry mouth.  Extreme thirst.  Rapid, weak pulse (more than 100 beats per minute at rest).  Cold hands and feet.  Not able to sweat in spite of heat and temperature.  Rapid breathing.  Blue lips.  Confusion and lethargy.  Difficulty being awakened.  Minimal urine production.  No tears. DIAGNOSIS  Your caregiver will diagnose dehydration based on your symptoms and your exam. Blood and urine tests will help confirm the diagnosis. The diagnostic evaluation should also identify the cause of dehydration. TREATMENT  Treatment of mild or moderate dehydration can often be done at home by increasing the amount of fluids that you drink. It is best to drink small amounts of fluid more often. Drinking too much at one time can make vomiting worse. Refer to the home care instructions below. Severe dehydration needs to be treated at the hospital where you will probably be given intravenous (IV) fluids that contain water and electrolytes. HOME CARE INSTRUCTIONS   Ask your caregiver about specific rehydration instructions.  Drink enough fluids to keep your urine clear or pale yellow.  Drink small amounts frequently if you have nausea and vomiting.  Eat as you normally do.  Avoid:  Foods or drinks high in sugar.  Carbonated  drinks.  Juice.  Extremely hot or cold fluids.  Drinks with caffeine.  Fatty, greasy foods.  Alcohol.  Tobacco.  Overeating.  Gelatin desserts.  Wash your hands well to avoid spreading bacteria and viruses.  Only take over-the-counter or prescription medicines for pain, discomfort, or fever as directed by your caregiver.  Ask your caregiver if you should continue all prescribed and over-the-counter medicines.  Keep all follow-up appointments with your caregiver. SEEK MEDICAL CARE IF:  You have abdominal pain and it increases or stays in one area (localizes).  You have a rash, stiff neck, or severe headache.  You are irritable, sleepy, or difficult to awaken.  You are weak, dizzy, or extremely thirsty. SEEK IMMEDIATE MEDICAL CARE IF:   You are unable to keep fluids down or you get worse despite treatment.  You have frequent episodes of vomiting or diarrhea.  You have blood or green matter (bile) in your vomit.  You have blood in your stool or your stool looks black and tarry.  You have not urinated in 6 to 8 hours, or you have only urinated a small amount of very dark urine.  You have a fever.  You faint. MAKE SURE YOU:   Understand these instructions.  Will watch your condition.  Will get help right away if you are not doing well or get worse. Document Released: 10/15/2005 Document Revised: 01/07/2012 Document Reviewed: 06/04/2011 Exit[employer] Patient Information 2015 [employer]. This information is not intended to replace advice given to you by your health care provider. Make sure you discuss any questions you have with your health care   provider.  

## 2014-12-30 NOTE — Progress Notes (Signed)
Patient Care Team: [name], MD as PCP - General (Internal Medicine)  DIAGNOSIS: Breast cancer of upper-outer quadrant of left female breast   Staging form: Breast, AJCC 7th Edition     Clinical: Stage IIA (T2, N0, cM0) - Unsigned       Staging comments: Staged at breast conference 08/11/14.      Pathologic: No stage assigned - Unsigned   SUMMARY OF ONCOLOGIC HISTORY:   Breast cancer of upper-outer quadrant of left female breast   08/05/2014 Mammogram Left breast 10 o clock: 2.3X 2X1.4 cm lobular hypoechoeic   08/05/2014 Initial Biopsy Grade 3 IDC Er 0%, PR 0%, Her 2 positive Ratio 3.74; Ki 67: 90%   08/17/2014 Breast MRI Left breast known malignancy upper inner quadrant 2.6 cm: Suspicious non-mass enhancement upper-inner quadrant right breast biopsy pending   08/27/2014 - 12/17/2014 Chemotherapy Neoadjuvant chemotherapy with Taxotere, carboplatin, Herceptin, Perjeta every 3 weeks x6 cycles of Herceptin maintenance for one year.   12/27/2014 Breast MRI Significant treatment response. Previously biopsied mass is no longer visible, residual clumped linearly oriented non-mass enhancement anterior to the biopsied mass.    CHIEF COMPLIANT: Follow-up to discuss MRI results. Complains of neuropathy, diarrhea, fatigue  INTERVAL HISTORY: Julie [name] is a 59 year old with above-mentioned history of left breast cancer currently treated with neoadjuvant chemotherapy. With the last cycle of chemotherapy she felt really ill, had diarrhea nausea and vomiting. She was hypokalemic and was put on oral potassium supplementation. Today she comes in to review the MRI of her breasts. Complains of mild neuropathy in her fingers and toes.  REVIEW OF SYSTEMS:   Constitutional: Denies fevers, chills or abnormal weight loss Eyes: Denies blurriness of vision Ears, nose, mouth, throat, and face: Denies mucositis or sore throat Respiratory: Denies cough, dyspnea or wheezes Cardiovascular: Denies palpitation,  chest discomfort or lower extremity swelling Gastrointestinal:  Denies nausea, heartburn or change in bowel habits Skin: Denies abnormal skin rashes Lymphatics: Denies new lymphadenopathy or easy bruising Neurological: Mild tingling and numbness of fingers and toes  Behavioral/Psych: Mood is stable, no new changes  Breast: Much improvement in the breast lump All other systems were reviewed with the patient and are negative.  I have reviewed the past medical history, past surgical history, social history and family history with the patient and they are unchanged from previous note.  ALLERGIES:  is allergic to codeine; sulfa antibiotics; and penicillins.  MEDICATIONS:  Current Outpatient Prescriptions  Medication Sig Dispense Refill  . Alum & Mag Hydroxide-Simeth (MAGIC MOUTHWASH W/LIDOCAINE) SOLN Take 5 mLs by mouth 4 (four) times daily as needed for mouth pain. 240 mL 0  . buPROPion (WELLBUTRIN SR) 150 MG 12 hr tablet Take 150 mg by mouth every evening.     . cetirizine (ZYRTEC) 10 MG tablet Take 10 mg by mouth daily.    . cholestyramine (QUESTRAN) 4 G packet Take 1 packet (4 g total) by mouth 3 (three) times daily with meals. 60 each 3  . dexamethasone (DECADRON) 4 MG tablet Take 2 tablets (8 mg total) by mouth 2 (two) times daily. Start the day before Taxotere. Then again the day after chemo for 3 days. 30 tablet 1  . esomeprazole (NEXIUM) 40 MG capsule Take 1 capsule (40 mg total) by mouth daily at 12 noon. 30 capsule 3  . fluticasone (FLONASE) 50 MCG/ACT nasal spray Place into both nostrils as needed for allergies or rhinitis.    Marland Kitchen HYDROcodone-acetaminophen (NORCO/VICODIN) 5-325 MG per tablet Take 1-2 tablets  by mouth every 4 (four) hours as needed for moderate pain or severe pain. 20 tablet 0  . lidocaine-prilocaine (EMLA) cream Apply 1 application topically as needed. 30 g 6  . loperamide (IMODIUM) 2 MG capsule Take 1 capsule (2 mg total) by mouth as needed for diarrhea or loose  stools. 30 capsule 2  . LORazepam (ATIVAN) 0.5 MG tablet Take 1 tablet (0.5 mg total) by mouth every 6 (six) hours as needed (Nausea or vomiting). 30 tablet 0  . ondansetron (ZOFRAN) 8 MG tablet Take 1 tablet (8 mg total) by mouth 2 (two) times daily. Start the day after chemo for 3 days. Then take as needed for nausea or vomiting. 30 tablet 1  . potassium chloride SA (K-DUR,KLOR-CON) 20 MEQ tablet Take 1 tablet (20 mEq total) by mouth daily. 30 tablet 0  . prochlorperazine (COMPAZINE) 10 MG tablet TAKE 1 TABLET EVERY 6 HOURS AS NEEDED FOR NAUSEA & VOMITING. 30 tablet 0  . simvastatin (ZOCOR) 20 MG tablet Take 20 mg by mouth daily at 6 PM.      No current facility-administered medications for this visit.   Facility-Administered Medications Ordered in Other Visits  Medication Dose Route Frequency Provider Last Rate Last Dose  . Influenza vac split quadrivalent PF (FLUARIX) injection 0.5 mL  0.5 mL Intramuscular Tomorrow-1000 Rulon Eisenmenger, MD        PHYSICAL EXAMINATION: ECOG PERFORMANCE STATUS: 1 - Symptomatic but completely ambulatory  Filed Vitals:   12/30/14 0856  BP: 100/69  Pulse: 82  Temp: 97.5 F (36.4 C)  Resp: 18   Filed Weights   12/30/14 0856  Weight: 166 lb 3.2 oz (75.388 kg)    GENERAL:alert, no distress and comfortable SKIN: skin color, texture, turgor are normal, no rashes or significant lesions EYES: normal, Conjunctiva are pink and non-injected, sclera clear OROPHARYNX:no exudate, no erythema and lips, buccal mucosa, and tongue normal  NECK: supple, thyroid normal size, non-tender, without nodularity LYMPH:  no palpable lymphadenopathy in the cervical, axillary or inguinal LUNGS: clear to auscultation and percussion with normal breathing effort HEART: regular rate & rhythm and no murmurs and no lower extremity edema ABDOMEN:abdomen soft, non-tender and normal bowel sounds Musculoskeletal:no cyanosis of digits and no clubbing  NEURO: alert & oriented x 3 with  fluent speech, no focal motor/sensory deficits   LABORATORY DATA:  I have reviewed the data as listed   Chemistry      Component Value Date/Time   NA 145 12/30/2014 0838   K 3.0* 12/30/2014 0838   CO2 25 12/30/2014 0838   BUN 7.7 12/30/2014 0838   CREATININE 1.0 12/30/2014 0838      Component Value Date/Time   CALCIUM 8.2* 12/30/2014 0838   ALKPHOS 109 12/30/2014 0838   AST 21 12/30/2014 0838   ALT 20 12/30/2014 0838   BILITOT 0.43 12/30/2014 0838       Lab Results  Component Value Date   WBC 6.1 12/30/2014   HGB 8.7* 12/30/2014   HCT 26.3* 12/30/2014   MCV 103.1* 12/30/2014   PLT 49* 12/30/2014   NEUTROABS 4.1 12/30/2014     RADIOGRAPHIC STUDIES: I have personally reviewed the radiology reports and agreed with their findings. MRI breasts is summarized as before  ASSESSMENT & PLAN:  Breast cancer of upper-outer quadrant of left female breast Left breast invasive ductal carcinoma grade 3 ER 0% PR 0% HER-2 positive ratio 3.74, Ki-67 90%: 2.3 cm mass by ultrasound clinical stage: T2, N0, M0 stage II A.  Current treatment: Completed 6 cycles of neoadjuvant chemotherapy with Taxotere, carboplatin, Herceptin and Perjeta every 3 weeks. Will start Herceptin maintenance therapy 01/07/2015  Toxicities of chemotherapy: 1. Severe sore throat: previously treated with antibiotics, Nexium was added on 09/27/2014 and this led to resolution of symptoms 2. Alopecia 3. Fatigue: Chemotherapy dosage will be decreased from cycle 5 4. Mild fever related to sinus infection which has now resolved 5. Chemotherapy-induced anemia: Stable 6. Hypokalemia: Replace with oral potassium and now giving IV potassium 12/30/2014 7. Diarrhea related to Perjeta: Currently on Imodium and Lomotil. Also takes probiotics. She received IV fluids and hydration after 6 cycles of [place] Perjeta because of diarrhea and dehydration.  Radiology review: I discussed the findings of the breast MRI which show a  remarkable improvement in the radiologic complete response to chemotherapy. She will now undergo surgery with lumpectomy. She will return back to see [place] every 3 week for Herceptin maintenance. I will see her back on one of her treatment days to discuss the final pathology report. Return to clinic April 1 for follow-up with her maintenance Herceptin   No orders of the defined types were placed in this encounter.   The patient has a good understanding of the overall plan. she agrees with it. She will call with any problems that may develop before her next visit here.   Rulon Eisenmenger, MD

## 2015-01-03 ENCOUNTER — Other Ambulatory Visit (INDEPENDENT_AMBULATORY_CARE_PROVIDER_SITE_OTHER): Payer: Self-pay | Admitting: General Surgery

## 2015-01-03 DIAGNOSIS — C50212 Malignant neoplasm of upper-inner quadrant of left female breast: Secondary | ICD-10-CM

## 2015-01-03 NOTE — H&P (Signed)
Julie [name] 01/03/2015 9:53 AM Location: [facility] Patient #: [id number] DOB: [birth year] Married / Language: [name] / Race: White Female  History of Present Illness [name] MD; 01/03/2015 10:37 AM) Patient words: breast cancer follow up.  The patient is a 59 year old female who presents with breast cancer. Previous history [The patient is being seen for a consultation for Stage II ( T2 and N0 ) invasive ductal carcinoma (Grade 3, Ki67 90%) of the left breast (upper inner quadrant). Tumor markers include estrogen receptor negative, progesterone receptor negative and HER -2/neu positive. The patient was referred by a primary care provider (Dr. Maudry Mayhew). Initial presentation was for breast mass on medical examination (Mass was detected on screening mammo, but it was present at exam for diagnostic mammogram.) and an abnormal mammogram. Current diagnosis was determined by mammography (2.3 x 2.0 x 1.4 cm), left breast ultrasound and core needle biopsy. Symptoms include skin changes in the involved breast (bruising), while symptoms do not include breast pain, nipple discharge, fatigue, nausea or vomiting. Risk factors include breast cancer in a second degree relative. She is postmenopausal.] The patient had additional biopsies that were negative for bilateral breast cancer. She has been undergoing chemotherapy including Herceptin. She had her last dose of chemotherapy on February 19. She will continue receiving the Herceptin for the remainder of the year. She had some diarrhea, fatigue, and loss of taste. The last chemotherapy treatment was more difficult to get through than the previous ones.   Other Problems Elbert Ewings, CMA; 01/03/2015 9:53 AM) Breast Cancer Gastroesophageal Reflux Disease Hemorrhoids High blood pressure Hypercholesterolemia Lump In Breast  Past Surgical History Elbert Ewings, CMA; 01/03/2015 9:53 AM) Breast Biopsy Bilateral. Cesarean Section -  1 Oral Surgery  Diagnostic Studies History Elbert Ewings, CMA; 01/03/2015 9:53 AM) Colonoscopy 5-10 years ago 1-5 years ago Mammogram within last year Pap Smear 1-5 years ago  Allergies Elbert Ewings, CMA; 01/03/2015 9:54 AM) Codeine Sulfate *ANALGESICS - OPIOID* Sulfa 10 *OPHTHALMIC AGENTS* Penicillin G Procaine *PENICILLINS*  Medication History Elbert Ewings, CMA; 01/03/2015 9:58 AM) Alum & Mag Hydroxide-Simeth (200-200-20MG Capsule, Oral) Active. Wellbutrin SR (150MG Tablet ER 12HR, Oral) Active. ZyrTEC Allergy (10MG Tablet, Oral as needed) Active. Questran (4GM Packet, Oral) Active. Decadron (4MG Tablet, Oral) Active. NexIUM (40MG Capsule DR, Oral) Active. Flonase (50MCG/ACT Suspension, Nasal) Active. Norco (5-325MG Tablet, Oral) Active. EMLA (2.5-2.5% Cream, External) Active. Imodium Advanced (2-125MG Tablet Chewable, Oral) Active. Ativan (0.5MG Tablet, Oral) Active. Zofran (8MG Tablet, Oral) Active. Potassium Bicarbonate (20MEQ Tablet Effer, Oral) Active. Compazine (10MG Tablet, Oral) Active. Zocor (20MG Tablet, Oral) Active. Medications Reconciled  Social History Elbert Ewings, [place]; 01/03/2015 9:53 AM) Alcohol use Occasional alcohol use. Illicit drug use Remotely quit drug use. No caffeine use Tobacco use Never smoker.  Family History Elbert Ewings, [place]; 01/03/2015 9:53 AM) Heart Disease Mother. Hypertension Father, Mother. Prostate Cancer Father.  Pregnancy / Birth History Elbert Ewings, CMA; 01/03/2015 9:53 AM) Age at menarche 60 years. Age of menopause 24-60 <45 Contraceptive History Oral contraceptives. Gravida 3 Irregular periods Maternal age 86-20 Para 2  Review of Systems Elbert Ewings CMA; 01/03/2015 9:53 AM) General Present- Appetite Loss, Fatigue and Weight Loss. Not Present- Chills, Fever, Night Sweats and Weight Gain. Skin Not Present- Change in Wart/Mole, Dryness, Hives, Jaundice, New Lesions, Non-Healing Wounds, Rash and  Ulcer. HEENT Present- Seasonal Allergies and Wears glasses/contact lenses. Not Present- Earache, Hearing Loss, Hoarseness, Nose Bleed, Oral Ulcers, Ringing in the Ears, Sinus Pain, Sore Throat, Visual Disturbances and Yellow  Eyes. Respiratory Not Present- Bloody sputum, Chronic Cough, Difficulty Breathing, Snoring and Wheezing. Breast Not Present- Breast Mass, Breast Pain, Nipple Discharge and Skin Changes. Cardiovascular Not Present- Chest Pain, Difficulty Breathing Lying Down, Leg Cramps, Palpitations, Rapid Heart Rate, Shortness of Breath and Swelling of Extremities. Gastrointestinal Present- Hemorrhoids and Rectal Pain. Not Present- Abdominal Pain, Bloating, Bloody Stool, Change in Bowel Habits, Chronic diarrhea, Constipation, Difficulty Swallowing, Excessive gas, Gets full quickly at meals, Indigestion, Nausea and Vomiting. Female Genitourinary Not Present- Frequency, Nocturia, Painful Urination, Pelvic Pain and Urgency. Musculoskeletal Present- Muscle Weakness. Not Present- Back Pain, Joint Pain, Joint Stiffness, Muscle Pain and Swelling of Extremities. Neurological Present- Tingling and Weakness. Not Present- Decreased Memory, Fainting, Headaches, Numbness, Seizures, Tremor and Trouble walking. Psychiatric Not Present- Anxiety, Bipolar, Change in Sleep Pattern, Depression, Fearful and Frequent crying. Endocrine Not Present- Cold Intolerance, Excessive Hunger, Hair Changes, Heat Intolerance, Hot flashes and New Diabetes. Hematology Not Present- Easy Bruising, Excessive bleeding, Gland problems, HIV and Persistent Infections.   Vitals Elbert Ewings CMA; 01/03/2015 9:58 AM) 01/03/2015 9:58 AM Weight: 169 lb Height: 62in Body Surface Area: 1.83 m Body Mass Index: 30.91 kg/m Temp.: 97.37F(Temporal)  Pulse: 98 (Regular)  BP: 130/78 (Sitting, Left Arm, Standard)    Physical Exam [name] MD; 01/03/2015 10:38 AM) General Mental Status-Alert. General Appearance-Consistent  with stated age. Hydration-Well hydrated. Voice-Normal.  Head and Neck Head-normocephalic, atraumatic with no lesions or palpable masses.  Eye Sclera/Conjunctiva - Bilateral-No scleral icterus.  Chest and Lung Exam Chest and lung exam reveals -quiet, even and easy respiratory effort with no use of accessory muscles. Inspection Chest Wall - Normal. Back - normal.  Breast Note: No palpable masses or lymphadenopathy. Scab at the location where her breast biopsies were on both breasts.   Cardiovascular Cardiovascular examination reveals -normal pedal pulses bilaterally. Note: regular rate and rhythm  Abdomen Inspection-Inspection Normal. Palpation/Percussion Palpation and Percussion of the abdomen reveal - Soft, Non Tender, No Rebound tenderness, No Rigidity (guarding) and No hepatosplenomegaly.  Peripheral Vascular Upper Extremity Inspection - Bilateral - Normal - No Clubbing, No Cyanosis, No Edema, Pulses Intact. Lower Extremity Palpation - Edema - Bilateral - No edema.  Neurologic Neurologic evaluation reveals -alert and oriented x 3 with no impairment of recent or remote memory. Mental Status-Normal.  Musculoskeletal Global Assessment -Note: no gross deformities.  Normal Exam - Left-Upper Extremity Strength Normal and Lower Extremity Strength Normal. Normal Exam - Right-Upper Extremity Strength Normal and Lower Extremity Strength Normal.  Lymphatic Head & Neck  General Head & Neck Lymphatics: Bilateral - Description - Normal. Axillary  General Axillary Region: Bilateral - Description - Normal. Tenderness - Non Tender.    Assessment & Plan [name] MD; 01/03/2015 10:35 AM) CARCINOMA OF UPPER-INNER QUADRANT OF LEFT FEMALE BREAST (174.2  C50.212) Impression: We will plan a seed localized left lumpectomy wtih sentinel lymph node biopsy.  I discussed the risks of surgery including bleeding, infection, damage to adjacent structures,  pain, seroma, possible need for additional surgery or procedures, possibility of recurrent cancer.  Improving and she will end up having a pathologic complete response since she appears to have a dramatic radiographic response.  Her platelets will hopefully have recovered by 3.22 for surgery. we will check them the week before.  30 min spent in counseling. Current Plans  Schedule for Surgery Pt Education - flb breast cancer surgery: discussed with patient and provided information.   Signed by [name], MD (01/03/2015 10:39 AM)  Dr. Delfina Redwood, PCP Dr. Lindi Adie, Med  onc Dr. Pablo Ledger, rad onc.

## 2015-01-04 ENCOUNTER — Other Ambulatory Visit (INDEPENDENT_AMBULATORY_CARE_PROVIDER_SITE_OTHER): Payer: Self-pay | Admitting: General Surgery

## 2015-01-04 DIAGNOSIS — C50212 Malignant neoplasm of upper-inner quadrant of left female breast: Secondary | ICD-10-CM

## 2015-01-05 ENCOUNTER — Other Ambulatory Visit: Payer: Self-pay

## 2015-01-07 ENCOUNTER — Ambulatory Visit (HOSPITAL_BASED_OUTPATIENT_CLINIC_OR_DEPARTMENT_OTHER): Payer: BLUE CROSS/BLUE SHIELD

## 2015-01-07 ENCOUNTER — Other Ambulatory Visit (HOSPITAL_BASED_OUTPATIENT_CLINIC_OR_DEPARTMENT_OTHER): Payer: BLUE CROSS/BLUE SHIELD

## 2015-01-07 ENCOUNTER — Ambulatory Visit (HOSPITAL_BASED_OUTPATIENT_CLINIC_OR_DEPARTMENT_OTHER): Payer: BLUE CROSS/BLUE SHIELD | Admitting: Nurse Practitioner

## 2015-01-07 ENCOUNTER — Other Ambulatory Visit: Payer: Self-pay | Admitting: Hematology and Oncology

## 2015-01-07 VITALS — BP 108/63 | HR 86 | Temp 98.4°F

## 2015-01-07 DIAGNOSIS — C50412 Malignant neoplasm of upper-outer quadrant of left female breast: Secondary | ICD-10-CM

## 2015-01-07 DIAGNOSIS — D696 Thrombocytopenia, unspecified: Secondary | ICD-10-CM

## 2015-01-07 DIAGNOSIS — Z5111 Encounter for antineoplastic chemotherapy: Secondary | ICD-10-CM

## 2015-01-07 DIAGNOSIS — C50212 Malignant neoplasm of upper-inner quadrant of left female breast: Secondary | ICD-10-CM

## 2015-01-07 DIAGNOSIS — E876 Hypokalemia: Secondary | ICD-10-CM

## 2015-01-07 DIAGNOSIS — E860 Dehydration: Secondary | ICD-10-CM

## 2015-01-07 DIAGNOSIS — D6481 Anemia due to antineoplastic chemotherapy: Secondary | ICD-10-CM

## 2015-01-07 DIAGNOSIS — T451X5A Adverse effect of antineoplastic and immunosuppressive drugs, initial encounter: Secondary | ICD-10-CM

## 2015-01-07 LAB — CBC WITH DIFFERENTIAL/PLATELET
BASO%: 0 % (ref 0.0–2.0)
BASOS ABS: 0 10*3/uL (ref 0.0–0.1)
EOS%: 0 % (ref 0.0–7.0)
Eosinophils Absolute: 0 10*3/uL (ref 0.0–0.5)
HCT: 23.3 % — ABNORMAL LOW (ref 34.8–46.6)
HGB: 7.5 g/dL — ABNORMAL LOW (ref 11.6–15.9)
LYMPH%: 27.3 % (ref 14.0–49.7)
MCH: 34.2 pg — ABNORMAL HIGH (ref 25.1–34.0)
MCHC: 32.2 g/dL (ref 31.5–36.0)
MCV: 106.4 fL — ABNORMAL HIGH (ref 79.5–101.0)
MONO#: 0.5 10*3/uL (ref 0.1–0.9)
MONO%: 10.9 % (ref 0.0–14.0)
NEUT%: 61.8 % (ref 38.4–76.8)
NEUTROS ABS: 3 10*3/uL (ref 1.5–6.5)
NRBC: 0 % (ref 0–0)
PLATELETS: 89 10*3/uL — AB (ref 145–400)
RBC: 2.19 10*6/uL — ABNORMAL LOW (ref 3.70–5.45)
RDW: 19.2 % — ABNORMAL HIGH (ref 11.2–14.5)
WBC: 4.9 10*3/uL (ref 3.9–10.3)
lymph#: 1.3 10*3/uL (ref 0.9–3.3)

## 2015-01-07 LAB — COMPREHENSIVE METABOLIC PANEL (CC13)
ALT: 15 U/L (ref 0–55)
ANION GAP: 13 meq/L — AB (ref 3–11)
AST: 17 U/L (ref 5–34)
Albumin: 3 g/dL — ABNORMAL LOW (ref 3.5–5.0)
Alkaline Phosphatase: 76 U/L (ref 40–150)
BUN: 5.6 mg/dL — ABNORMAL LOW (ref 7.0–26.0)
CHLORIDE: 107 meq/L (ref 98–109)
CO2: 24 meq/L (ref 22–29)
Calcium: 6.9 mg/dL — ABNORMAL LOW (ref 8.4–10.4)
Creatinine: 0.8 mg/dL (ref 0.6–1.1)
EGFR: 80 mL/min/{1.73_m2} — AB (ref >90)
GLUCOSE: 99 mg/dL (ref 70–140)
POTASSIUM: 3.4 meq/L — AB (ref 3.5–5.1)
Sodium: 145 mEq/L (ref 136–145)
TOTAL PROTEIN: 5.2 g/dL — AB (ref 6.4–8.3)
Total Bilirubin: 0.22 mg/dL (ref 0.20–1.20)

## 2015-01-07 MED ORDER — SODIUM CHLORIDE 0.9 % IJ SOLN
10.0000 mL | INTRAMUSCULAR | Status: DC | PRN
  Administered 2015-01-07: 10 mL
  Filled 2015-01-07: qty 10

## 2015-01-07 MED ORDER — SODIUM CHLORIDE 0.9 % IV SOLN: Freq: Once | INTRAVENOUS | Status: DC

## 2015-01-07 MED ORDER — HEPARIN SOD (PORK) LOCK FLUSH 100 UNIT/ML IV SOLN
500.0000 [IU] | Freq: Once | INTRAVENOUS | Status: AC | PRN
  Administered 2015-01-07: 500 [IU]
  Filled 2015-01-07: qty 5

## 2015-01-07 MED ORDER — DIPHENHYDRAMINE HCL 25 MG PO CAPS
ORAL_CAPSULE | ORAL | Status: AC
  Filled 2015-01-07: qty 2

## 2015-01-07 MED ORDER — ACETAMINOPHEN 325 MG PO TABS
ORAL_TABLET | ORAL | Status: AC
  Filled 2015-01-07: qty 2

## 2015-01-07 MED ORDER — DIPHENHYDRAMINE HCL 25 MG PO CAPS
50.0000 mg | ORAL_CAPSULE | Freq: Once | ORAL | Status: AC
  Administered 2015-01-07: 50 mg via ORAL

## 2015-01-07 MED ORDER — ONDANSETRON HCL 40 MG/20ML IJ SOLN
Freq: Once | INTRAMUSCULAR | Status: AC
  Administered 2015-01-07: 09:00:00 via INTRAVENOUS
  Filled 2015-01-07: qty 4

## 2015-01-07 MED ORDER — SODIUM CHLORIDE 0.9 % IV SOLN
INTRAVENOUS | Status: AC
  Administered 2015-01-07: 09:00:00 via INTRAVENOUS

## 2015-01-07 MED ORDER — TRASTUZUMAB CHEMO INJECTION 440 MG
6.0000 mg/kg | Freq: Once | INTRAVENOUS | Status: AC
  Administered 2015-01-07: 462 mg via INTRAVENOUS
  Filled 2015-01-07: qty 22

## 2015-01-07 MED ORDER — ACETAMINOPHEN 325 MG PO TABS
650.0000 mg | ORAL_TABLET | Freq: Once | ORAL | Status: AC
  Administered 2015-01-07: 650 mg via ORAL

## 2015-01-07 NOTE — Patient Instructions (Signed)
[facility] Discharge Instructions for Patients Receiving Chemotherapy  Today you received the following chemotherapy agents herceptin   To help prevent nausea and vomiting after your treatment, we encourage you to take your nausea medication as directed   If you develop nausea and vomiting that is not controlled by your nausea medication, call the clinic.   BELOW ARE SYMPTOMS THAT SHOULD BE REPORTED IMMEDIATELY:  *FEVER GREATER THAN 100.5 F  *CHILLS WITH OR WITHOUT FEVER  NAUSEA AND VOMITING THAT IS NOT CONTROLLED WITH YOUR NAUSEA MEDICATION  *UNUSUAL SHORTNESS OF BREATH  *UNUSUAL BRUISING OR BLEEDING  TENDERNESS IN MOUTH AND THROAT WITH OR WITHOUT PRESENCE OF ULCERS  *URINARY PROBLEMS  *BOWEL PROBLEMS  UNUSUAL RASH Items with * indicate a potential emergency and should be followed up as soon as possible.  Feel free to call the clinic you have any questions or concerns. The clinic phone number is [phone].  

## 2015-01-08 ENCOUNTER — Encounter: Payer: Self-pay | Admitting: Nurse Practitioner

## 2015-01-08 DIAGNOSIS — D696 Thrombocytopenia, unspecified: Secondary | ICD-10-CM | POA: Insufficient documentation

## 2015-01-08 NOTE — Assessment & Plan Note (Signed)
Pt completed neoadjuvant taxotere/carboplatin/herceptin/perjeta chemo regimen on 12/17/14.  She is scheduled for seed localized left lumpectomy with sentinel node bx on 01/17/15 per Dr. Barry Dienes.   She received her Herceptin infusion today; and will return 01/25/15 for her next herceptin infusion.

## 2015-01-08 NOTE — Assessment & Plan Note (Signed)
Platelet count currently 89.  Most likely secondary to previus chemo. Pt denies any worsening issues with either easy bleeding or bruising.

## 2015-01-08 NOTE — Assessment & Plan Note (Signed)
Potassium down to 3.4 today.  Pt advised to push potassium in her diet. Will continue to monitor.

## 2015-01-08 NOTE — Assessment & Plan Note (Signed)
Pt c/o mild dizziness with position changes. Pt with BP slightly lower to 87/64.  Pt does feel dehydrated today.  Pt received 1 liter NS IV fluid rehydration today. BP improved to 108/63.    Pt advised to change positions slower to prevent any falls secondary to mild orthostatic hypotension. Pt was also encouraged to push fluids.

## 2015-01-08 NOTE — Progress Notes (Signed)
SYMPTOM MANAGEMENT CLINIC   HPI: Julie [name] 59 y.o. female diagnosed with breast cancer.  Pt is s/p neoadjuvant taxotere/carboplatin/herceptin/perjeta chemotherapy regimen. Continues with Herceptin infusions on an every 3 week basis. Plan is for pt to undergo seed localized lumpectomy with sentinel node bx on 01/17/15.    Pt presented to the cancer center for her herceptin infusion today; and c/o occ dizziness with sudden position changes.  She denies any increased fatigue or SOB.  She also denies any active bleeding. She denies any fever or chills. She does feel slightly dehydrated today.   HPI  ROS  Past Medical History  Diagnosis Date  . Uterine cyst   . Ovarian cyst   . Irregular menstrual cycle   . Hot flashes   . Breast cancer of upper-outer quadrant of left female breast   . Wears glasses   . PONV (postoperative nausea and vomiting)     severe    Past Surgical History  Procedure Laterality Date  . Uterine ablation  2006  . Cesarean section  1991  . Colonoscopy    . Diagnostic laparoscopy  1990    ovarian cysy  . Upper gi endoscopy    . Dilation and curettage of uterus    . Portacath placement Left 08/18/2014    Procedure: INSERTION PORT-A-CATH;  Surgeon: [name], MD;  Location: [facility];  Service: General;  Laterality: Left;    has Breast cancer of upper-outer quadrant of left female breast; Mucositis due to chemotherapy; Diarrhea; Vaginal yeast infection; Dehydration; Nausea without vomiting; Neutropenia; Syncope; Hypokalemia; Antineoplastic chemotherapy induced anemia; Genetic testing; and Thrombocytopenia on her problem list.    is allergic to codeine; sulfa antibiotics; and penicillins.    Medication List       This list is accurate as of: 01/07/15 11:59 PM.  Always use your most recent med list.               buPROPion 150 MG 12 hr tablet  Commonly known as:  WELLBUTRIN SR  Take 150 mg by mouth every evening.     cetirizine 10 MG tablet  Commonly known as:  ZYRTEC  Take 10 mg by mouth daily.     cholestyramine 4 G packet  Commonly known as:  QUESTRAN  Take 1 packet (4 g total) by mouth 3 (three) times daily with meals.     esomeprazole 40 MG capsule  Commonly known as:  NEXIUM  Take 1 capsule (40 mg total) by mouth daily at 12 noon.     fluticasone 50 MCG/ACT nasal spray  Commonly known as:  FLONASE  Place into both nostrils as needed for allergies or rhinitis.     HYDROcodone-acetaminophen 5-325 MG per tablet  Commonly known as:  NORCO/VICODIN  Take 1-2 tablets by mouth every 4 (four) hours as needed for moderate pain or severe pain.     loperamide 2 MG capsule  Commonly known as:  IMODIUM  Take 1 capsule (2 mg total) by mouth as needed for diarrhea or loose stools.     LORazepam 0.5 MG tablet  Commonly known as:  ATIVAN  Take 1 tablet (0.5 mg total) by mouth every 6 (six) hours as needed (Nausea or vomiting).     magic mouthwash w/lidocaine Soln  Take 5 mLs by mouth 4 (four) times daily as needed for mouth pain.     ondansetron 8 MG tablet  Commonly known as:  ZOFRAN  Take 1 tablet (8 mg total)  by mouth 2 (two) times daily. Start the day after chemo for 3 days. Then take as needed for nausea or vomiting.     potassium chloride SA 20 MEQ tablet  Commonly known as:  K-DUR,KLOR-CON  Take 1 tablet (20 mEq total) by mouth daily.     prochlorperazine 10 MG tablet  Commonly known as:  COMPAZINE  TAKE 1 TABLET EVERY 6 HOURS AS NEEDED FOR NAUSEA & VOMITING.     simvastatin 20 MG tablet  Commonly known as:  ZOCOR  Take 20 mg by mouth daily at 6 PM.         PHYSICAL EXAMINATION  Oncology Vitals 01/07/2015 01/07/2015 01/07/2015 01/07/2015 01/07/2015 01/07/2015 12/30/2014  Height - - - - - - -  Weight - - - - - - -  Weight (lbs) - - - - - - -  BMI (kg/m2) - - - - - - -  Temp - - - - - 98.4 98  Pulse 86 92 93 90 90 91 75  Resp - - - - - - -  SpO2 - - - - - 100 -  BSA (m2) - - - - - -  -   BP Readings from Last 3 Encounters:  01/07/15 108/63  12/30/14 99/58  12/30/14 100/69    Physical Exam  Constitutional: She is oriented to person, place, and time. Vital signs are normal. She appears dehydrated. She appears unhealthy.  HENT:  Head: Normocephalic and atraumatic.  Eyes: Conjunctivae and EOM are normal. Pupils are equal, round, and reactive to light.  Neck: Normal range of motion.  Pulmonary/Chest: Effort normal. No respiratory distress.  Musculoskeletal: Normal range of motion.  Neurological: She is alert and oriented to person, place, and time.  Skin: Skin is warm and dry.  Psychiatric: Affect normal.  Nursing note and vitals reviewed.   LABORATORY DATA:. Appointment on 01/07/2015  Component Date Value Ref Range Status  . WBC 01/07/2015 4.9  3.9 - 10.3 10e3/uL Final  . NEUT# 01/07/2015 3.0  1.5 - 6.5 10e3/uL Final  . HGB 01/07/2015 7.5* 11.6 - 15.9 g/dL Final  . HCT 01/07/2015 23.3* 34.8 - 46.6 % Final  . Platelets 01/07/2015 89* 145 - 400 10e3/uL Final  . MCV 01/07/2015 106.4* 79.5 - 101.0 fL Final  . MCH 01/07/2015 34.2* 25.1 - 34.0 pg Final  . MCHC 01/07/2015 32.2  31.5 - 36.0 g/dL Final  . RBC 01/07/2015 2.19* 3.70 - 5.45 10e6/uL Final  . RDW 01/07/2015 19.2* 11.2 - 14.5 % Final  . lymph# 01/07/2015 1.3  0.9 - 3.3 10e3/uL Final  . MONO# 01/07/2015 0.5  0.1 - 0.9 10e3/uL Final  . Eosinophils Absolute 01/07/2015 0.0  0.0 - 0.5 10e3/uL Final  . Basophils Absolute 01/07/2015 0.0  0.0 - 0.1 10e3/uL Final  . NEUT% 01/07/2015 61.8  38.4 - 76.8 % Final  . LYMPH% 01/07/2015 27.3  14.0 - 49.7 % Final  . MONO% 01/07/2015 10.9  0.0 - 14.0 % Final  . EOS% 01/07/2015 0.0  0.0 - 7.0 % Final  . BASO% 01/07/2015 0.0  0.0 - 2.0 % Final  . nRBC 01/07/2015 0  0 - 0 % Final  . Sodium 01/07/2015 145  136 - 145 mEq/L Final  . Potassium 01/07/2015 3.4* 3.5 - 5.1 mEq/L Final  . Chloride 01/07/2015 107  98 - 109 mEq/L Final  . CO2 01/07/2015 24  22 - 29 mEq/L Final  .  Glucose 01/07/2015 99  70 - 140 mg/dl Final  .  BUN 01/07/2015 5.6* 7.0 - 26.0 mg/dL Final  . Creatinine 01/07/2015 0.8  0.6 - 1.1 mg/dL Final  . Total Bilirubin 01/07/2015 0.22  0.20 - 1.20 mg/dL Final  . Alkaline Phosphatase 01/07/2015 76  40 - 150 U/L Final  . AST 01/07/2015 17  5 - 34 U/L Final  . ALT 01/07/2015 15  0 - 55 U/L Final  . Total Protein 01/07/2015 5.2* 6.4 - 8.3 g/dL Final  . Albumin 01/07/2015 3.0* 3.5 - 5.0 g/dL Final  . Calcium 01/07/2015 6.9* 8.4 - 10.4 mg/dL Final  . Anion Gap 01/07/2015 13* 3 - 11 mEq/L Final  . EGFR 01/07/2015 80* >90 ml/min/1.73 m2 Final   eGFR is calculated using the CKD-EPI Creatinine Equation (2009)     RADIOGRAPHIC STUDIES: No results found.  ASSESSMENT/PLAN:    Antineoplastic chemotherapy induced anemia Hgb down to 7.5. Denies any active bleeding.  Also denies any increased fatigue or SOB. Most likely, anemia secondary to previous chemo. Will arrange for blood transfusion.    Breast cancer of upper-outer quadrant of left female breast Pt completed neoadjuvant taxotere/carboplatin/herceptin/perjeta chemo regimen on 12/17/14.  She is scheduled for seed localized left lumpectomy with sentinel node bx on 01/17/15 per Dr. Barry Dienes.   She received her Herceptin infusion today; and will return 01/25/15 for her next herceptin infusion.    Dehydration Pt c/o mild dizziness with position changes. Pt with BP slightly lower to 87/64.  Pt does feel dehydrated today.  Pt received 1 liter NS IV fluid rehydration today. BP improved to 108/63.    Pt advised to change positions slower to prevent any falls secondary to mild orthostatic hypotension. Pt was also encouraged to push fluids.    Hypokalemia Potassium down to 3.4 today.  Pt advised to push potassium in her diet. Will continue to monitor.    Thrombocytopenia Platelet count currently 89.  Most likely secondary to previus chemo. Pt denies any worsening issues with either easy bleeding or bruising.     Patient stated understanding of all instructions; and was in agreement with this plan of care. The patient knows to call the clinic with any problems, questions or concerns.   Review/collaboration with Dr. Lindi Adie regarding all aspects of patient's visit today.   Total time spent with patient was 40 minutes;  with greater than 75 percent of that time spent in face to face counseling regarding patient's symptoms,  and coordination of care and follow up.  Disclaimer: This note was dictated with voice recognition software. Similar sounding words can inadvertently be transcribed and may not be corrected upon review.   Drue Second, NP 01/08/2015

## 2015-01-08 NOTE — Assessment & Plan Note (Signed)
Hgb down to 7.5. Denies any active bleeding.  Also denies any increased fatigue or SOB. Most likely, anemia secondary to previous chemo. Will arrange for blood transfusion.

## 2015-01-10 ENCOUNTER — Other Ambulatory Visit: Payer: Self-pay | Admitting: Nurse Practitioner

## 2015-01-10 ENCOUNTER — Ambulatory Visit (HOSPITAL_COMMUNITY)
Admission: RE | Admit: 2015-01-10 | Discharge: 2015-01-10 | Disposition: A | Discharge status: Home / Self Care | Payer: BLUE CROSS/BLUE SHIELD | Source: Ambulatory Visit | Attending: Hematology and Oncology | Admitting: Hematology and Oncology

## 2015-01-10 ENCOUNTER — Telehealth: Payer: Self-pay

## 2015-01-10 ENCOUNTER — Ambulatory Visit (HOSPITAL_BASED_OUTPATIENT_CLINIC_OR_DEPARTMENT_OTHER): Payer: BLUE CROSS/BLUE SHIELD

## 2015-01-10 ENCOUNTER — Telehealth: Payer: Self-pay | Admitting: *Deleted

## 2015-01-10 ENCOUNTER — Ambulatory Visit (HOSPITAL_BASED_OUTPATIENT_CLINIC_OR_DEPARTMENT_OTHER): Payer: BLUE CROSS/BLUE SHIELD | Admitting: Nurse Practitioner

## 2015-01-10 VITALS — BP 116/64 | HR 81 | Temp 97.9°F | Resp 20 | Wt 174.5 lb

## 2015-01-10 VITALS — BP 105/59 | HR 88 | Temp 98.0°F | Resp 18

## 2015-01-10 DIAGNOSIS — D696 Thrombocytopenia, unspecified: Secondary | ICD-10-CM

## 2015-01-10 DIAGNOSIS — D630 Anemia in neoplastic disease: Secondary | ICD-10-CM | POA: Insufficient documentation

## 2015-01-10 DIAGNOSIS — R609 Edema, unspecified: Secondary | ICD-10-CM

## 2015-01-10 DIAGNOSIS — C801 Malignant (primary) neoplasm, unspecified: Secondary | ICD-10-CM | POA: Insufficient documentation

## 2015-01-10 DIAGNOSIS — C50212 Malignant neoplasm of upper-inner quadrant of left female breast: Secondary | ICD-10-CM

## 2015-01-10 DIAGNOSIS — D6481 Anemia due to antineoplastic chemotherapy: Secondary | ICD-10-CM

## 2015-01-10 DIAGNOSIS — T451X5A Adverse effect of antineoplastic and immunosuppressive drugs, initial encounter: Secondary | ICD-10-CM

## 2015-01-10 DIAGNOSIS — C50412 Malignant neoplasm of upper-outer quadrant of left female breast: Secondary | ICD-10-CM

## 2015-01-10 DIAGNOSIS — E876 Hypokalemia: Secondary | ICD-10-CM

## 2015-01-10 LAB — COMPREHENSIVE METABOLIC PANEL (CC13)
ALBUMIN: 3 g/dL — AB (ref 3.5–5.0)
ALT: 18 U/L (ref 0–55)
AST: 23 U/L (ref 5–34)
Alkaline Phosphatase: 74 U/L (ref 40–150)
Anion Gap: 11 mEq/L (ref 3–11)
BILIRUBIN TOTAL: 0.23 mg/dL (ref 0.20–1.20)
BUN: 4.6 mg/dL — ABNORMAL LOW (ref 7.0–26.0)
CO2: 25 meq/L (ref 22–29)
Calcium: 7 mg/dL — ABNORMAL LOW (ref 8.4–10.4)
Chloride: 107 mEq/L (ref 98–109)
Creatinine: 0.7 mg/dL (ref 0.6–1.1)
EGFR: >90 mL/min/{1.73_m2} (ref >90)
Glucose: 90 mg/dl (ref 70–140)
POTASSIUM: 3.4 meq/L — AB (ref 3.5–5.1)
SODIUM: 143 meq/L (ref 136–145)
TOTAL PROTEIN: 5.3 g/dL — AB (ref 6.4–8.3)

## 2015-01-10 LAB — CBC WITH DIFFERENTIAL/PLATELET
BASO%: 0.2 % (ref 0.0–2.0)
Basophils Absolute: 0 10*3/uL (ref 0.0–0.1)
EOS%: 0 % (ref 0.0–7.0)
Eosinophils Absolute: 0 10*3/uL (ref 0.0–0.5)
HEMATOCRIT: 23 % — AB (ref 34.8–46.6)
HGB: 7.3 g/dL — ABNORMAL LOW (ref 11.6–15.9)
LYMPH%: 25.8 % (ref 14.0–49.7)
MCH: 34 pg (ref 25.1–34.0)
MCHC: 31.9 g/dL (ref 31.5–36.0)
MCV: 106.7 fL — AB (ref 79.5–101.0)
MONO#: 0.5 10*3/uL (ref 0.1–0.9)
MONO%: 12.1 % (ref 0.0–14.0)
NEUT#: 2.7 10*3/uL (ref 1.5–6.5)
NEUT%: 61.9 % (ref 38.4–76.8)
PLATELETS: 135 10*3/uL — AB (ref 145–400)
RBC: 2.16 10*6/uL — AB (ref 3.70–5.45)
RDW: 20.8 % — ABNORMAL HIGH (ref 11.2–14.5)
WBC: 4.4 10*3/uL (ref 3.9–10.3)
lymph#: 1.1 10*3/uL (ref 0.9–3.3)

## 2015-01-10 LAB — HOLD TUBE, BLOOD BANK

## 2015-01-10 LAB — BRAIN NATRIURETIC PEPTIDE: Brain Natriuretic Peptide: 36 pg/mL (ref 0.0–100.0)

## 2015-01-10 LAB — ABO/RH: ABO/RH(D): B POS

## 2015-01-10 LAB — PREPARE RBC (CROSSMATCH)

## 2015-01-10 MED ORDER — SODIUM CHLORIDE 0.9 % IJ SOLN
10.0000 mL | INTRAMUSCULAR | Status: AC | PRN
  Administered 2015-01-10: 10 mL
  Filled 2015-01-10: qty 10

## 2015-01-10 MED ORDER — LIDOCAINE-PRILOCAINE 2.5-2.5 % EX CREA
TOPICAL_CREAM | CUTANEOUS | Status: AC
  Filled 2015-01-10: qty 5

## 2015-01-10 MED ORDER — SODIUM CHLORIDE 0.9 % IV SOLN
250.0000 mL | Freq: Once | INTRAVENOUS | Status: AC
  Administered 2015-01-10: 250 mL via INTRAVENOUS

## 2015-01-10 MED ORDER — ACETAMINOPHEN 325 MG PO TABS
650.0000 mg | ORAL_TABLET | Freq: Once | ORAL | Status: AC
  Administered 2015-01-10: 650 mg via ORAL

## 2015-01-10 MED ORDER — HEPARIN SOD (PORK) LOCK FLUSH 100 UNIT/ML IV SOLN
500.0000 [IU] | Freq: Every day | INTRAVENOUS | Status: AC | PRN
  Administered 2015-01-10: 500 [IU]
  Filled 2015-01-10: qty 5

## 2015-01-10 MED ORDER — ACETAMINOPHEN 325 MG PO TABS
ORAL_TABLET | ORAL | Status: AC
  Filled 2015-01-10: qty 2

## 2015-01-10 MED ORDER — DIPHENHYDRAMINE HCL 25 MG PO CAPS
ORAL_CAPSULE | ORAL | Status: AC
  Filled 2015-01-10: qty 1

## 2015-01-10 MED ORDER — DIPHENHYDRAMINE HCL 25 MG PO CAPS
25.0000 mg | ORAL_CAPSULE | Freq: Once | ORAL | Status: AC
Start: 2015-01-10 — End: 2015-01-10
  Administered 2015-01-10: 25 mg via ORAL

## 2015-01-10 NOTE — Telephone Encounter (Signed)
S/w pt: she states she is feeling much better than Friday except she does not know why her legs are so flippin puffy. She is at the gym. She will come to [facility] as soon as her husband picks her up from the gym.

## 2015-01-10 NOTE — Telephone Encounter (Signed)
Received call from patient stating she has bilateral leg edema.  She noticed this last night after getting up from watching a movie.  This morning she states it starts at her thighs and she has to loosely tie her shoes.  She denies any shortness of breath or pain.  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  She will come in today to see Retta Mac and have labs drawn.  Confirmed appointment for 11am labs and 1130 with [facility].

## 2015-01-10 NOTE — Patient Instructions (Signed)

## 2015-01-11 ENCOUNTER — Encounter (HOSPITAL_BASED_OUTPATIENT_CLINIC_OR_DEPARTMENT_OTHER): Payer: Self-pay | Admitting: *Deleted

## 2015-01-11 ENCOUNTER — Encounter: Payer: Self-pay | Admitting: Nurse Practitioner

## 2015-01-11 ENCOUNTER — Other Ambulatory Visit: Payer: Self-pay | Admitting: Nurse Practitioner

## 2015-01-11 ENCOUNTER — Ambulatory Visit (HOSPITAL_COMMUNITY)
Admission: RE | Admit: 2015-01-11 | Discharge: 2015-01-11 | Disposition: A | Discharge status: Home / Self Care | Payer: BLUE CROSS/BLUE SHIELD | Source: Ambulatory Visit | Attending: Hematology and Oncology | Admitting: Hematology and Oncology

## 2015-01-11 ENCOUNTER — Telehealth: Payer: Self-pay | Admitting: Nurse Practitioner

## 2015-01-11 VITALS — BP 101/61 | HR 85 | Temp 97.8°F | Resp 18

## 2015-01-11 DIAGNOSIS — C50412 Malignant neoplasm of upper-outer quadrant of left female breast: Secondary | ICD-10-CM

## 2015-01-11 DIAGNOSIS — E876 Hypokalemia: Secondary | ICD-10-CM

## 2015-01-11 DIAGNOSIS — D630 Anemia in neoplastic disease: Secondary | ICD-10-CM

## 2015-01-11 DIAGNOSIS — R609 Edema, unspecified: Secondary | ICD-10-CM

## 2015-01-11 DIAGNOSIS — C801 Malignant (primary) neoplasm, unspecified: Secondary | ICD-10-CM | POA: Diagnosis not present

## 2015-01-11 LAB — PREPARE RBC (CROSSMATCH)

## 2015-01-11 MED ORDER — FUROSEMIDE 20 MG PO TABS: 20.0000 mg | ORAL_TABLET | Freq: Every day | ORAL | Status: DC | PRN

## 2015-01-11 MED ORDER — DIPHENHYDRAMINE HCL 25 MG PO CAPS
25.0000 mg | ORAL_CAPSULE | Freq: Once | ORAL | Status: AC
  Administered 2015-01-11: 25 mg via ORAL
  Filled 2015-01-11: qty 1

## 2015-01-11 MED ORDER — HEPARIN SOD (PORK) LOCK FLUSH 100 UNIT/ML IV SOLN
500.0000 [IU] | Freq: Every day | INTRAVENOUS | Status: DC | PRN
  Filled 2015-01-11: qty 5

## 2015-01-11 MED ORDER — SODIUM CHLORIDE 0.9 % IJ SOLN: 3.0000 mL | INTRAMUSCULAR | Status: DC | PRN

## 2015-01-11 MED ORDER — SODIUM CHLORIDE 0.9 % IJ SOLN: 10.0000 mL | INTRAMUSCULAR | Status: DC | PRN

## 2015-01-11 MED ORDER — HEPARIN SOD (PORK) LOCK FLUSH 100 UNIT/ML IV SOLN: 250.0000 [IU] | INTRAVENOUS | Status: DC | PRN

## 2015-01-11 MED ORDER — ACETAMINOPHEN 325 MG PO TABS
650.0000 mg | ORAL_TABLET | Freq: Once | ORAL | Status: AC
  Administered 2015-01-11: 650 mg via ORAL
  Filled 2015-01-11: qty 2

## 2015-01-11 MED ORDER — SODIUM CHLORIDE 0.9 % IV SOLN
250.0000 mL | Freq: Once | INTRAVENOUS | Status: DC
Start: 2015-01-11 — End: 2015-01-12

## 2015-01-11 MED ORDER — POTASSIUM CHLORIDE CRYS ER 20 MEQ PO TBCR: 20.0000 meq | EXTENDED_RELEASE_TABLET | Freq: Every day | ORAL | Status: DC

## 2015-01-11 NOTE — Telephone Encounter (Signed)
Called patient what she arrived back home following her second unit of blood transfusion this afternoon.  Patient states she is feeling much better now.  Advised patient would prescribed Lasix 20 mg to try once daily in the morning for bilateral lower extremity peripheral edema.  Also advised patient to take potassium 20 mEq daily only when she is taking the Lasix.  Also reviewed with patient that her BNP was normal at 36.0.  Patient was advised to call/return if she develops any worsening symptoms whatsoever.

## 2015-01-11 NOTE — Assessment & Plan Note (Signed)
Pt completed neoadjuvant taxotere/carboplatin/herceptin/perjeta chemo regimen on 12/17/14.  She is scheduled for seed localized left lumpectomy with sentinel node bx on 01/17/15 per Dr. Barry Dienes.   She will return on 01/25/2015 for her next labs, follow up visit, and Herceptin infusion.  She knows to call me interim with any new worries or concerns.

## 2015-01-11 NOTE — Progress Notes (Signed)
Per patient she thought she was going to receive IV lasix today after infusion.  Drue Second, NP notified.  Per Caren Griffins, after review of vitals, no IV lasix is needed, and they will be in contact with the patient.  I advised the patient, and she verbalizes understanding.

## 2015-01-11 NOTE — Assessment & Plan Note (Signed)
Patient with new complaint of bilateral lower extremity edema.  She denies any worsening shortness of breath or chest pain/chest pressure.  On exam-patient does have +2 bilateral lower extremity edema.  BNP obtained today was normal at 36.0.  Most likely, peripheral edema is secondary to recent dehydration and subsequent IV fluid rehydration.  Will wait till patient finishes all of her blood transfusions-and make the decision regarding any diuretic prescription.

## 2015-01-11 NOTE — Procedures (Signed)
[facility]  Procedure Note  Julie [name] SLP:[id number] DOB: 06-23-1956 DOA: 01/11/2015   Dr. 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   Condition During Procedure:  Patient stable, denies any complaints   Condition at Discharge: ambulatory at discharge, friend at bedside.   Roberto Scales, RN  [facility]

## 2015-01-11 NOTE — Assessment & Plan Note (Signed)
Potassium 3.4.  Patient was encouraged to push potassium in her diet is much as possible.  Will continue to monitor closely

## 2015-01-11 NOTE — Assessment & Plan Note (Signed)
Platelet count has improved from 89 back up to 135.  Will continue to monitor closely.

## 2015-01-11 NOTE — Assessment & Plan Note (Signed)
Hgb down to 7.3. Denies any active bleeding.  Also denies any increased fatigue or SOB. Most likely, anemia secondary to previous chemo.  Patient will receive 2 units packed red blood cell transfusion.  She will receive the first unit of blood today; will return tomorrow to the sickle cell clinic for her second unit of blood.

## 2015-01-11 NOTE — Progress Notes (Signed)
To come in for cbc bmet-ua after seeds 3/21 hgb was 7.5-had i unit PRBC 01/11/15 Feeling better, but edema legs-maybe due low hgb-elevate legs freq

## 2015-01-11 NOTE — Progress Notes (Signed)
SYMPTOM MANAGEMENT CLINIC   HPI: Julie [name] 59 y.o. female diagnosed with breast cancer.  Pt is s/p neoadjuvant taxotere/carboplatin/herceptin/perjeta chemotherapy regimen. Continues with Herceptin infusions on an every 3 week basis. Plan is for pt to undergo seed localized lumpectomy with sentinel node bx on 01/17/15.    Patient returned to the [facility] today for chemotherapy therapy-induced anemia and hemoglobin down to 7.3.  Patient received IV fluid rehydration last week as well.  Patient has been complaining of some dizziness with sudden position changes; but states that she did feel better of the weekend since she received her IV fluid rehydration.  She is also noted some new onset bilateral lower extremity peripheral edema.  She denies any chest pain/chest pressure, or shortness of breath.  She denies any recent fevers or chills.  HPI  ROS  Past Medical History  Diagnosis Date  . Uterine cyst   . Ovarian cyst   . Irregular menstrual cycle   . Hot flashes   . Breast cancer of upper-outer quadrant of left female breast   . Wears glasses   . PONV (postoperative nausea and vomiting)     severe    Past Surgical History  Procedure Laterality Date  . Uterine ablation  2006  . Cesarean section  1991  . Colonoscopy    . Diagnostic laparoscopy  1990    ovarian cysy  . Upper gi endoscopy    . Dilation and curettage of uterus    . Portacath placement Left 08/18/2014    Procedure: INSERTION PORT-A-CATH;  Surgeon: [name], MD;  Location: [facility];  Service: General;  Laterality: Left;    has Breast cancer of upper-outer quadrant of left female breast; Mucositis due to chemotherapy; Diarrhea; Vaginal yeast infection; Dehydration; Nausea without vomiting; Neutropenia; Syncope; Hypokalemia; Antineoplastic chemotherapy induced anemia; Genetic testing; Thrombocytopenia; and Peripheral edema on her problem list.    is allergic to codeine; sulfa antibiotics;  and penicillins.    Medication List       This list is accurate as of: 01/10/15 11:59 PM.  Always use your most recent med list.               buPROPion 150 MG 12 hr tablet  Commonly known as:  WELLBUTRIN SR  Take 150 mg by mouth every evening.     cetirizine 10 MG tablet  Commonly known as:  ZYRTEC  Take 10 mg by mouth daily.     cholestyramine 4 G packet  Commonly known as:  QUESTRAN  Take 1 packet (4 g total) by mouth 3 (three) times daily with meals.     esomeprazole 40 MG capsule  Commonly known as:  NEXIUM  Take 1 capsule (40 mg total) by mouth daily at 12 noon.     fluticasone 50 MCG/ACT nasal spray  Commonly known as:  FLONASE  Place into both nostrils as needed for allergies or rhinitis.     loperamide 2 MG capsule  Commonly known as:  IMODIUM  Take 1 capsule (2 mg total) by mouth as needed for diarrhea or loose stools.     LORazepam 0.5 MG tablet  Commonly known as:  ATIVAN  Take 1 tablet (0.5 mg total) by mouth every 6 (six) hours as needed (Nausea or vomiting).     ondansetron 8 MG tablet  Commonly known as:  ZOFRAN  Take 1 tablet (8 mg total) by mouth 2 (two) times daily. Start the day after chemo for 3  days. Then take as needed for nausea or vomiting.     potassium chloride SA 20 MEQ tablet  Commonly known as:  K-DUR,KLOR-CON  Take 1 tablet (20 mEq total) by mouth daily.     prochlorperazine 10 MG tablet  Commonly known as:  COMPAZINE  TAKE 1 TABLET EVERY 6 HOURS AS NEEDED FOR NAUSEA & VOMITING.     simvastatin 20 MG tablet  Commonly known as:  ZOCOR  Take 20 mg by mouth daily at 6 PM.         PHYSICAL EXAMINATION  Oncology Vitals 01/11/2015 01/11/2015 01/11/2015 01/11/2015 01/10/2015 01/10/2015 01/10/2015  Height 158 cm - - - - - -  Weight 78.926 kg - - - - - -  Weight (lbs) 174 lbs - - - - - -  BMI (kg/m2) 31.83 kg/m2 - - - - - -  Temp - 97.8 98.7 98 98 98.5 98.1  Pulse - 85 89 83 88 88 91  Resp - 18 17 - '18 18 18  ' SpO2 - 100 98 100 98 - -    BSA (m2) 1.86 m2 - - - - - -   BP Readings from Last 3 Encounters:  01/10/15 105/59  01/10/15 116/64  01/07/15 108/63    Physical Exam  Constitutional: She is oriented to person, place, and time. Vital signs are normal. She appears dehydrated. She appears unhealthy.  HENT:  Head: Normocephalic and atraumatic.  Eyes: Conjunctivae and EOM are normal. Pupils are equal, round, and reactive to light.  Neck: Normal range of motion.  Pulmonary/Chest: Effort normal. No respiratory distress.  Musculoskeletal: Normal range of motion. She exhibits edema.  +2 bilateral lower extremity edema noted on exam today.  Neurological: She is alert and oriented to person, place, and time.  Skin: Skin is warm and dry.  Psychiatric: Affect normal.  Nursing note and vitals reviewed.   LABORATORY DATA:. Appointment on 01/10/2015  Component Date Value Ref Range Status  . WBC 01/10/2015 4.4  3.9 - 10.3 10e3/uL Final  . NEUT# 01/10/2015 2.7  1.5 - 6.5 10e3/uL Final  . HGB 01/10/2015 7.3* 11.6 - 15.9 g/dL Final  . HCT 01/10/2015 23.0* 34.8 - 46.6 % Final  . Platelets 01/10/2015 135* 145 - 400 10e3/uL Final  . MCV 01/10/2015 106.7* 79.5 - 101.0 fL Final  . MCH 01/10/2015 34.0  25.1 - 34.0 pg Final  . MCHC 01/10/2015 31.9  31.5 - 36.0 g/dL Final  . RBC 01/10/2015 2.16* 3.70 - 5.45 10e6/uL Final  . RDW 01/10/2015 20.8* 11.2 - 14.5 % Final  . lymph# 01/10/2015 1.1  0.9 - 3.3 10e3/uL Final  . MONO# 01/10/2015 0.5  0.1 - 0.9 10e3/uL Final  . Eosinophils Absolute 01/10/2015 0.0  0.0 - 0.5 10e3/uL Final  . Basophils Absolute 01/10/2015 0.0  0.0 - 0.1 10e3/uL Final  . NEUT% 01/10/2015 61.9  38.4 - 76.8 % Final  . LYMPH% 01/10/2015 25.8  14.0 - 49.7 % Final  . MONO% 01/10/2015 12.1  0.0 - 14.0 % Final  . EOS% 01/10/2015 0.0  0.0 - 7.0 % Final  . BASO% 01/10/2015 0.2  0.0 - 2.0 % Final  . Sodium 01/10/2015 143  136 - 145 mEq/L Final  . Potassium 01/10/2015 3.4* 3.5 - 5.1 mEq/L Final  . Chloride 01/10/2015  107  98 - 109 mEq/L Final  . CO2 01/10/2015 25  22 - 29 mEq/L Final  . Glucose 01/10/2015 90  70 - 140 mg/dl Final  . BUN 01/10/2015  4.6* 7.0 - 26.0 mg/dL Final  . Creatinine 01/10/2015 0.7  0.6 - 1.1 mg/dL Final  . Total Bilirubin 01/10/2015 0.23  0.20 - 1.20 mg/dL Final  . Alkaline Phosphatase 01/10/2015 74  40 - 150 U/L Final  . AST 01/10/2015 23  5 - 34 U/L Final  . ALT 01/10/2015 18  0 - 55 U/L Final  . Total Protein 01/10/2015 5.3* 6.4 - 8.3 g/dL Final  . Albumin 01/10/2015 3.0* 3.5 - 5.0 g/dL Final  . Calcium 01/10/2015 7.0* 8.4 - 10.4 mg/dL Final  . Anion Gap 01/10/2015 11  3 - 11 mEq/L Final  . EGFR 01/10/2015 >90  >90 ml/min/1.73 m2 Final   eGFR is calculated using the CKD-EPI Creatinine Equation (2009)  . Hold Tube, Blood Bank 01/10/2015 Type and Crossmatch Added   Final  . Brain Natriuretic Peptide 01/10/2015 36.0  0.0 - 100.0 pg/mL Final  Hospital Outpatient Visit on 01/10/2015  Component Date Value Ref Range Status  . Order Confirmation 01/10/2015 ORDER PROCESSED BY BLOOD BANK   Final  . ABO/RH(D) 01/10/2015 B POS   Final  . Antibody Screen 01/10/2015 NEG   Final  . Sample Expiration 01/10/2015 01/13/2015   Final  . Unit Number 01/10/2015 [id number]   Final  . Blood Component Type 01/10/2015 RED CELLS,LR   Final  . Unit division 01/10/2015 00   Final  . Status of Unit 01/10/2015 ISSUED,FINAL   Final  . Transfusion Status 01/10/2015 OK TO TRANSFUSE   Final  . Crossmatch Result 01/10/2015 Compatible   Final  . Unit Number 01/10/2015 [id number]   Final  . Blood Component Type 01/10/2015 RED CELLS,LR   Final  . Unit division 01/10/2015 00   Final  . Status of Unit 01/10/2015 ISSUED   Final  . Transfusion Status 01/10/2015 OK TO TRANSFUSE   Final  . Crossmatch Result 01/10/2015 Compatible   Final  . ABO/RH(D) 01/10/2015 B POS   Final     RADIOGRAPHIC STUDIES: No results found.  ASSESSMENT/PLAN:    Antineoplastic chemotherapy induced anemia Hgb down to 7.3.  Denies any active bleeding.  Also denies any increased fatigue or SOB. Most likely, anemia secondary to previous chemo.  Patient will receive 2 units packed red blood cell transfusion.  She will receive the first unit of blood today; will return tomorrow to the [facility] clinic for her second unit of blood.     Breast cancer of upper-outer quadrant of left female breast Pt completed neoadjuvant taxotere/carboplatin/herceptin/perjeta chemo regimen on 12/17/14.  She is scheduled for seed localized left lumpectomy with sentinel node bx on 01/17/15 per Dr. Barry Dienes.   She will return on 01/25/2015 for her next labs, follow up visit, and Herceptin infusion.  She knows to call me interim with any new worries or concerns.     Hypokalemia Potassium 3.4.  Patient was encouraged to push potassium in her diet is much as possible.  Will continue to monitor closely   Peripheral edema Patient with new complaint of bilateral lower extremity edema.  She denies any worsening shortness of breath or chest pain/chest pressure.  On exam-patient does have +2 bilateral lower extremity edema.  BNP obtained today was normal at 36.0.  Most likely, peripheral edema is secondary to recent dehydration and subsequent IV fluid rehydration.  Will wait till patient finishes all of her blood transfusions-and make the decision regarding any diuretic prescription.   Thrombocytopenia Platelet count has improved from 89 back up to 135.  Will continue to  monitor closely.    Patient stated understanding of all instructions; and was in agreement with this plan of care. The patient knows to call the clinic with any problems, questions or concerns.   Review/collaboration with Dr. Lindi Adie regarding all aspects of patient's visit today.   Total time spent with patient was 40 minutes;  with greater than 75 percent of that time spent in face to face counseling regarding patient's symptoms,  and coordination of care and follow  up.  Disclaimer: This note was dictated with voice recognition software. Similar sounding words can inadvertently be transcribed and may not be corrected upon review.   Drue Second, NP 01/11/2015

## 2015-01-12 LAB — TYPE AND SCREEN
ABO/RH(D): B POS
ANTIBODY SCREEN: NEGATIVE
Unit division: 0
Unit division: 0

## 2015-01-17 ENCOUNTER — Encounter (HOSPITAL_BASED_OUTPATIENT_CLINIC_OR_DEPARTMENT_OTHER)
Admission: RE | Admit: 2015-01-17 | Discharge: 2015-01-17 | Disposition: A | Discharge status: Home / Self Care | Payer: BLUE CROSS/BLUE SHIELD | Source: Ambulatory Visit | Attending: General Surgery | Admitting: General Surgery

## 2015-01-17 ENCOUNTER — Ambulatory Visit
Admission: RE | Admit: 2015-01-17 | Discharge: 2015-01-17 | Disposition: A | Discharge status: Home / Self Care | Payer: BLUE CROSS/BLUE SHIELD | Source: Ambulatory Visit | Attending: General Surgery | Admitting: General Surgery

## 2015-01-17 DIAGNOSIS — C50212 Malignant neoplasm of upper-inner quadrant of left female breast: Secondary | ICD-10-CM

## 2015-01-17 DIAGNOSIS — Z886 Allergy status to analgesic agent status: Secondary | ICD-10-CM | POA: Diagnosis not present

## 2015-01-17 DIAGNOSIS — Z803 Family history of malignant neoplasm of breast: Secondary | ICD-10-CM | POA: Diagnosis not present

## 2015-01-17 DIAGNOSIS — Z171 Estrogen receptor negative status [ER-]: Secondary | ICD-10-CM | POA: Diagnosis not present

## 2015-01-17 DIAGNOSIS — I10 Essential (primary) hypertension: Secondary | ICD-10-CM | POA: Diagnosis not present

## 2015-01-17 DIAGNOSIS — K219 Gastro-esophageal reflux disease without esophagitis: Secondary | ICD-10-CM | POA: Diagnosis not present

## 2015-01-17 DIAGNOSIS — E876 Hypokalemia: Secondary | ICD-10-CM | POA: Diagnosis not present

## 2015-01-17 DIAGNOSIS — Z880 Allergy status to penicillin: Secondary | ICD-10-CM | POA: Diagnosis not present

## 2015-01-17 DIAGNOSIS — Z6831 Body mass index (BMI) 31.0-31.9, adult: Secondary | ICD-10-CM | POA: Diagnosis not present

## 2015-01-17 DIAGNOSIS — Z882 Allergy status to sulfonamides status: Secondary | ICD-10-CM | POA: Diagnosis not present

## 2015-01-17 DIAGNOSIS — D6481 Anemia due to antineoplastic chemotherapy: Secondary | ICD-10-CM | POA: Diagnosis not present

## 2015-01-17 DIAGNOSIS — E780 Pure hypercholesterolemia: Secondary | ICD-10-CM | POA: Diagnosis not present

## 2015-01-17 LAB — CBC WITH DIFFERENTIAL/PLATELET
BASOS ABS: 0 10*3/uL (ref 0.0–0.1)
Basophils Relative: 0 % (ref 0–1)
Eosinophils Absolute: 0 10*3/uL (ref 0.0–0.7)
Eosinophils Relative: 1 % (ref 0–5)
HCT: 32.2 % — ABNORMAL LOW (ref 36.0–46.0)
HEMOGLOBIN: 10.4 g/dL — AB (ref 12.0–15.0)
LYMPHS PCT: 26 % (ref 12–46)
Lymphs Abs: 0.9 10*3/uL (ref 0.7–4.0)
MCH: 33.5 pg (ref 26.0–34.0)
MCHC: 32.3 g/dL (ref 30.0–36.0)
MCV: 103.9 fL — ABNORMAL HIGH (ref 78.0–100.0)
MONOS PCT: 11 % (ref 3–12)
Monocytes Absolute: 0.4 10*3/uL (ref 0.1–1.0)
NEUTROS ABS: 2.2 10*3/uL (ref 1.7–7.7)
Neutrophils Relative %: 62 % (ref 43–77)
Platelets: 174 10*3/uL (ref 150–400)
RBC: 3.1 MIL/uL — ABNORMAL LOW (ref 3.87–5.11)
RDW: 19.6 % — ABNORMAL HIGH (ref 11.5–15.5)
WBC: 3.6 10*3/uL — AB (ref 4.0–10.5)

## 2015-01-17 LAB — URINALYSIS, ROUTINE W REFLEX MICROSCOPIC
BILIRUBIN URINE: NEGATIVE
GLUCOSE, UA: NEGATIVE mg/dL
Hgb urine dipstick: NEGATIVE
Ketones, ur: NEGATIVE mg/dL
NITRITE: NEGATIVE
PH: 6 (ref 5.0–8.0)
Protein, ur: NEGATIVE mg/dL
Specific Gravity, Urine: 1.015 (ref 1.005–1.030)
Urobilinogen, UA: 1 mg/dL (ref 0.0–1.0)

## 2015-01-17 LAB — URINE MICROSCOPIC-ADD ON

## 2015-01-17 LAB — BASIC METABOLIC PANEL
Anion gap: 8 (ref 5–15)
BUN: <5 mg/dL — ABNORMAL LOW (ref 6–23)
CALCIUM: 7.5 mg/dL — AB (ref 8.4–10.5)
CO2: 29 mmol/L (ref 19–32)
Chloride: 105 mmol/L (ref 96–112)
Creatinine, Ser: 0.85 mg/dL (ref 0.50–1.10)
GFR calc Af Amer: 87 mL/min — ABNORMAL LOW (ref >90)
GFR, EST NON AFRICAN AMERICAN: 75 mL/min — AB (ref >90)
GLUCOSE: 98 mg/dL (ref 70–99)
Potassium: 3.9 mmol/L (ref 3.5–5.1)
Sodium: 142 mmol/L (ref 135–145)

## 2015-01-17 NOTE — Progress Notes (Signed)
Pt here for labs, UA and BP 110/70.

## 2015-01-17 NOTE — Pre-Procedure Instructions (Signed)
Spoke with Anderson Malta at [facility] to notify Dr. Barry Dienes of abnormal lab results from today; also emailed Dr. Barry Dienes to review labs.

## 2015-01-18 ENCOUNTER — Ambulatory Visit (HOSPITAL_BASED_OUTPATIENT_CLINIC_OR_DEPARTMENT_OTHER): Payer: BLUE CROSS/BLUE SHIELD | Admitting: Certified Registered"

## 2015-01-18 ENCOUNTER — Ambulatory Visit (HOSPITAL_COMMUNITY)
Admission: RE | Admit: 2015-01-18 | Discharge: 2015-01-18 | Disposition: A | Discharge status: Home / Self Care | Payer: BLUE CROSS/BLUE SHIELD | Source: Ambulatory Visit | Attending: General Surgery | Admitting: General Surgery

## 2015-01-18 ENCOUNTER — Ambulatory Visit (HOSPITAL_BASED_OUTPATIENT_CLINIC_OR_DEPARTMENT_OTHER)
Admission: RE | Admit: 2015-01-18 | Discharge: 2015-01-18 | Disposition: A | Discharge status: Home / Self Care | Payer: BLUE CROSS/BLUE SHIELD | Source: Ambulatory Visit | Attending: General Surgery | Admitting: General Surgery

## 2015-01-18 ENCOUNTER — Encounter (HOSPITAL_BASED_OUTPATIENT_CLINIC_OR_DEPARTMENT_OTHER)
Admission: RE | Disposition: A | Discharge status: Home / Self Care | Payer: Self-pay | Source: Ambulatory Visit | Attending: General Surgery

## 2015-01-18 ENCOUNTER — Encounter (HOSPITAL_BASED_OUTPATIENT_CLINIC_OR_DEPARTMENT_OTHER): Payer: Self-pay | Admitting: Certified Registered"

## 2015-01-18 ENCOUNTER — Ambulatory Visit
Admission: RE | Admit: 2015-01-18 | Discharge: 2015-01-18 | Disposition: A | Discharge status: Home / Self Care | Payer: BLUE CROSS/BLUE SHIELD | Source: Ambulatory Visit | Attending: General Surgery | Admitting: General Surgery

## 2015-01-18 DIAGNOSIS — Z880 Allergy status to penicillin: Secondary | ICD-10-CM | POA: Insufficient documentation

## 2015-01-18 DIAGNOSIS — C50212 Malignant neoplasm of upper-inner quadrant of left female breast: Secondary | ICD-10-CM

## 2015-01-18 DIAGNOSIS — Z6831 Body mass index (BMI) 31.0-31.9, adult: Secondary | ICD-10-CM | POA: Insufficient documentation

## 2015-01-18 DIAGNOSIS — Z882 Allergy status to sulfonamides status: Secondary | ICD-10-CM | POA: Insufficient documentation

## 2015-01-18 DIAGNOSIS — Z803 Family history of malignant neoplasm of breast: Secondary | ICD-10-CM | POA: Insufficient documentation

## 2015-01-18 DIAGNOSIS — Z171 Estrogen receptor negative status [ER-]: Secondary | ICD-10-CM | POA: Insufficient documentation

## 2015-01-18 DIAGNOSIS — Z886 Allergy status to analgesic agent status: Secondary | ICD-10-CM | POA: Insufficient documentation

## 2015-01-18 DIAGNOSIS — E876 Hypokalemia: Secondary | ICD-10-CM | POA: Insufficient documentation

## 2015-01-18 DIAGNOSIS — I10 Essential (primary) hypertension: Secondary | ICD-10-CM | POA: Insufficient documentation

## 2015-01-18 DIAGNOSIS — K219 Gastro-esophageal reflux disease without esophagitis: Secondary | ICD-10-CM | POA: Insufficient documentation

## 2015-01-18 DIAGNOSIS — D6481 Anemia due to antineoplastic chemotherapy: Secondary | ICD-10-CM | POA: Insufficient documentation

## 2015-01-18 DIAGNOSIS — E780 Pure hypercholesterolemia: Secondary | ICD-10-CM | POA: Insufficient documentation

## 2015-01-18 HISTORY — PX: RADIOACTIVE SEED GUIDED PARTIAL MASTECTOMY WITH AXILLARY SENTINEL LYMPH NODE BIOPSY: SHX6520

## 2015-01-18 SURGERY — RADIOACTIVE SEED GUIDED PARTIAL MASTECTOMY WITH AXILLARY SENTINEL LYMPH NODE BIOPSY
Anesthesia: Regional | Site: Breast | Laterality: Left

## 2015-01-18 MED ORDER — FENTANYL CITRATE 0.05 MG/ML IJ SOLN: 25.0000 ug | INTRAMUSCULAR | Status: DC | PRN

## 2015-01-18 MED ORDER — PROPOFOL 10 MG/ML IV BOLUS
INTRAVENOUS | Status: AC
  Filled 2015-01-18: qty 40

## 2015-01-18 MED ORDER — PROPOFOL 10 MG/ML IV BOLUS
INTRAVENOUS | Status: AC
  Filled 2015-01-18: qty 20

## 2015-01-18 MED ORDER — PROMETHAZINE HCL 25 MG/ML IJ SOLN: 6.2500 mg | INTRAMUSCULAR | Status: DC | PRN

## 2015-01-18 MED ORDER — FENTANYL CITRATE 0.05 MG/ML IJ SOLN
INTRAMUSCULAR | Status: AC
  Filled 2015-01-18: qty 4

## 2015-01-18 MED ORDER — MIDAZOLAM HCL 2 MG/2ML IJ SOLN
1.0000 mg | INTRAMUSCULAR | Status: DC | PRN
  Administered 2015-01-18 (×2): 1 mg via INTRAVENOUS

## 2015-01-18 MED ORDER — SCOPOLAMINE 1 MG/3DAYS TD PT72
MEDICATED_PATCH | TRANSDERMAL | Status: AC
  Filled 2015-01-18: qty 1

## 2015-01-18 MED ORDER — LIDOCAINE-EPINEPHRINE (PF) 1 %-1:200000 IJ SOLN
INTRAMUSCULAR | Status: DC | PRN
  Administered 2015-01-18: 2 mL via INTRAMUSCULAR

## 2015-01-18 MED ORDER — BUPIVACAINE-EPINEPHRINE (PF) 0.5% -1:200000 IJ SOLN
INTRAMUSCULAR | Status: DC | PRN
  Administered 2015-01-18: 30 mL

## 2015-01-18 MED ORDER — CIPROFLOXACIN IN D5W 400 MG/200ML IV SOLN
400.0000 mg | INTRAVENOUS | Status: AC
  Administered 2015-01-18: 400 mg via INTRAVENOUS

## 2015-01-18 MED ORDER — FENTANYL CITRATE 0.05 MG/ML IJ SOLN
INTRAMUSCULAR | Status: DC | PRN
  Administered 2015-01-18: 50 ug via INTRAVENOUS

## 2015-01-18 MED ORDER — FENTANYL CITRATE 0.05 MG/ML IJ SOLN
INTRAMUSCULAR | Status: AC
  Filled 2015-01-18: qty 6

## 2015-01-18 MED ORDER — LACTATED RINGERS IV SOLN
INTRAVENOUS | Status: DC
  Administered 2015-01-18 (×2): via INTRAVENOUS

## 2015-01-18 MED ORDER — TRAMADOL HCL 50 MG PO TABS: 50.0000 mg | ORAL_TABLET | Freq: Four times a day (QID) | ORAL | Status: DC | PRN

## 2015-01-18 MED ORDER — PROPOFOL 10 MG/ML IV BOLUS
INTRAVENOUS | Status: DC | PRN
  Administered 2015-01-18: 150 mg via INTRAVENOUS

## 2015-01-18 MED ORDER — MIDAZOLAM HCL 5 MG/5ML IJ SOLN
INTRAMUSCULAR | Status: DC | PRN
  Administered 2015-01-18: 1 mg via INTRAVENOUS

## 2015-01-18 MED ORDER — CIPROFLOXACIN IN D5W 400 MG/200ML IV SOLN
INTRAVENOUS | Status: AC
  Filled 2015-01-18: qty 200

## 2015-01-18 MED ORDER — FENTANYL CITRATE 0.05 MG/ML IJ SOLN
INTRAMUSCULAR | Status: AC
  Filled 2015-01-18: qty 2

## 2015-01-18 MED ORDER — MIDAZOLAM HCL 2 MG/2ML IJ SOLN
INTRAMUSCULAR | Status: AC
  Filled 2015-01-18: qty 2

## 2015-01-18 MED ORDER — TECHNETIUM TC 99M SULFUR COLLOID FILTERED
1.0000 | Freq: Once | INTRAVENOUS | Status: AC | PRN
  Administered 2015-01-18: 1 via INTRADERMAL

## 2015-01-18 MED ORDER — SCOPOLAMINE 1 MG/3DAYS TD PT72
MEDICATED_PATCH | TRANSDERMAL | Status: DC | PRN
  Administered 2015-01-18: 1 via TRANSDERMAL

## 2015-01-18 MED ORDER — FENTANYL CITRATE 0.05 MG/ML IJ SOLN
50.0000 ug | INTRAMUSCULAR | Status: DC | PRN
  Administered 2015-01-18 (×2): 50 ug via INTRAVENOUS

## 2015-01-18 MED ORDER — DEXAMETHASONE SODIUM PHOSPHATE 4 MG/ML IJ SOLN
INTRAMUSCULAR | Status: DC | PRN
  Administered 2015-01-18: 10 mg via INTRAVENOUS

## 2015-01-18 SURGICAL SUPPLY — 58 items
APPLIER CLIP 9.375 MED OPEN (MISCELLANEOUS)
BINDER BREAST LRG (GAUZE/BANDAGES/DRESSINGS) IMPLANT
BINDER BREAST MEDIUM (GAUZE/BANDAGES/DRESSINGS) IMPLANT
BINDER BREAST XLRG (GAUZE/BANDAGES/DRESSINGS) IMPLANT
BINDER BREAST XXLRG (GAUZE/BANDAGES/DRESSINGS) IMPLANT
BLADE HEX COATED 2.75 (ELECTRODE) ×2 IMPLANT
BLADE SURG 10 STRL SS (BLADE) ×2 IMPLANT
BLADE SURG 15 STRL LF DISP TIS (BLADE) ×1 IMPLANT
BLADE SURG 15 STRL SS (BLADE) ×1
BNDG COHESIVE 4X5 TAN STRL (GAUZE/BANDAGES/DRESSINGS) ×2 IMPLANT
CANISTER SUC SOCK COL 7IN (MISCELLANEOUS) IMPLANT
CANISTER SUCT 1200ML W/VALVE (MISCELLANEOUS) IMPLANT
CHLORAPREP W/TINT 26ML (MISCELLANEOUS) ×2 IMPLANT
CLIP APPLIE 9.375 MED OPEN (MISCELLANEOUS) IMPLANT
CLIP TI LARGE 6 (CLIP) ×2 IMPLANT
CLIP TI MEDIUM 6 (CLIP) ×2 IMPLANT
CLIP TI WIDE RED SMALL 6 (CLIP) IMPLANT
COVER MAYO STAND STRL (DRAPES) ×2 IMPLANT
COVER PROBE W GEL 5X96 (DRAPES) ×2 IMPLANT
DECANTER SPIKE VIAL GLASS SM (MISCELLANEOUS) IMPLANT
DEVICE DUBIN W/COMP PLATE 8390 (MISCELLANEOUS) ×2 IMPLANT
DRAPE UTILITY XL STRL (DRAPES) ×2 IMPLANT
DRSG PAD ABDOMINAL 8X10 ST (GAUZE/BANDAGES/DRESSINGS) IMPLANT
ELECT REM PT RETURN 9FT ADLT (ELECTROSURGICAL) ×2
ELECTRODE REM PT RTRN 9FT ADLT (ELECTROSURGICAL) ×1 IMPLANT
GLOVE BIO SURGEON STRL SZ 6 (GLOVE) ×2 IMPLANT
GLOVE BIOGEL PI IND STRL 6.5 (GLOVE) ×1 IMPLANT
GLOVE BIOGEL PI INDICATOR 6.5 (GLOVE) ×1
GOWN STRL REUS W/ TWL LRG LVL3 (GOWN DISPOSABLE) ×1 IMPLANT
GOWN STRL REUS W/TWL 2XL LVL3 (GOWN DISPOSABLE) ×2 IMPLANT
GOWN STRL REUS W/TWL LRG LVL3 (GOWN DISPOSABLE) ×1
KIT MARKER MARGIN INK (KITS) ×2 IMPLANT
LIQUID BAND (GAUZE/BANDAGES/DRESSINGS) ×2 IMPLANT
NEEDLE HYPO 25X1 1.5 SAFETY (NEEDLE) ×2 IMPLANT
NS IRRIG 1000ML POUR BTL (IV SOLUTION) IMPLANT
PACK BASIN DAY SURGERY FS (CUSTOM PROCEDURE TRAY) ×2 IMPLANT
PACK UNIVERSAL I (CUSTOM PROCEDURE TRAY) ×2 IMPLANT
PENCIL BUTTON HOLSTER BLD 10FT (ELECTRODE) ×2 IMPLANT
SLEEVE SCD COMPRESS KNEE MED (MISCELLANEOUS) ×2 IMPLANT
SPONGE GAUZE 4X4 12PLY STER LF (GAUZE/BANDAGES/DRESSINGS) ×2 IMPLANT
SPONGE LAP 18X18 X RAY DECT (DISPOSABLE) ×2 IMPLANT
STAPLER VISISTAT 35W (STAPLE) IMPLANT
STOCKINETTE IMPERVIOUS LG (DRAPES) ×2 IMPLANT
STRIP CLOSURE SKIN 1/2X4 (GAUZE/BANDAGES/DRESSINGS) ×2 IMPLANT
SUT ETHILON 2 0 FS 18 (SUTURE) IMPLANT
SUT MNCRL AB 4-0 PS2 18 (SUTURE) ×2 IMPLANT
SUT MON AB 5-0 PS2 18 (SUTURE) IMPLANT
SUT SILK 2 0 SH (SUTURE) IMPLANT
SUT VIC AB 2-0 SH 27 (SUTURE) ×1
SUT VIC AB 2-0 SH 27XBRD (SUTURE) ×1 IMPLANT
SUT VIC AB 3-0 SH 27 (SUTURE) ×1
SUT VIC AB 3-0 SH 27X BRD (SUTURE) ×1 IMPLANT
SUT VIC AB 5-0 PS2 18 (SUTURE) IMPLANT
SYR CONTROL 10ML LL (SYRINGE) ×2 IMPLANT
TOWEL OR 17X24 6PK STRL BLUE (TOWEL DISPOSABLE) ×2 IMPLANT
TOWEL OR NON WOVEN STRL DISP B (DISPOSABLE) ×2 IMPLANT
TUBE CONNECTING 20X1/4 (TUBING) ×2 IMPLANT
YANKAUER SUCT BULB TIP NO VENT (SUCTIONS) IMPLANT

## 2015-01-18 NOTE — Transfer of Care (Signed)
Immediate Anesthesia Transfer of Care Note  Patient: Julie [name]  Procedure(s) Performed: Procedure(s): RADIOACTIVE SEED GUIDED PARTIAL MASTECTOMY WITH AXILLARY SENTINEL LYMPH NODE BIOPSY (Left)  Patient Location: PACU  Anesthesia Type:GA combined with regional for post-op pain  Level of Consciousness: awake, sedated and patient cooperative  Airway & Oxygen Therapy: Patient Spontanous Breathing and Patient connected to face mask oxygen  Post-op Assessment: Report given to RN and Post -op Vital signs reviewed and stable  Post vital signs: Reviewed and stable  Last Vitals:  Filed Vitals:   01/18/15 1235  BP:   Pulse: 87  Temp:   Resp: 18    Complications: No apparent anesthesia complications

## 2015-01-18 NOTE — Anesthesia Procedure Notes (Addendum)
Anesthesia Regional Block:  Pectoralis block  Pre-Anesthetic Checklist: ,, timeout performed, Correct Patient, Correct Site, Correct Laterality, Correct Procedure, Correct Position, site marked, Risks and benefits discussed,  Surgical consent,  Pre-op evaluation,  At surgeon's request and post-op pain management  Laterality: Left  Prep: chloraprep       Needles:  Injection technique: Single-shot  Needle Type: Echogenic Needle     Needle Length: 9cm 9 cm Needle Gauge: 21 and 21 G    Additional Needles:  Procedures: ultrasound guided (picture in chart) Pectoralis block Narrative:  Start time: 01/18/2015 12:20 PM End time: 01/18/2015 12:30 PM  Performed by: Personally  Anesthesiologist: Suzette Battiest  Additional Notes: Risks and benefits explained to pt. Pt tolerated procedure well with no immediate complications.   Procedure Name: LMA Insertion Date/Time: 01/18/2015 12:46 PM Performed by: Baxter Flattery Pre-anesthesia Checklist: Patient identified, Emergency Drugs available, Suction available and Patient being monitored Patient Re-evaluated:Patient Re-evaluated prior to inductionOxygen Delivery Method: Circle System Utilized Preoxygenation: Pre-oxygenation with 100% oxygen Intubation Type: IV induction Ventilation: Mask ventilation without difficulty LMA: LMA inserted LMA Size: 4.0 Number of attempts: 1 Airway Equipment and Method: Bite block Placement Confirmation: positive ETCO2 and breath sounds checked- equal and bilateral Tube secured with: Tape Dental Injury: Teeth and Oropharynx as per pre-operative assessment

## 2015-01-18 NOTE — H&P (Signed)
Julie [name] 01/03/2015 9:53 AM Location: [facility] Patient #: [id number] DOB: [birth year] Married / Language: [name] / Race: White Female  History of Present Illness Julie Klein MD; 01/03/2015 10:37 AM) Patient words: breast cancer follow up.  The patient is a 59 year old female who presents with breast cancer. Previous history [The patient is being seen for a consultation for Stage II ( T2 and N0 ) invasive ductal carcinoma (Grade 3, Ki67 90%) of the left breast (upper inner quadrant). Tumor markers include estrogen receptor negative, progesterone receptor negative and HER -2/neu positive. The patient was referred by a primary care provider (Dr. Maudry Mayhew). Initial presentation was for breast mass on medical examination (Mass was detected on screening mammo, but it was present at exam for diagnostic mammogram.) and an abnormal mammogram. Current diagnosis was determined by mammography (2.3 x 2.0 x 1.4 cm), left breast ultrasound and core needle biopsy. Symptoms include skin changes in the involved breast (bruising), while symptoms do not include breast pain, nipple discharge, fatigue, nausea or vomiting. Risk factors include breast cancer in a second degree relative. She is postmenopausal.] The patient had additional biopsies that were negative for bilateral breast cancer. She has been undergoing chemotherapy including Herceptin. She had her last dose of chemotherapy on February 19. She will continue receiving the Herceptin for the remainder of the year. She had some diarrhea, fatigue, and loss of taste. The last chemotherapy treatment was more difficult to get through than the previous ones.   Other Problems Elbert Ewings, CMA; 01/03/2015 9:53 AM) Breast Cancer Gastroesophageal Reflux Disease Hemorrhoids High blood pressure Hypercholesterolemia Lump In Breast  Past Surgical History Elbert Ewings, CMA; 01/03/2015 9:53 AM) Breast Biopsy Bilateral. Cesarean Section -  1 Oral Surgery  Diagnostic Studies History Elbert Ewings, CMA; 01/03/2015 9:53 AM) Colonoscopy 5-10 years ago 1-5 years ago Mammogram within last year Pap Smear 1-5 years ago  Allergies Elbert Ewings, CMA; 01/03/2015 9:54 AM) Codeine Sulfate *ANALGESICS - OPIOID* Sulfa 10 *OPHTHALMIC AGENTS* Penicillin G Procaine *PENICILLINS*  Medication History Elbert Ewings, CMA; 01/03/2015 9:58 AM) Alum & Mag Hydroxide-Simeth (200-200-20MG Capsule, Oral) Active. Wellbutrin SR (150MG Tablet ER 12HR, Oral) Active. ZyrTEC Allergy (10MG Tablet, Oral as needed) Active. Questran (4GM Packet, Oral) Active. Decadron (4MG Tablet, Oral) Active. NexIUM (40MG Capsule DR, Oral) Active. Flonase (50MCG/ACT Suspension, Nasal) Active. Norco (5-325MG Tablet, Oral) Active. EMLA (2.5-2.5% Cream, External) Active. Imodium Advanced (2-125MG Tablet Chewable, Oral) Active. Ativan (0.5MG Tablet, Oral) Active. Zofran (8MG Tablet, Oral) Active. Potassium Bicarbonate (20MEQ Tablet Effer, Oral) Active. Compazine (10MG Tablet, Oral) Active. Zocor (20MG Tablet, Oral) Active. Medications Reconciled  Social History Elbert Ewings, [place]; 01/03/2015 9:53 AM) Alcohol use Occasional alcohol use. Illicit drug use Remotely quit drug use. No caffeine use Tobacco use Never smoker.  Family History Elbert Ewings, [place]; 01/03/2015 9:53 AM) Heart Disease Mother. Hypertension Father, Mother. Prostate Cancer Father.  Pregnancy / Birth History Elbert Ewings, CMA; 01/03/2015 9:53 AM) Age at menarche 73 years. Age of menopause 50-60 <45 Contraceptive History Oral contraceptives. Gravida 3 Irregular periods Maternal age 50-20 Para 2  Review of Systems Elbert Ewings CMA; 01/03/2015 9:53 AM) General Present- Appetite Loss, Fatigue and Weight Loss. Not Present- Chills, Fever, Night Sweats and Weight Gain. Skin Not Present- Change in Wart/Mole, Dryness, Hives, Jaundice, New Lesions, Non-Healing Wounds, Rash and  Ulcer. HEENT Present- Seasonal Allergies and Wears glasses/contact lenses. Not Present- Earache, Hearing Loss, Hoarseness, Nose Bleed, Oral Ulcers, Ringing in the Ears, Sinus Pain, Sore Throat, Visual Disturbances and Yellow  Eyes. Respiratory Not Present- Bloody sputum, Chronic Cough, Difficulty Breathing, Snoring and Wheezing. Breast Not Present- Breast Mass, Breast Pain, Nipple Discharge and Skin Changes. Cardiovascular Not Present- Chest Pain, Difficulty Breathing Lying Down, Leg Cramps, Palpitations, Rapid Heart Rate, Shortness of Breath and Swelling of Extremities. Gastrointestinal Present- Hemorrhoids and Rectal Pain. Not Present- Abdominal Pain, Bloating, Bloody Stool, Change in Bowel Habits, Chronic diarrhea, Constipation, Difficulty Swallowing, Excessive gas, Gets full quickly at meals, Indigestion, Nausea and Vomiting. Female Genitourinary Not Present- Frequency, Nocturia, Painful Urination, Pelvic Pain and Urgency. Musculoskeletal Present- Muscle Weakness. Not Present- Back Pain, Joint Pain, Joint Stiffness, Muscle Pain and Swelling of Extremities. Neurological Present- Tingling and Weakness. Not Present- Decreased Memory, Fainting, Headaches, Numbness, Seizures, Tremor and Trouble walking. Psychiatric Not Present- Anxiety, Bipolar, Change in Sleep Pattern, Depression, Fearful and Frequent crying. Endocrine Not Present- Cold Intolerance, Excessive Hunger, Hair Changes, Heat Intolerance, Hot flashes and New Diabetes. Hematology Not Present- Easy Bruising, Excessive bleeding, Gland problems, HIV and Persistent Infections.   Vitals Elbert Ewings CMA; 01/03/2015 9:58 AM) 01/03/2015 9:58 AM Weight: 169 lb Height: 62in Body Surface Area: 1.83 m Body Mass Index: 30.91 kg/m Temp.: 97.24F(Temporal)  Pulse: 98 (Regular)  BP: 130/78 (Sitting, Left Arm, Standard)    Physical Exam Julie Klein MD; 01/03/2015 10:38 AM) General Mental Status-Alert. General Appearance-Consistent  with stated age. Hydration-Well hydrated. Voice-Normal.  Head and Neck Head-normocephalic, atraumatic with no lesions or palpable masses.  Eye Sclera/Conjunctiva - Bilateral-No scleral icterus.  Chest and Lung Exam Chest and lung exam reveals -quiet, even and easy respiratory effort with no use of accessory muscles. Inspection Chest Wall - Normal. Back - normal.  Breast Note: No palpable masses or lymphadenopathy. Scab at the location where her breast biopsies were on both breasts.   Cardiovascular Cardiovascular examination reveals -normal pedal pulses bilaterally. Note: regular rate and rhythm  Abdomen Inspection-Inspection Normal. Palpation/Percussion Palpation and Percussion of the abdomen reveal - Soft, Non Tender, No Rebound tenderness, No Rigidity (guarding) and No hepatosplenomegaly.  Peripheral Vascular Upper Extremity Inspection - Bilateral - Normal - No Clubbing, No Cyanosis, No Edema, Pulses Intact. Lower Extremity Palpation - Edema - Bilateral - No edema.  Neurologic Neurologic evaluation reveals -alert and oriented x 3 with no impairment of recent or remote memory. Mental Status-Normal.  Musculoskeletal Global Assessment -Note: no gross deformities.  Normal Exam - Left-Upper Extremity Strength Normal and Lower Extremity Strength Normal. Normal Exam - Right-Upper Extremity Strength Normal and Lower Extremity Strength Normal.  Lymphatic Head & Neck  General Head & Neck Lymphatics: Bilateral - Description - Normal. Axillary  General Axillary Region: Bilateral - Description - Normal. Tenderness - Non Tender.    Assessment & Plan Julie Klein MD; 01/03/2015 10:35 AM) CARCINOMA OF UPPER-INNER QUADRANT OF LEFT FEMALE BREAST (174.2  C50.212) Impression: We will plan a seed localized left lumpectomy wtih sentinel lymph node biopsy.  I discussed the risks of surgery including bleeding, infection, damage to adjacent structures,  pain, seroma, possible need for additional surgery or procedures, possibility of recurrent cancer.  Improving and she will end up having a pathologic complete response since she appears to have a dramatic radiographic response.  Her platelets will hopefully have recovered by 3.22 for surgery. we will check them the week before.  30 min spent in counseling. Current Plans  Schedule for Surgery Pt Education - [facility] breast cancer surgery: discussed with patient and provided information.   Signed by Julie Klein, MD (01/03/2015 10:39 AM)

## 2015-01-18 NOTE — Anesthesia Postprocedure Evaluation (Signed)
  Anesthesia Post-op Note  Patient: Julie [name]  Procedure(s) Performed: Procedure(s): RADIOACTIVE SEED GUIDED PARTIAL MASTECTOMY WITH AXILLARY SENTINEL LYMPH NODE BIOPSY (Left)  Patient Location: PACU  Anesthesia Type:GA combined with regional for post-op pain  Level of Consciousness: awake and alert   Airway and Oxygen Therapy: Patient Spontanous Breathing  Post-op Pain: none  Post-op Assessment: Post-op Vital signs reviewed  Post-op Vital Signs: Reviewed  Last Vitals:  Filed Vitals:   01/18/15 1600  BP: 144/77  Pulse: 65  Temp: 36.4 C  Resp: 20    Complications: No apparent anesthesia complications

## 2015-01-18 NOTE — Interval H&P Note (Signed)
History and Physical Interval Note:  01/18/2015 12:17 PM  Julie [name]  has presented today for surgery, with the diagnosis of Left Breast Cancer upper inner quadrant  The various methods of treatment have been discussed with the patient and family. After consideration of risks, benefits and other options for treatment, the patient has consented to  Procedure(s): RADIOACTIVE SEED GUIDED PARTIAL MASTECTOMY WITH AXILLARY SENTINEL LYMPH NODE BIOPSY (Left) as a surgical intervention .  The patient's history has been reviewed, patient examined, no change in status, stable for surgery.  I have reviewed the patient's chart and labs.  Questions were answered to the patient's satisfaction.     Galileo Colello

## 2015-01-18 NOTE — Progress Notes (Signed)
Assisted Dr. Oren Bracket with left, ultrasound guided, pectoralis block. Side rails up, monitors on throughout procedure. See vital signs in flow sheet. Tolerated Procedure well .Assisted nuc med tech [phone]  withnuc med inj. Side rails up, monitors on throughout procedure. See vital signs in flow sheet. Tolerated Procedure well.

## 2015-01-18 NOTE — Discharge Instructions (Addendum)
[facility] °Office Phone Number [phone] ° °BREAST BIOPSY/ PARTIAL MASTECTOMY: POST OP INSTRUCTIONS ° °Always review your discharge instruction sheet given to you by the facility where your surgery was performed. ° °IF YOU HAVE DISABILITY OR FAMILY LEAVE FORMS, YOU MUST BRING THEM TO THE OFFICE FOR PROCESSING.  DO NOT GIVE THEM TO YOUR DOCTOR. ° °1. A prescription for pain medication may be given to you upon discharge.  Take your pain medication as prescribed, if needed.  If narcotic pain medicine is not needed, then you may take acetaminophen (Tylenol) or ibuprofen (Advil) as needed. °2. Take your usually prescribed medications unless otherwise directed °3. If you need a refill on your pain medication, please contact your pharmacy.  They will contact our office to request authorization.  Prescriptions will not be filled after 5pm or on week-ends. °4. You should eat very light the first 24 hours after surgery, such as soup, crackers, pudding, etc.  Resume your normal diet the day after surgery. °5. Most patients will experience some swelling and bruising in the breast.  Ice packs and a good support bra will help.  Swelling and bruising can take several days to resolve.  °6. It is common to experience some constipation if taking pain medication after surgery.  Increasing fluid intake and taking a stool softener will usually help or prevent this problem from occurring.  A mild laxative (Milk of Magnesia or Miralax) should be taken according to package directions if there are no bowel movements after 48 hours. °7. Unless discharge instructions indicate otherwise, you may remove your bandages 48 hours after surgery, and you may shower at that time.  You may have steri-strips (small skin tapes) in place directly over the incision.  These strips should be left on the skin for 7-10 days.   Any sutures or staples will be removed at the office during your follow-up visit. °8. ACTIVITIES:  You may resume  regular daily activities (gradually increasing) beginning the next day.  Wearing a good support bra or sports bra (or the breast binder) minimizes pain and swelling.  You may have sexual intercourse when it is comfortable. °a. You may drive when you no longer are taking prescription pain medication, you can comfortably wear a seatbelt, and you can safely maneuver your car and apply brakes. °b. RETURN TO WORK:  __________1 week_______________ °9. You should see your doctor in the office for a follow-up appointment approximately two weeks after your surgery.  Your doctor’s nurse will typically make your follow-up appointment when she calls you with your pathology report.  Expect your pathology report 2-3 business days after your surgery.  You may call to check if you do not hear from us after three days. ° ° °WHEN TO CALL YOUR DOCTOR: °1. Fever over 101.0 °2. Nausea and/or vomiting. °3. Extreme swelling or bruising. °4. Continued bleeding from incision. °5. Increased pain, redness, or drainage from the incision. ° °The clinic staff is available to answer your questions during regular business hours.  Please don’t hesitate to call and ask to speak to one of the nurses for clinical concerns.  If you have a medical emergency, go to the nearest emergency room or call 911.  A surgeon from [facility] is always on call at the hospital. ° °For further questions, please visit [email or web address]  ° ° °Post Anesthesia Home Care Instructions ° °Activity: °Get plenty of rest for the remainder of the day. A responsible adult should stay with you for 24   hours following the procedure.  °For the next 24 hours, DO NOT: °-Drive a car °-Operate machinery °-Drink alcoholic beverages °-Take any medication unless instructed by your physician °-Make any legal decisions or sign important papers. ° °Meals: °Start with liquid foods such as gelatin or soup. Progress to regular foods as tolerated. Avoid greasy, spicy, heavy  foods. If nausea and/or vomiting occur, drink only clear liquids until the nausea and/or vomiting subsides. Call your physician if vomiting continues. ° °Special Instructions/Symptoms: °Your throat may feel dry or sore from the anesthesia or the breathing tube placed in your throat during surgery. If this causes discomfort, gargle with warm salt water. The discomfort should disappear within 24 hours. ° ° °

## 2015-01-18 NOTE — Op Note (Signed)
Left Breast Radioactive seed localized lumpectomy and sentinel lymph node biopsy  Indications: This patient presents with history of left breast cancer, s/p neoadjuvant chemotherapy  Pre-operative Diagnosis: [id number] left breast cancer  Post-operative Diagnosis: Same  Surgeon: [name]   Anesthesia: General endotracheal anesthesia  ASA Class: 2  Procedure Details  The patient was seen in the Holding Room. The risks, benefits, complications, treatment options, and expected outcomes were discussed with the patient. The possibilities of bleeding, infection, the need for additional procedures, failure to diagnose a condition, and creating a complication requiring transfusion or operation were discussed with the patient. The patient concurred with the proposed plan, giving informed consent.  The site of surgery properly noted/marked. The patient was taken to Operating Room # 6, identified, and the procedure verified as Left Breast Lumpectomy with SLN bx. A Time Out was held and the above information confirmed.  The Left arm, breast, and chest were prepped and draped in standard fashion. The lumpectomy was performed by creating an transverse incision over the upper inner quadrant of the breast over the previously placed radioactive seed.  Dissection was carried down to around the point of maximum signal intensity.  The posterior margin is pectoralis muscle. The cautery was used to perform the dissection.  Hemostasis was achieved with cautery. The edges of the cavity were marked with large clips, with one each medial, lateral, inferior and superior, and two clips posteriorly.   The specimen was inked with the margin marker paint kit.    Specimen radiography confirmed inclusion of the mammographic lesion, the clip, and the seed.  The background signal in the breast was zero.  The wound was irrigated and closed with 3-0 vicryl in layers and 4-0 monocryl subcuticular suture.    Using a hand-held gamma  probe, left axillary sentinel nodes were identified transcutaneously.  An oblique incision was created below the axillary hairline.  Dissection was carried through the clavipectoral fascia.  Four level 2 axillary sentinel nodes were removed.  Counts per second were 890, 1200, 360, and 200.    The background count was 19 cps.  The wound was irrigated.  Hemostasis was achieved with cautery.  The axillary incision was closed with a 3-0 vicryl deep dermal interrupted sutures and a 4-0 monocryl subcuticular closure.    Sterile dressings were applied. At the end of the operation, all sponge, instrument, and needle counts were correct.  Findings: grossly clear surgical margins and no adenopathy  Estimated Blood Loss:  min         Specimens: left breast lumpectomy and Four axillary sentinel lymph nodes.             Complications:  None; patient tolerated the procedure well.         Disposition: PACU - hemodynamically stable.         Condition: stable

## 2015-01-18 NOTE — Anesthesia Preprocedure Evaluation (Addendum)
Anesthesia Evaluation  Patient identified by MRN, date of birth, ID band Patient awake    Reviewed: Allergy & Precautions, NPO status , Patient's Chart, lab work & pertinent test results  History of Anesthesia Complications (+) PONV  Airway Mallampati: II  TM Distance: >3 FB Neck ROM: Full    Dental  (+) Teeth Intact, Dental Advisory Given   Pulmonary neg pulmonary ROS,  breath sounds clear to auscultation        Cardiovascular negative cardio ROS  Rhythm:Regular Rate:Normal     Neuro/Psych negative neurological ROS     GI/Hepatic negative GI ROS, Neg liver ROS,   Endo/Other  Morbid obesity  Renal/GU negative Renal ROS     Musculoskeletal   Abdominal   Peds  Hematology  (+) anemia ,   Anesthesia Other Findings   Reproductive/Obstetrics                            Anesthesia Physical Anesthesia Plan  ASA: II  Anesthesia Plan: General and Regional   Post-op Pain Management:    Induction: Intravenous  Airway Management Planned: LMA  Additional Equipment:   Intra-op Plan:   Post-operative Plan:   Informed Consent: I have reviewed the patients History and Physical, chart, labs and discussed the procedure including the risks, benefits and alternatives for the proposed anesthesia with the patient or authorized representative who has indicated his/her understanding and acceptance.   Dental advisory given  Plan Discussed with: CRNA  Anesthesia Plan Comments:         Anesthesia Quick Evaluation

## 2015-01-19 ENCOUNTER — Encounter (HOSPITAL_BASED_OUTPATIENT_CLINIC_OR_DEPARTMENT_OTHER): Payer: Self-pay | Admitting: General Surgery

## 2015-01-21 ENCOUNTER — Telehealth: Payer: Self-pay | Admitting: General Surgery

## 2015-01-21 NOTE — Telephone Encounter (Signed)
Discussed pathology with patient. Complete pathologic response.  No lymph nodes positive.

## 2015-01-25 ENCOUNTER — Ambulatory Visit (HOSPITAL_BASED_OUTPATIENT_CLINIC_OR_DEPARTMENT_OTHER): Payer: BLUE CROSS/BLUE SHIELD | Admitting: Hematology and Oncology

## 2015-01-25 ENCOUNTER — Other Ambulatory Visit (HOSPITAL_BASED_OUTPATIENT_CLINIC_OR_DEPARTMENT_OTHER): Payer: BLUE CROSS/BLUE SHIELD

## 2015-01-25 ENCOUNTER — Telehealth: Payer: Self-pay | Admitting: Hematology and Oncology

## 2015-01-25 ENCOUNTER — Ambulatory Visit (HOSPITAL_BASED_OUTPATIENT_CLINIC_OR_DEPARTMENT_OTHER): Payer: BLUE CROSS/BLUE SHIELD

## 2015-01-25 ENCOUNTER — Encounter: Payer: Self-pay | Admitting: Radiation Oncology

## 2015-01-25 VITALS — BP 136/80 | HR 85 | Temp 98.2°F | Resp 18 | Ht 62.0 in | Wt 165.4 lb

## 2015-01-25 DIAGNOSIS — Z5112 Encounter for antineoplastic immunotherapy: Secondary | ICD-10-CM

## 2015-01-25 DIAGNOSIS — C50212 Malignant neoplasm of upper-inner quadrant of left female breast: Secondary | ICD-10-CM

## 2015-01-25 DIAGNOSIS — R197 Diarrhea, unspecified: Secondary | ICD-10-CM

## 2015-01-25 DIAGNOSIS — C50912 Malignant neoplasm of unspecified site of left female breast: Secondary | ICD-10-CM

## 2015-01-25 DIAGNOSIS — D6481 Anemia due to antineoplastic chemotherapy: Secondary | ICD-10-CM

## 2015-01-25 DIAGNOSIS — R530 Neoplastic (malignant) related fatigue: Secondary | ICD-10-CM

## 2015-01-25 DIAGNOSIS — E876 Hypokalemia: Secondary | ICD-10-CM | POA: Diagnosis not present

## 2015-01-25 DIAGNOSIS — L658 Other specified nonscarring hair loss: Secondary | ICD-10-CM

## 2015-01-25 DIAGNOSIS — C50412 Malignant neoplasm of upper-outer quadrant of left female breast: Secondary | ICD-10-CM

## 2015-01-25 DIAGNOSIS — R070 Pain in throat: Secondary | ICD-10-CM

## 2015-01-25 LAB — CBC WITH DIFFERENTIAL/PLATELET
BASO%: 0.2 % (ref 0.0–2.0)
BASOS ABS: 0 10*3/uL (ref 0.0–0.1)
EOS%: 1.4 % (ref 0.0–7.0)
Eosinophils Absolute: 0.1 10*3/uL (ref 0.0–0.5)
HCT: 33.8 % — ABNORMAL LOW (ref 34.8–46.6)
HGB: 10.9 g/dL — ABNORMAL LOW (ref 11.6–15.9)
LYMPH#: 1 10*3/uL (ref 0.9–3.3)
LYMPH%: 23.5 % (ref 14.0–49.7)
MCH: 32.7 pg (ref 25.1–34.0)
MCHC: 32.4 g/dL (ref 31.5–36.0)
MCV: 101 fL (ref 79.5–101.0)
MONO#: 0.5 10*3/uL (ref 0.1–0.9)
MONO%: 10.4 % (ref 0.0–14.0)
NEUT%: 64.5 % (ref 38.4–76.8)
NEUTROS ABS: 2.9 10*3/uL (ref 1.5–6.5)
Platelets: 208 10*3/uL (ref 145–400)
RBC: 3.34 10*6/uL — ABNORMAL LOW (ref 3.70–5.45)
RDW: 20.2 % — AB (ref 11.2–14.5)
WBC: 4.4 10*3/uL (ref 3.9–10.3)

## 2015-01-25 LAB — COMPREHENSIVE METABOLIC PANEL (CC13)
ALBUMIN: 3.5 g/dL (ref 3.5–5.0)
ALK PHOS: 72 U/L (ref 40–150)
ALT: 32 U/L (ref 0–55)
ANION GAP: 8 meq/L (ref 3–11)
AST: 28 U/L (ref 5–34)
BILIRUBIN TOTAL: 0.36 mg/dL (ref 0.20–1.20)
BUN: 11.4 mg/dL (ref 7.0–26.0)
CHLORIDE: 108 meq/L (ref 98–109)
CO2: 27 meq/L (ref 22–29)
Calcium: 9.1 mg/dL (ref 8.4–10.4)
Creatinine: 0.8 mg/dL (ref 0.6–1.1)
EGFR: 80 mL/min/{1.73_m2} — ABNORMAL LOW (ref >90)
Glucose: 99 mg/dl (ref 70–140)
Potassium: 3.8 mEq/L (ref 3.5–5.1)
SODIUM: 142 meq/L (ref 136–145)
TOTAL PROTEIN: 6.2 g/dL — AB (ref 6.4–8.3)

## 2015-01-25 MED ORDER — HEPARIN SOD (PORK) LOCK FLUSH 100 UNIT/ML IV SOLN
500.0000 [IU] | Freq: Once | INTRAVENOUS | Status: AC | PRN
  Administered 2015-01-25: 500 [IU]
  Filled 2015-01-25: qty 5

## 2015-01-25 MED ORDER — SODIUM CHLORIDE 0.9 % IV SOLN
Freq: Once | INTRAVENOUS | Status: AC
  Administered 2015-01-25: 14:00:00 via INTRAVENOUS

## 2015-01-25 MED ORDER — DIPHENHYDRAMINE HCL 25 MG PO CAPS
50.0000 mg | ORAL_CAPSULE | Freq: Once | ORAL | Status: AC
  Administered 2015-01-25: 50 mg via ORAL

## 2015-01-25 MED ORDER — ACETAMINOPHEN 325 MG PO TABS
650.0000 mg | ORAL_TABLET | Freq: Once | ORAL | Status: AC
  Administered 2015-01-25: 650 mg via ORAL

## 2015-01-25 MED ORDER — DIPHENHYDRAMINE HCL 25 MG PO CAPS
ORAL_CAPSULE | ORAL | Status: AC
  Filled 2015-01-25: qty 2

## 2015-01-25 MED ORDER — ACETAMINOPHEN 325 MG PO TABS
ORAL_TABLET | ORAL | Status: AC
  Filled 2015-01-25: qty 2

## 2015-01-25 MED ORDER — SODIUM CHLORIDE 0.9 % IJ SOLN
10.0000 mL | INTRAMUSCULAR | Status: DC | PRN
  Administered 2015-01-25: 10 mL
  Filled 2015-01-25: qty 10

## 2015-01-25 MED ORDER — TRASTUZUMAB CHEMO INJECTION 440 MG
6.0000 mg/kg | Freq: Once | INTRAVENOUS | Status: AC
  Administered 2015-01-25: 462 mg via INTRAVENOUS
  Filled 2015-01-25: qty 22

## 2015-01-25 NOTE — Progress Notes (Signed)
Location of Breast Cancer: Left Breast   Upper Outer Quadrant, 10 o'clock position  Histology per Pathology Report:Diagnosis 08/24/14: Breast, right, needle core biopsy, UIQ- FIBROCYSTIC CHANGES WITH CALCIFICATIONS AND USUAL DUCTAL HYPERPLASIA.- PSEUDOANGIOMATOUS STROMAL HYPERPLASIA ([place]). - THERE IS NO EVIDENCE OF MALIGNANCY   08/05/2014: Left Breast,needle core bx,10 o'clock;=Invasive mammary Carcinoma  Receptor Status: ER(  Neg 0% ), PR ( Neg 0%  ), Her2-neu (  + ki=67 90%, )  Did patient present with symptoms (if so, please note symptoms) or was this found on screening mammography?: screening mammogram   Past/Anticipated interventions by surgeon, if any:  Diagnosis 01/18/2015: 1. Breast, lumpectomy, Left- NEGATIVE FOR IN SITU AND INVASIVE CARCINOMA, SEE COMMENT.- PREVIOUS BIOPSY SITE IDENTIFIED.- SURGICAL MARGINS, NEGATIVE FOR ATYPIA OR MALIGNANCY. - SEE TUMOR SYNOPTIC TEMPLATE BELOW. 2. Lymph node, sentinel, biopsy, left #1- ONE LYMPH NODE, NEGATIVE FOR TUMOR (0/1). 3. Lymph node, sentinel, biopsy, left #2- ONE LYMPH NODE, NEGATIVE FOR TUMOR (0/1). 4. Lymph node, sentinel, biopsy, left #3- ONE LYMPH NODE, NEGATIVE FOR TUMOR (0/1). 5. Lymph node, sentinel, biopsy, left #4- ONE LYMPH NODE, NEGATIVE FOR TUMOR (0/1).  Past/Anticipated interventions by medical oncology, if any: Chemotherapy : seen Breast clinic 08/11/14 ,Dr. Lucien Mons 08/27/2014 - 12/17/2014 Chemotherapy Neoadjuvant chemotherapy with Taxotere, carboplatin,( Herceptin, Perjeta every 3 weeks x6 cycles of Herceptin maintenance for one year.started 01/07/15 )         Lymphedema issues, if any: No   Pain issues, if any: Soreness   SAFETY ISSUES:  Prior radiation? No  Pacemaker/ICD? No  Possible current pregnancy?No, last menstrual period 2007.Had ablation by Dr.Haygood.  Is the patient on methotrexate? No  Current Complaints / other details:Married, G2P2,1st birth age 59,took HRT 6 months,and stopped, maternal  grandmother breast cancer,non smoker, alcohol yes,4/5 week  Allergies: Codeine,Sulfa antibiotics, and PCNS     McElroy, Felicita Gage, RN 01/25/2015,3:37 PM

## 2015-01-25 NOTE — Patient Instructions (Signed)
[facility] Discharge Instructions for Patients Receiving Chemotherapy  Today you received the following chemotherapy agents:  Herceptin  To help prevent nausea and vomiting after your treatment, we encourage you to take your nausea medication as prescribed.   If you develop nausea and vomiting that is not controlled by your nausea medication, call the clinic.   BELOW ARE SYMPTOMS THAT SHOULD BE REPORTED IMMEDIATELY:  *FEVER GREATER THAN 100.5 F  *CHILLS WITH OR WITHOUT FEVER  NAUSEA AND VOMITING THAT IS NOT CONTROLLED WITH YOUR NAUSEA MEDICATION  *UNUSUAL SHORTNESS OF BREATH  *UNUSUAL BRUISING OR BLEEDING  TENDERNESS IN MOUTH AND THROAT WITH OR WITHOUT PRESENCE OF ULCERS  *URINARY PROBLEMS  *BOWEL PROBLEMS  UNUSUAL RASH Items with * indicate a potential emergency and should be followed up as soon as possible.  Feel free to call the clinic you have any questions or concerns. The clinic phone number is [phone].  Please show the CHEMO ALERT CARD at check-in to the Emergency Department and triage nurse.   

## 2015-01-25 NOTE — Telephone Encounter (Signed)
Appointments made and avs  Will be printed for patient in chemo

## 2015-01-25 NOTE — Assessment & Plan Note (Signed)
Left breast invasive ductal carcinoma grade 3 ER 0% PR 0% HER-2 positive ratio 3.74, Ki-67 90%: 2.3 cm mass by ultrasound clinical stage: T2, N0, M0 stage II A. Status post left lumpectomy 01/16/2015: Negative for in situ or invasive breast cancer 0/4 lymph nodes, complete pathologic response  Chemotherapy summary: Completed 6 cycles of neoadjuvant chemotherapy with Taxotere, carboplatin, Herceptin and Perjeta every 3 weeks. Herceptin maintenance therapy started 01/07/2015 Toxicities of chemotherapy: Sore throat, alopecia, fatigue, chemotherapy induced anemia, hypokalemia, diarrhea related to Perjeta. She received IV fluids and hydration after 6 cycles of [place] Perjeta because of diarrhea and dehydration.  Pathology review: I discussed that she does not have any residual breast cancer in the lumpectomy specimen. This is an amazing result which signifies the superior prognosis. Patient and her family are very happy to hear this. Since she is ER/PR negative there is no role of adjuvant antiestrogen therapy.  Plan: 1. Continue maintenance Herceptin 2. Referral to radiation oncology for adjuvant radiation therapy  Return to clinic once every 6 weeks

## 2015-01-25 NOTE — Progress Notes (Signed)
Patient Care Team: Seward Carol, MD as PCP - General (Internal Medicine) [name], MD as Consulting Physician (Surgical Oncology) Thea Silversmith, MD as Consulting Physician (Radiation Oncology) Nicholas Lose, MD as Consulting Physician (Hematology and Oncology)  DIAGNOSIS: Breast cancer of upper-outer quadrant of left female breast   Staging form: Breast, AJCC 7th Edition     Clinical: Stage IIA (T2, N0, cM0) - Unsigned       Staging comments: Staged at breast conference 08/11/14.      Pathologic stage from 01/20/2015: Stage Unknown (yTX, N0, cM0) - Unsigned       Staging comments: Staged on final lumpectomy specimen by Dr. Donato Heinz    SUMMARY OF ONCOLOGIC HISTORY:   Breast cancer of upper-outer quadrant of left female breast   08/05/2014 Mammogram Left breast 10 o clock: 2.3X 2X1.4 cm lobular hypoechoeic   08/05/2014 Initial Biopsy Grade 3 IDC Er 0%, PR 0%, Her 2 positive Ratio 3.74; Ki 67: 90%   08/17/2014 Breast MRI Left breast known malignancy upper inner quadrant 2.6 cm: Suspicious non-mass enhancement upper-inner quadrant right breast biopsy pending   08/27/2014 - 12/17/2014 Chemotherapy Neoadjuvant chemotherapy with Taxotere, carboplatin, Herceptin, Perjeta every 3 weeks x6 cycles of Herceptin maintenance for one year.   12/27/2014 Breast MRI Significant treatment response. Previously biopsied mass is no longer visible, residual clumped linearly oriented non-mass enhancement anterior to the biopsied mass.    CHIEF COMPLIANT: Herceptin maintenance  INTERVAL HISTORY: Julie [name] is a 59 year old with above-mentioned history of left breast cancer treated with neoadjuvant chemotherapy and had a complete pathologic response. She is currently on maintenance Herceptin. She has an appointment to meet with radiation oncologist. She reports that her energy levels are improving. Her taste is not back yet. She had one episode of abdominal cramps and loose stools.  REVIEW OF SYSTEMS:    Constitutional: Denies fevers, chills or abnormal weight loss Eyes: Denies blurriness of vision Ears, nose, mouth, throat, and face: Denies mucositis or sore throat Respiratory: Denies cough, dyspnea or wheezes Cardiovascular: Denies palpitation, chest discomfort or lower extremity swelling Gastrointestinal:  Denies nausea, heartburn or change in bowel habits Skin: Denies abnormal skin rashes Lymphatics: Denies new lymphadenopathy or easy bruising Neurological:Denies numbness, tingling or new weaknesses complains of nails getting discolored from chemotherapy Behavioral/Psych: Mood is stable, no new changes  Breast:  denies any pain or lumps or nodules in either breasts All other systems were reviewed with the patient and are negative.  I have reviewed the past medical history, past surgical history, social history and family history with the patient and they are unchanged from previous note.  ALLERGIES:  is allergic to codeine; sulfa antibiotics; and penicillins.  MEDICATIONS:  Current Outpatient Prescriptions  Medication Sig Dispense Refill  . buPROPion (WELLBUTRIN SR) 150 MG 12 hr tablet Take 150 mg by mouth every evening.     . cetirizine (ZYRTEC) 10 MG tablet Take 10 mg by mouth daily.    . cholestyramine (QUESTRAN) 4 G packet Take 1 packet (4 g total) by mouth 3 (three) times daily with meals. 60 each 3  . esomeprazole (NEXIUM) 40 MG capsule Take 1 capsule (40 mg total) by mouth daily at 12 noon. 30 capsule 3  . fluticasone (FLONASE) 50 MCG/ACT nasal spray Place into both nostrils as needed for allergies or rhinitis.    . furosemide (LASIX) 20 MG tablet Take 1 tablet (20 mg total) by mouth daily as needed for edema. 14 tablet 0  . loperamide (IMODIUM) 2 MG  capsule Take 1 capsule (2 mg total) by mouth as needed for diarrhea or loose stools. 30 capsule 2  . LORazepam (ATIVAN) 0.5 MG tablet Take 1 tablet (0.5 mg total) by mouth every 6 (six) hours as needed (Nausea or vomiting). 30  tablet 0  . ondansetron (ZOFRAN) 8 MG tablet Take 1 tablet (8 mg total) by mouth 2 (two) times daily. Start the day after chemo for 3 days. Then take as needed for nausea or vomiting. 30 tablet 1  . potassium chloride SA (K-DUR,KLOR-CON) 20 MEQ tablet Take 1 tablet (20 mEq total) by mouth daily. Take potassium only if taking Lasix. 14 tablet 0  . prochlorperazine (COMPAZINE) 10 MG tablet TAKE 1 TABLET EVERY 6 HOURS AS NEEDED FOR NAUSEA & VOMITING. 30 tablet 0  . simvastatin (ZOCOR) 20 MG tablet Take 20 mg by mouth daily at 6 PM.     . traMADol (ULTRAM) 50 MG tablet Take 1-2 tablets (50-100 mg total) by mouth every 6 (six) hours as needed for moderate pain or severe pain. 30 tablet 0   No current facility-administered medications for this visit.   Facility-Administered Medications Ordered in Other Visits  Medication Dose Route Frequency Provider Last Rate Last Dose  . Influenza vac split quadrivalent PF (FLUARIX) injection 0.5 mL  0.5 mL Intramuscular Tomorrow-1000 Nicholas Lose, MD        PHYSICAL EXAMINATION: ECOG PERFORMANCE STATUS: 1 - Symptomatic but completely ambulatory  Filed Vitals:   01/25/15 1132  BP: 136/80  Pulse: 85  Temp: 98.2 F (36.8 C)  Resp: 18   Filed Weights   01/25/15 1132  Weight: 165 lb 6.4 oz (75.025 kg)    GENERAL:alert, no distress and comfortable SKIN: skin color, texture, turgor are normal, no rashes or significant lesions EYES: normal, Conjunctiva are pink and non-injected, sclera clear OROPHARYNX:no exudate, no erythema and lips, buccal mucosa, and tongue normal  NECK: supple, thyroid normal size, non-tender, without nodularity LYMPH:  no palpable lymphadenopathy in the cervical, axillary or inguinal LUNGS: clear to auscultation and percussion with normal breathing effort HEART: regular rate & rhythm and no murmurs and no lower extremity edema ABDOMEN:abdomen soft, non-tender and normal bowel sounds Musculoskeletal:no cyanosis of digits and no  clubbing  NEURO: alert & oriented x 3 with fluent speech, no focal motor/sensory deficits  LABORATORY DATA:  I have reviewed the data as listed   Chemistry      Component Value Date/Time   NA 142 01/25/2015 1122   NA 142 01/17/2015 1115   K 3.8 01/25/2015 1122   K 3.9 01/17/2015 1115   CL 105 01/17/2015 1115   CO2 27 01/25/2015 1122   CO2 29 01/17/2015 1115   BUN 11.4 01/25/2015 1122   BUN <5* 01/17/2015 1115   CREATININE 0.8 01/25/2015 1122   CREATININE 0.85 01/17/2015 1115      Component Value Date/Time   CALCIUM 9.1 01/25/2015 1122   CALCIUM 7.5* 01/17/2015 1115   ALKPHOS 72 01/25/2015 1122   AST 28 01/25/2015 1122   ALT 32 01/25/2015 1122   BILITOT 0.36 01/25/2015 1122       Lab Results  Component Value Date   WBC 4.4 01/25/2015   HGB 10.9* 01/25/2015   HCT 33.8* 01/25/2015   MCV 101.0 01/25/2015   PLT 208 01/25/2015   NEUTROABS 2.9 01/25/2015   ASSESSMENT & PLAN:  Breast cancer of upper-outer quadrant of left female breast Left breast invasive ductal carcinoma grade 3 ER 0% PR 0% HER-2 positive ratio  3.74, Ki-67 90%: 2.3 cm mass by ultrasound clinical stage: T2, N0, M0 stage II A. Status post left lumpectomy 01/16/2015: Negative for in situ or invasive breast cancer 0/4 lymph nodes, complete pathologic response  Chemotherapy summary: Completed 6 cycles of neoadjuvant chemotherapy with Taxotere, carboplatin, Herceptin and Perjeta every 3 weeks. Herceptin maintenance therapy started 01/07/2015 Toxicities of chemotherapy: Sore throat, alopecia, fatigue, chemotherapy induced anemia, hypokalemia, diarrhea related to Perjeta. She received IV fluids and hydration after 6 cycles of [place] Perjeta because of diarrhea and dehydration.  Pathology review: I discussed that she does not have any residual breast cancer in the lumpectomy specimen. This is an amazing result which signifies the superior prognosis. Patient and her family are very happy to hear this. Since she is ER/PR  negative there is no role of adjuvant antiestrogen therapy.  Plan: 1. Continue maintenance Herceptin 2. Referral to radiation oncology for adjuvant radiation therapy  Return to clinic once every 6 weeks      No orders of the defined types were placed in this encounter.   The patient has a good understanding of the overall plan. she agrees with it. She will call with any problems that may develop before her next visit here.   Rulon Eisenmenger, MD

## 2015-01-27 ENCOUNTER — Ambulatory Visit
Admission: RE | Admit: 2015-01-27 | Discharge: 2015-01-27 | Disposition: A | Discharge status: Home / Self Care | Payer: BLUE CROSS/BLUE SHIELD | Source: Ambulatory Visit | Attending: Radiation Oncology | Admitting: Radiation Oncology

## 2015-01-27 ENCOUNTER — Encounter: Payer: Self-pay | Admitting: Radiation Oncology

## 2015-01-27 VITALS — BP 118/78 | HR 91 | Temp 98.6°F | Wt 166.7 lb

## 2015-01-27 DIAGNOSIS — C50412 Malignant neoplasm of upper-outer quadrant of left female breast: Secondary | ICD-10-CM | POA: Diagnosis not present

## 2015-01-27 HISTORY — DX: Allergy, unspecified, initial encounter: T78.40XA

## 2015-01-27 HISTORY — DX: Malignant neoplasm of unspecified site of unspecified female breast: C50.919

## 2015-01-27 NOTE — Progress Notes (Signed)
Please see the Nurse Progress Note in the MD Initial Consult Encounter for this patient. 

## 2015-01-27 NOTE — Progress Notes (Signed)
   Department of Radiation Oncology  Phone:  [phone] Fax:        [phone]   Name: Julie [name] MRN: [id number]  DOB: October 06, 1956  Date: 01/27/2015  Follow Up Visit Note  Diagnosis: Breast cancer of upper-outer quadrant of left female breast   Staging form: Breast, AJCC 7th Edition     Clinical: Stage IIA (T2, N0, cM0) - Unsigned       Staging comments: Staged at breast conference 08/11/14.      Pathologic stage from 01/20/2015: Stage Unknown (yTX, N0, cM0) - Signed by Enid Cutter, MD on 01/25/2015       Staging comments: Staged on final lumpectomy specimen by Dr. Donato Heinz  Interval History: Julie [name] presents today for routine followup.  She did well with chemotherapy although the last cycle "did a number on me." She had her lumpectomy on 3/27 which revealed no viable tumor left or a pathologic complete response. Lymph nodes were also negative. She is alone today. She is excited about her daughter's graduation from [place]. She and her husband are going up there May 18. And she would like to start radiation when she returns. She continues on Herceptin for now.   Physical Exam:  Filed Vitals:   01/27/15 1116  BP: 118/78  Pulse: 91  Temp: 98.6 F (37 C)  Weight: 166 lb 11.2 oz (75.615 kg)   Pleasant female. No distress.   IMPRESSION: Julie [name] is a 59 y.o. female s/p lumpectomy with a pathologic complete response  PLAN:  We discussed the role of radiation and decreasing local failures in patients who undergo lumpectomy. We discussed the retrospective data showing an increase in failure rates in patients who have a pathologic complete response and did not undergo radiation. For this reason I have recommended radiation to the whole breast followed by boost to the tumor bed. We discussed the process of simulation the placement tattoos. We discussed possible side effects during treatment including but not limited to skin irritation darkness and fatigue. We discussed long-term  effects of treatment which are extremely unlikely but possible including damage to the lungs and ribs. We discussed the low likelihood of secondary malignancies. I let her know that nursing would be doing skin teaching with her.   She has signed informed consent and we will get her scheduled for May 17 for sim.      Thea Silversmith, MD

## 2015-01-28 ENCOUNTER — Ambulatory Visit: Payer: BLUE CROSS/BLUE SHIELD

## 2015-02-17 ENCOUNTER — Telehealth: Payer: Self-pay | Admitting: *Deleted

## 2015-02-17 ENCOUNTER — Other Ambulatory Visit: Payer: Self-pay | Admitting: *Deleted

## 2015-02-17 DIAGNOSIS — C50412 Malignant neoplasm of upper-outer quadrant of left female breast: Secondary | ICD-10-CM

## 2015-02-17 MED ORDER — DOXYCYCLINE HYCLATE 100 MG PO TABS: ORAL_TABLET | ORAL | Status: DC

## 2015-02-17 NOTE — Telephone Encounter (Signed)
Received call from patient stating she thinks her toe is infected.  She states it is painful and there is pus coming out.  Per Dr. Lindi Adie she can start on antibiotics and see Cyndee tomorrow when she comes in for her Herceptin.Marland Kitchen She was not able to come in today.  Prescription for Doxycycline sent to her pharmacy.

## 2015-02-17 NOTE — Telephone Encounter (Signed)
Left message for a return phone call.  Patient had left message and I was returning her call.

## 2015-02-18 ENCOUNTER — Ambulatory Visit (HOSPITAL_BASED_OUTPATIENT_CLINIC_OR_DEPARTMENT_OTHER): Payer: Self-pay | Admitting: Nurse Practitioner

## 2015-02-18 ENCOUNTER — Ambulatory Visit (HOSPITAL_BASED_OUTPATIENT_CLINIC_OR_DEPARTMENT_OTHER): Payer: BLUE CROSS/BLUE SHIELD

## 2015-02-18 ENCOUNTER — Telehealth: Payer: Self-pay | Admitting: Nurse Practitioner

## 2015-02-18 ENCOUNTER — Ambulatory Visit (HOSPITAL_COMMUNITY): Payer: BLUE CROSS/BLUE SHIELD

## 2015-02-18 ENCOUNTER — Telehealth: Payer: Self-pay | Admitting: *Deleted

## 2015-02-18 ENCOUNTER — Other Ambulatory Visit: Payer: Self-pay

## 2015-02-18 ENCOUNTER — Other Ambulatory Visit (HOSPITAL_BASED_OUTPATIENT_CLINIC_OR_DEPARTMENT_OTHER): Payer: BLUE CROSS/BLUE SHIELD

## 2015-02-18 VITALS — BP 133/78 | HR 84 | Temp 98.1°F | Resp 20

## 2015-02-18 DIAGNOSIS — C50412 Malignant neoplasm of upper-outer quadrant of left female breast: Secondary | ICD-10-CM

## 2015-02-18 DIAGNOSIS — Z5112 Encounter for antineoplastic immunotherapy: Secondary | ICD-10-CM

## 2015-02-18 DIAGNOSIS — C50212 Malignant neoplasm of upper-inner quadrant of left female breast: Secondary | ICD-10-CM

## 2015-02-18 LAB — CBC WITH DIFFERENTIAL/PLATELET
BASO%: 0.6 % (ref 0.0–2.0)
Basophils Absolute: 0 10*3/uL (ref 0.0–0.1)
EOS%: 6.1 % (ref 0.0–7.0)
Eosinophils Absolute: 0.2 10*3/uL (ref 0.0–0.5)
HCT: 32.7 % — ABNORMAL LOW (ref 34.8–46.6)
HEMOGLOBIN: 11.1 g/dL — AB (ref 11.6–15.9)
LYMPH%: 31.9 % (ref 14.0–49.7)
MCH: 33.3 pg (ref 25.1–34.0)
MCHC: 33.9 g/dL (ref 31.5–36.0)
MCV: 98.3 fL (ref 79.5–101.0)
MONO#: 0.3 10*3/uL (ref 0.1–0.9)
MONO%: 8.8 % (ref 0.0–14.0)
NEUT#: 1.9 10*3/uL (ref 1.5–6.5)
NEUT%: 52.6 % (ref 38.4–76.8)
Platelets: 192 10*3/uL (ref 145–400)
RBC: 3.33 10*6/uL — ABNORMAL LOW (ref 3.70–5.45)
RDW: 17.1 % — AB (ref 11.2–14.5)
WBC: 3.7 10*3/uL — ABNORMAL LOW (ref 3.9–10.3)
lymph#: 1.2 10*3/uL (ref 0.9–3.3)

## 2015-02-18 LAB — COMPREHENSIVE METABOLIC PANEL (CC13)
ALT: 15 U/L (ref 0–55)
AST: 17 U/L (ref 5–34)
Albumin: 3.5 g/dL (ref 3.5–5.0)
Alkaline Phosphatase: 103 U/L (ref 40–150)
Anion Gap: 13 mEq/L — ABNORMAL HIGH (ref 3–11)
BUN: 14.3 mg/dL (ref 7.0–26.0)
CHLORIDE: 108 meq/L (ref 98–109)
CO2: 23 mEq/L (ref 22–29)
Calcium: 9.5 mg/dL (ref 8.4–10.4)
Creatinine: 0.8 mg/dL (ref 0.6–1.1)
EGFR: 83 mL/min/{1.73_m2} — AB (ref >90)
GLUCOSE: 83 mg/dL (ref 70–140)
POTASSIUM: 3.4 meq/L — AB (ref 3.5–5.1)
Sodium: 144 mEq/L (ref 136–145)
TOTAL PROTEIN: 6.4 g/dL (ref 6.4–8.3)
Total Bilirubin: 0.32 mg/dL (ref 0.20–1.20)

## 2015-02-18 LAB — HOLD TUBE, BLOOD BANK

## 2015-02-18 MED ORDER — ACETAMINOPHEN 325 MG PO TABS
650.0000 mg | ORAL_TABLET | Freq: Once | ORAL | Status: AC
  Administered 2015-02-18: 650 mg via ORAL

## 2015-02-18 MED ORDER — HEPARIN SOD (PORK) LOCK FLUSH 100 UNIT/ML IV SOLN: 250.0000 [IU] | INTRAVENOUS | Status: DC | PRN

## 2015-02-18 MED ORDER — DIPHENHYDRAMINE HCL 25 MG PO CAPS
50.0000 mg | ORAL_CAPSULE | Freq: Once | ORAL | Status: AC
  Administered 2015-02-18: 50 mg via ORAL

## 2015-02-18 MED ORDER — TRASTUZUMAB CHEMO INJECTION 440 MG
6.0000 mg/kg | Freq: Once | INTRAVENOUS | Status: AC
  Administered 2015-02-18: 462 mg via INTRAVENOUS
  Filled 2015-02-18: qty 22

## 2015-02-18 MED ORDER — DIPHENHYDRAMINE HCL 25 MG PO CAPS
ORAL_CAPSULE | ORAL | Status: AC
  Filled 2015-02-18: qty 2

## 2015-02-18 MED ORDER — SODIUM CHLORIDE 0.9 % IV SOLN
Freq: Once | INTRAVENOUS | Status: AC
  Administered 2015-02-18: 09:00:00 via INTRAVENOUS

## 2015-02-18 MED ORDER — ACETAMINOPHEN 325 MG PO TABS
ORAL_TABLET | ORAL | Status: AC
  Filled 2015-02-18: qty 2

## 2015-02-18 NOTE — Telephone Encounter (Signed)
Spoke to pt in treatment room- Pt toe nail is improving with antibiotic. Pt has been advised to call PCP to follow up on nail infection.  Pt understands to also call this office if any further concerns.

## 2015-02-18 NOTE — Patient Instructions (Signed)
[facility] Discharge Instructions for Patients Receiving Chemotherapy  Today you received the following chemotherapy agents:  Herceptin  To help prevent nausea and vomiting after your treatment, we encourage you to take your nausea medication as prescribed.   If you develop nausea and vomiting that is not controlled by your nausea medication, call the clinic.   BELOW ARE SYMPTOMS THAT SHOULD BE REPORTED IMMEDIATELY:  *FEVER GREATER THAN 100.5 F  *CHILLS WITH OR WITHOUT FEVER  NAUSEA AND VOMITING THAT IS NOT CONTROLLED WITH YOUR NAUSEA MEDICATION  *UNUSUAL SHORTNESS OF BREATH  *UNUSUAL BRUISING OR BLEEDING  TENDERNESS IN MOUTH AND THROAT WITH OR WITHOUT PRESENCE OF ULCERS  *URINARY PROBLEMS  *BOWEL PROBLEMS  UNUSUAL RASH Items with * indicate a potential emergency and should be followed up as soon as possible.  Feel free to call the clinic you have any questions or concerns. The clinic phone number is [phone].  Please show the CHEMO ALERT CARD at check-in to the Emergency Department and triage nurse.   

## 2015-02-18 NOTE — Telephone Encounter (Signed)
Julie [name] with condy bacon request that we cancel todays appointments with cindy

## 2015-03-07 ENCOUNTER — Other Ambulatory Visit: Payer: Self-pay | Admitting: Hematology and Oncology

## 2015-03-07 DIAGNOSIS — C50412 Malignant neoplasm of upper-outer quadrant of left female breast: Secondary | ICD-10-CM

## 2015-03-08 ENCOUNTER — Ambulatory Visit (HOSPITAL_BASED_OUTPATIENT_CLINIC_OR_DEPARTMENT_OTHER): Payer: BLUE CROSS/BLUE SHIELD | Admitting: Hematology and Oncology

## 2015-03-08 ENCOUNTER — Telehealth: Payer: Self-pay | Admitting: Hematology and Oncology

## 2015-03-08 ENCOUNTER — Other Ambulatory Visit (HOSPITAL_BASED_OUTPATIENT_CLINIC_OR_DEPARTMENT_OTHER): Payer: BLUE CROSS/BLUE SHIELD

## 2015-03-08 ENCOUNTER — Other Ambulatory Visit: Payer: Self-pay | Admitting: *Deleted

## 2015-03-08 ENCOUNTER — Ambulatory Visit (HOSPITAL_BASED_OUTPATIENT_CLINIC_OR_DEPARTMENT_OTHER): Payer: BLUE CROSS/BLUE SHIELD

## 2015-03-08 VITALS — BP 143/72 | HR 87 | Temp 98.7°F | Resp 18 | Ht 62.0 in | Wt 157.8 lb

## 2015-03-08 DIAGNOSIS — Z5112 Encounter for antineoplastic immunotherapy: Secondary | ICD-10-CM

## 2015-03-08 DIAGNOSIS — D6481 Anemia due to antineoplastic chemotherapy: Secondary | ICD-10-CM

## 2015-03-08 DIAGNOSIS — C50412 Malignant neoplasm of upper-outer quadrant of left female breast: Secondary | ICD-10-CM

## 2015-03-08 DIAGNOSIS — R197 Diarrhea, unspecified: Secondary | ICD-10-CM | POA: Diagnosis not present

## 2015-03-08 DIAGNOSIS — R5383 Other fatigue: Secondary | ICD-10-CM

## 2015-03-08 DIAGNOSIS — J029 Acute pharyngitis, unspecified: Secondary | ICD-10-CM

## 2015-03-08 DIAGNOSIS — E876 Hypokalemia: Secondary | ICD-10-CM

## 2015-03-08 LAB — CBC WITH DIFFERENTIAL/PLATELET
BASO%: 0.4 % (ref 0.0–2.0)
Basophils Absolute: 0 10*3/uL (ref 0.0–0.1)
EOS%: 4.1 % (ref 0.0–7.0)
Eosinophils Absolute: 0.2 10*3/uL (ref 0.0–0.5)
HCT: 34.2 % — ABNORMAL LOW (ref 34.8–46.6)
HGB: 11.5 g/dL — ABNORMAL LOW (ref 11.6–15.9)
LYMPH%: 34.9 % (ref 14.0–49.7)
MCH: 32.8 pg (ref 25.1–34.0)
MCHC: 33.5 g/dL (ref 31.5–36.0)
MCV: 97.8 fL (ref 79.5–101.0)
MONO#: 0.3 10*3/uL (ref 0.1–0.9)
MONO%: 7.3 % (ref 0.0–14.0)
NEUT#: 2.1 10*3/uL (ref 1.5–6.5)
NEUT%: 53.3 % (ref 38.4–76.8)
Platelets: 213 10*3/uL (ref 145–400)
RBC: 3.5 10*6/uL — ABNORMAL LOW (ref 3.70–5.45)
RDW: 15.6 % — ABNORMAL HIGH (ref 11.2–14.5)
WBC: 3.9 10*3/uL (ref 3.9–10.3)
lymph#: 1.4 10*3/uL (ref 0.9–3.3)

## 2015-03-08 MED ORDER — TRASTUZUMAB CHEMO INJECTION 440 MG
4.0000 mg/kg | Freq: Once | INTRAVENOUS | Status: AC
  Administered 2015-03-08: 294 mg via INTRAVENOUS
  Filled 2015-03-08: qty 14

## 2015-03-08 MED ORDER — SODIUM CHLORIDE 0.9 % IV SOLN
Freq: Once | INTRAVENOUS | Status: AC
  Administered 2015-03-08: 11:00:00 via INTRAVENOUS

## 2015-03-08 MED ORDER — HEPARIN SOD (PORK) LOCK FLUSH 100 UNIT/ML IV SOLN
500.0000 [IU] | Freq: Once | INTRAVENOUS | Status: AC | PRN
  Administered 2015-03-08: 500 [IU]
  Filled 2015-03-08: qty 5

## 2015-03-08 MED ORDER — DIPHENHYDRAMINE HCL 25 MG PO CAPS
ORAL_CAPSULE | ORAL | Status: AC
  Filled 2015-03-08: qty 2

## 2015-03-08 MED ORDER — DIPHENHYDRAMINE HCL 25 MG PO CAPS
50.0000 mg | ORAL_CAPSULE | Freq: Once | ORAL | Status: AC
  Administered 2015-03-08: 50 mg via ORAL

## 2015-03-08 MED ORDER — ACETAMINOPHEN 325 MG PO TABS
650.0000 mg | ORAL_TABLET | Freq: Once | ORAL | Status: AC
  Administered 2015-03-08: 650 mg via ORAL

## 2015-03-08 MED ORDER — ACETAMINOPHEN 325 MG PO TABS
ORAL_TABLET | ORAL | Status: AC
  Filled 2015-03-08: qty 2

## 2015-03-08 MED ORDER — SODIUM CHLORIDE 0.9 % IJ SOLN
10.0000 mL | INTRAMUSCULAR | Status: DC | PRN
  Administered 2015-03-08: 10 mL
  Filled 2015-03-08: qty 10

## 2015-03-08 NOTE — Assessment & Plan Note (Signed)
Left breast invasive ductal carcinoma grade 3 ER 0% PR 0% HER-2 positive ratio 3.74, Ki-67 90%: 2.3 cm mass by ultrasound clinical stage: T2, N0, M0 stage II A. Status post left lumpectomy 01/16/2015: Negative for in situ or invasive breast cancer 0/4 lymph nodes, complete pathologic response  Chemotherapy summary: Completed 6 cycles of neoadjuvant chemotherapy with Taxotere, carboplatin, Herceptin and Perjeta every 3 weeks. Herceptin maintenance therapy started 01/07/2015 Toxicities of chemotherapy: Sore throat, alopecia, fatigue, chemotherapy induced anemia, hypokalemia, diarrhea related to Perjeta. She received IV fluids and hydration after 6 cycles of [place] Perjeta because of diarrhea and dehydration.  Plan: 1. Continue maintenance Herceptin 2. Adjuvant radiation therapy:  CT simulation May 17  Return to clinic once every 6 weeks

## 2015-03-08 NOTE — Telephone Encounter (Signed)
Confirmed echo for May

## 2015-03-08 NOTE — Progress Notes (Signed)
Patient Care Team: Seward Carol, MD as PCP - General (Internal Medicine) [name], MD as Consulting Physician (Surgical Oncology) Thea Silversmith, MD as Consulting Physician (Radiation Oncology) Nicholas Lose, MD as Consulting Physician (Hematology and Oncology)  DIAGNOSIS: Breast cancer of upper-outer quadrant of left female breast   Staging form: Breast, AJCC 7th Edition     Clinical: Stage IIA (T2, N0, cM0) - Unsigned       Staging comments: Staged at breast conference 08/11/14.      Pathologic stage from 01/20/2015: Stage Unknown (yTX, N0, cM0) - Signed by Enid Cutter, MD on 01/25/2015       Staging comments: Staged on final lumpectomy specimen by Dr. Donato Heinz    SUMMARY OF ONCOLOGIC HISTORY:   Breast cancer of upper-outer quadrant of left female breast   08/05/2014 Mammogram Left breast 10 o clock: 2.3X 2X1.4 cm lobular hypoechoeic   08/05/2014 Initial Biopsy Grade 3 IDC Er 0%, PR 0%, Her 2 positive Ratio 3.74; Ki 67: 90%   08/17/2014 Breast MRI Left breast known malignancy upper inner quadrant 2.6 cm: Suspicious non-mass enhancement upper-inner quadrant right breast biopsy pending   08/27/2014 - 12/17/2014 Chemotherapy Neoadjuvant chemotherapy with Taxotere, carboplatin, Herceptin, Perjeta every 3 weeks x6 cycles of Herceptin maintenance for one year.   12/27/2014 Breast MRI Significant treatment response. Previously biopsied mass is no longer visible, residual clumped linearly oriented non-mass enhancement anterior to the biopsied mass.   01/18/2015 Surgery  left breast lumpectomy: negative for in situ and invasive cancer complete pathologic response    CHIEF COMPLIANT:  On Herceptin maintenance gets diarrhea  INTERVAL HISTORY: Julie [name] is a  59 year old with above-mentioned history of left breast cancer treated with neoadjuvant chemotherapy. Lumpectomy showed a complete pathologic response. She is currently on Herceptin maintenance. She is tolerating it extremely well. He does  have diarrhea once or twice a week. It is accompanied by abdominal cramps. She has passed or more month for her daughter's graduation is very excited about it.  REVIEW OF SYSTEMS:   Constitutional: Denies fevers, chills or abnormal weight loss Eyes: Denies blurriness of vision Ears, nose, mouth, throat, and face: Denies mucositis or sore throat Respiratory: Denies cough, dyspnea or wheezes Cardiovascular: Denies palpitation, chest discomfort or lower extremity swelling Gastrointestinal:  Denies nausea, heartburn or change in bowel habits Skin: Denies abnormal skin rashes Lymphatics: Denies new lymphadenopathy or easy bruising Neurological:Denies numbness, tingling or new weaknesses Behavioral/Psych: Mood is stable, no new changes  Breast:  denies any pain or lumps or nodules in either breasts All other systems were reviewed with the patient and are negative.  I have reviewed the past medical history, past surgical history, social history and family history with the patient and they are unchanged from previous note.  ALLERGIES:  is allergic to codeine; sulfa antibiotics; and penicillins.  MEDICATIONS:  Current Outpatient Prescriptions  Medication Sig Dispense Refill  . acetaminophen (TYLENOL) 500 MG tablet Take 500 mg by mouth every 6 (six) hours as needed.    Marland Kitchen buPROPion (WELLBUTRIN SR) 150 MG 12 hr tablet Take 150 mg by mouth every evening.     . cetirizine (ZYRTEC) 10 MG tablet Take 10 mg by mouth daily.    . cholestyramine (QUESTRAN) 4 G packet Take 1 packet (4 g total) by mouth 3 (three) times daily with meals. 60 each 3  . esomeprazole (NEXIUM) 40 MG capsule Take 1 capsule (40 mg total) by mouth daily at 12 noon. 30 capsule 3  . fluticasone (FLONASE)  50 MCG/ACT nasal spray Place into both nostrils as needed for allergies or rhinitis.    Marland Kitchen loperamide (IMODIUM) 2 MG capsule Take 1 capsule (2 mg total) by mouth as needed for diarrhea or loose stools. 30 capsule 2  . prochlorperazine  (COMPAZINE) 10 MG tablet TAKE 1 TABLET EVERY 6 HOURS AS NEEDED FOR NAUSEA & VOMITING. 30 tablet 0  . simvastatin (ZOCOR) 20 MG tablet Take 20 mg by mouth daily at 6 PM.     . SOOLANTRA 1 % CREA   0  . LORazepam (ATIVAN) 0.5 MG tablet Take 1 tablet (0.5 mg total) by mouth every 6 (six) hours as needed (Nausea or vomiting). (Patient not taking: Reported on 01/27/2015) 30 tablet 0  . ondansetron (ZOFRAN) 8 MG tablet Take 1 tablet (8 mg total) by mouth 2 (two) times daily. Start the day after chemo for 3 days. Then take as needed for nausea or vomiting. (Patient not taking: Reported on 03/08/2015) 30 tablet 1   No current facility-administered medications for this visit.   Facility-Administered Medications Ordered in Other Visits  Medication Dose Route Frequency Provider Last Rate Last Dose  . heparin lock flush 100 unit/mL  250 Units Intracatheter PRN Susanne Borders, NP      . Influenza vac split quadrivalent PF (FLUARIX) injection 0.5 mL  0.5 mL Intramuscular Tomorrow-1000 Nicholas Lose, MD        PHYSICAL EXAMINATION: ECOG PERFORMANCE STATUS: 0 - Asymptomatic  Filed Vitals:   03/08/15 1007  BP: 143/72  Pulse: 87  Temp: 98.7 F (37.1 C)  Resp: 18   Filed Weights   03/08/15 1007  Weight: 157 lb 12.8 oz (71.578 kg)    GENERAL:alert, no distress and comfortable SKIN: skin color, texture, turgor are normal, no rashes or significant lesions EYES: normal, Conjunctiva are pink and non-injected, sclera clear OROPHARYNX:no exudate, no erythema and lips, buccal mucosa, and tongue normal  NECK: supple, thyroid normal size, non-tender, without nodularity LYMPH:  no palpable lymphadenopathy in the cervical, axillary or inguinal LUNGS: clear to auscultation and percussion with normal breathing effort HEART: regular rate & rhythm and no murmurs and no lower extremity edema ABDOMEN:abdomen soft, non-tender and normal bowel sounds Musculoskeletal:no cyanosis of digits and no clubbing  NEURO: alert  & oriented x 3 with fluent speech, no focal motor/sensory deficits  LABORATORY DATA:  I have reviewed the data as listed   Chemistry      Component Value Date/Time   NA 144 02/18/2015 0914   NA 142 01/17/2015 1115   K 3.4* 02/18/2015 0914   K 3.9 01/17/2015 1115   CL 105 01/17/2015 1115   CO2 23 02/18/2015 0914   CO2 29 01/17/2015 1115   BUN 14.3 02/18/2015 0914   BUN <5* 01/17/2015 1115   CREATININE 0.8 02/18/2015 0914   CREATININE 0.85 01/17/2015 1115      Component Value Date/Time   CALCIUM 9.5 02/18/2015 0914   CALCIUM 7.5* 01/17/2015 1115   ALKPHOS 103 02/18/2015 0914   AST 17 02/18/2015 0914   ALT 15 02/18/2015 0914   BILITOT 0.32 02/18/2015 0914       Lab Results  Component Value Date   WBC 3.9 03/08/2015   HGB 11.5* 03/08/2015   HCT 34.2* 03/08/2015   MCV 97.8 03/08/2015   PLT 213 03/08/2015   NEUTROABS 2.1 03/08/2015    ASSESSMENT & PLAN:  Breast cancer of upper-outer quadrant of left female breast Left breast invasive ductal carcinoma grade 3 ER 0% PR  0% HER-2 positive ratio 3.74, Ki-67 90%: 2.3 cm mass by ultrasound clinical stage: T2, N0, M0 stage II A. Status post left lumpectomy 01/16/2015: Negative for in situ or invasive breast cancer 0/4 lymph nodes, complete pathologic response  Chemotherapy summary: Completed 6 cycles of neoadjuvant chemotherapy with Taxotere, carboplatin, Herceptin and Perjeta every 3 weeks. Herceptin maintenance therapy started 01/07/2015 Toxicities of chemotherapy: Sore throat, alopecia, fatigue, chemotherapy induced anemia, hypokalemia, diarrhea related to Perjeta. She received IV fluids and hydration after 6 cycles of [place] Perjeta because of diarrhea and dehydration.  Diarrhea: comes on once every 5 days with abdominal cramps. No association with food. It is possible that it comes from Herceptin. I will decrease the dose of Herceptin to 4 mg/ kilogram.  Plan: 1. Continue maintenance Herceptin 2. Adjuvant radiation therapy:   CT simulation May 17  patient plans to go to [facility] for her daughter's graduation and she is very excited about this trip. Return to clinic once every 6 weeks     Orders Placed This Encounter  Procedures  . CBC with Differential    Standing Status: Future     Number of Occurrences:      Standing Expiration Date: 03/07/2016  . Comprehensive metabolic panel (Cmet) - [facility]    Standing Status: Future     Number of Occurrences:      Standing Expiration Date: 03/07/2016   The patient has a good understanding of the overall plan. she agrees with it. she will call with any problems that may develop before the next visit here.   Rulon Eisenmenger, MD

## 2015-03-08 NOTE — Patient Instructions (Signed)
[facility] Discharge Instructions for Patients Receiving Chemotherapy  Today you received the following chemotherapy agent: Herceptin   To help prevent nausea and vomiting after your treatment, we encourage you to take your nausea medication as prescribed.    If you develop nausea and vomiting that is not controlled by your nausea medication, call the clinic.   BELOW ARE SYMPTOMS THAT SHOULD BE REPORTED IMMEDIATELY:  *FEVER GREATER THAN 100.5 F  *CHILLS WITH OR WITHOUT FEVER  NAUSEA AND VOMITING THAT IS NOT CONTROLLED WITH YOUR NAUSEA MEDICATION  *UNUSUAL SHORTNESS OF BREATH  *UNUSUAL BRUISING OR BLEEDING  TENDERNESS IN MOUTH AND THROAT WITH OR WITHOUT PRESENCE OF ULCERS  *URINARY PROBLEMS  *BOWEL PROBLEMS  UNUSUAL RASH Items with * indicate a potential emergency and should be followed up as soon as possible.  Feel free to call the clinic you have any questions or concerns. The clinic phone number is [phone].  Please show the [place] at check-in to the Emergency Department and triage nurse.

## 2015-03-08 NOTE — Addendum Note (Signed)
Addended by: Wyline Beady A on: 03/08/2015 12:31 PM   Modules accepted: Medications

## 2015-03-08 NOTE — Progress Notes (Signed)
Last echo 10/2014 with EF 60-65%; ok to proceed via Beverlee Nims, RN to Dr. Lindi Adie. New orders placed for f/u echo.

## 2015-03-10 ENCOUNTER — Ambulatory Visit (HOSPITAL_COMMUNITY)
Admission: RE | Admit: 2015-03-10 | Discharge: 2015-03-10 | Disposition: A | Discharge status: Home / Self Care | Payer: BLUE CROSS/BLUE SHIELD | Source: Ambulatory Visit | Attending: Hematology and Oncology | Admitting: Hematology and Oncology

## 2015-03-10 DIAGNOSIS — R55 Syncope and collapse: Secondary | ICD-10-CM | POA: Insufficient documentation

## 2015-03-10 DIAGNOSIS — E860 Dehydration: Secondary | ICD-10-CM | POA: Diagnosis not present

## 2015-03-10 DIAGNOSIS — E876 Hypokalemia: Secondary | ICD-10-CM | POA: Diagnosis not present

## 2015-03-10 DIAGNOSIS — C50412 Malignant neoplasm of upper-outer quadrant of left female breast: Secondary | ICD-10-CM | POA: Diagnosis not present

## 2015-03-10 NOTE — Progress Notes (Signed)
*  PRELIMINARY RESULTS* Echocardiogram 2D Echocardiogram has been performed.  Julie [name] 03/10/2015, 3:44 PM

## 2015-03-15 ENCOUNTER — Ambulatory Visit
Admission: RE | Admit: 2015-03-15 | Discharge: 2015-03-15 | Disposition: A | Discharge status: Home / Self Care | Payer: BLUE CROSS/BLUE SHIELD | Source: Ambulatory Visit | Attending: Radiation Oncology | Admitting: Radiation Oncology

## 2015-03-15 DIAGNOSIS — C50412 Malignant neoplasm of upper-outer quadrant of left female breast: Secondary | ICD-10-CM

## 2015-03-15 NOTE — Progress Notes (Signed)
Name: Julie [name]   MRN: [id number]  Date:  03/15/2015  DOB: 1956-01-13  Status:outpatient   DIAGNOSIS: Left Breast cancer.  CONSENT VERIFIED: yes SET UP: Patient is setup supine  IMMOBILIZATION:  The following immobilization was used:Custom Moldable Pillow, breast board.  NARRATIVE: [name] was brought to the [facility].  Identity was confirmed.  All relevant records and images related to the planned course of therapy were reviewed.  Then, the patient was positioned in a stable reproducible clinical set-up for radiation therapy.  Wires were placed to delineate the clinical extent of breast tissue. A wire was placed on the scar as well.  CT images were obtained.  An isocenter was placed. Skin markings were placed.  The position of the heart was then analyzed.  Due to the proximity of the heart to the chest wall, I felt she would benefit from deep inspiration breath hold for cardiac sparing.  She was then coached and rescanned in the breath hold position.  Acceptable cardiac sparing was achieved. The CT images were loaded into the planning software where the target and avoidance structures were contoured.  The radiation prescription was entered and confirmed. The patient was discharged in stable condition and tolerated simulation well.    TREATMENT PLANNING NOTE/3D Simulation Note Treatment planning then occurred. I have requested : MLC's, isodose plan, basic dose calculation  3D simulation was performed.  I personally designed and supervised the construction of 3 medically necessary complex treatment devices in the form of MLCs which will be used for beam modification and to protect critical structures including the heart and lung as well as the immobilization device which is necessary for reproducible set up.  I have requested a dose volume histogram of the heart, lung and tumor cavity.    RESPIRATORY MOTION MANAGEMENT SIMULATION - Deep Inspiration Breath  Hold  NARRATIVE:  In order to account for effect of respiratory motion on target structures and other organs in the planning and delivery of radiotherapy, this patient underwent respiratory motion management simulation.  To accomplish this, when the patient was brought to the CT simulation planning suite, a bellows was placed on the her abdomen.  Wave forms of the patient's breathing were obtained. Coaching was performed and practice sessions initiated to monitor her ability to obtain and maintain deep inspiration breath hold.  The CT images were loaded into the planning software and fused with her free breathing images by physics.  Acceptable cardiac sparing was achieved through the use of deep inspiration breath hold.  Planning will be performed on her breath hold scan

## 2015-03-17 DIAGNOSIS — C50412 Malignant neoplasm of upper-outer quadrant of left female breast: Secondary | ICD-10-CM | POA: Diagnosis not present

## 2015-03-18 DIAGNOSIS — C50412 Malignant neoplasm of upper-outer quadrant of left female breast: Secondary | ICD-10-CM | POA: Diagnosis not present

## 2015-03-22 ENCOUNTER — Ambulatory Visit: Payer: BLUE CROSS/BLUE SHIELD | Admitting: Radiation Oncology

## 2015-03-23 ENCOUNTER — Ambulatory Visit: Payer: BLUE CROSS/BLUE SHIELD

## 2015-03-23 ENCOUNTER — Ambulatory Visit: Payer: BLUE CROSS/BLUE SHIELD | Admitting: Radiation Oncology

## 2015-03-24 ENCOUNTER — Ambulatory Visit
Admission: RE | Admit: 2015-03-24 | Discharge: 2015-03-24 | Disposition: A | Discharge status: Home / Self Care | Payer: BLUE CROSS/BLUE SHIELD | Source: Ambulatory Visit | Attending: Radiation Oncology | Admitting: Radiation Oncology

## 2015-03-24 ENCOUNTER — Ambulatory Visit: Payer: BLUE CROSS/BLUE SHIELD

## 2015-03-24 DIAGNOSIS — C50412 Malignant neoplasm of upper-outer quadrant of left female breast: Secondary | ICD-10-CM | POA: Diagnosis not present

## 2015-03-25 ENCOUNTER — Ambulatory Visit: Payer: BLUE CROSS/BLUE SHIELD

## 2015-03-29 ENCOUNTER — Ambulatory Visit: Payer: BLUE CROSS/BLUE SHIELD

## 2015-03-29 ENCOUNTER — Ambulatory Visit
Admission: RE | Admit: 2015-03-29 | Discharge: 2015-03-29 | Disposition: A | Discharge status: Home / Self Care | Payer: BLUE CROSS/BLUE SHIELD | Source: Ambulatory Visit | Attending: Radiation Oncology | Admitting: Radiation Oncology

## 2015-03-29 ENCOUNTER — Ambulatory Visit (HOSPITAL_BASED_OUTPATIENT_CLINIC_OR_DEPARTMENT_OTHER): Payer: BLUE CROSS/BLUE SHIELD

## 2015-03-29 VITALS — BP 120/86 | HR 72 | Temp 98.2°F | Wt 157.6 lb

## 2015-03-29 VITALS — BP 144/85 | HR 75 | Temp 98.4°F

## 2015-03-29 DIAGNOSIS — Z5112 Encounter for antineoplastic immunotherapy: Secondary | ICD-10-CM | POA: Diagnosis not present

## 2015-03-29 DIAGNOSIS — C50412 Malignant neoplasm of upper-outer quadrant of left female breast: Secondary | ICD-10-CM | POA: Diagnosis not present

## 2015-03-29 DIAGNOSIS — Z510 Encounter for antineoplastic radiation therapy: Secondary | ICD-10-CM | POA: Insufficient documentation

## 2015-03-29 MED ORDER — SODIUM CHLORIDE 0.9 % IV SOLN
Freq: Once | INTRAVENOUS | Status: AC
  Administered 2015-03-29: 10:00:00 via INTRAVENOUS

## 2015-03-29 MED ORDER — ACETAMINOPHEN 325 MG PO TABS
ORAL_TABLET | ORAL | Status: AC
Start: 2015-03-29 — End: 2015-03-29
  Filled 2015-03-29: qty 2

## 2015-03-29 MED ORDER — HEPARIN SOD (PORK) LOCK FLUSH 100 UNIT/ML IV SOLN
500.0000 [IU] | Freq: Once | INTRAVENOUS | Status: AC | PRN
  Administered 2015-03-29: 500 [IU]
  Filled 2015-03-29: qty 5

## 2015-03-29 MED ORDER — ALRA NON-METALLIC DEODORANT (RAD-ONC)
1.0000 "application " | Freq: Once | TOPICAL | Status: AC
  Administered 2015-03-29: 1 via TOPICAL

## 2015-03-29 MED ORDER — DIPHENHYDRAMINE HCL 25 MG PO CAPS
50.0000 mg | ORAL_CAPSULE | Freq: Once | ORAL | Status: AC
  Administered 2015-03-29: 50 mg via ORAL

## 2015-03-29 MED ORDER — TRASTUZUMAB CHEMO INJECTION 440 MG
4.0000 mg/kg | Freq: Once | INTRAVENOUS | Status: AC
  Administered 2015-03-29: 294 mg via INTRAVENOUS
  Filled 2015-03-29: qty 14

## 2015-03-29 MED ORDER — DIPHENHYDRAMINE HCL 25 MG PO CAPS
ORAL_CAPSULE | ORAL | Status: AC
Start: 2015-03-29 — End: 2015-03-29
  Filled 2015-03-29: qty 2

## 2015-03-29 MED ORDER — RADIAPLEXRX EX GEL
Freq: Once | CUTANEOUS | Status: AC
  Administered 2015-03-29: 15:00:00 via TOPICAL

## 2015-03-29 MED ORDER — ACETAMINOPHEN 325 MG PO TABS
650.0000 mg | ORAL_TABLET | Freq: Once | ORAL | Status: AC
  Administered 2015-03-29: 650 mg via ORAL

## 2015-03-29 MED ORDER — SODIUM CHLORIDE 0.9 % IJ SOLN
10.0000 mL | INTRAMUSCULAR | Status: DC | PRN
  Administered 2015-03-29: 10 mL
  Filled 2015-03-29: qty 10

## 2015-03-29 NOTE — Progress Notes (Signed)
Weekly Management Note Current Dose: 1.8  Gy  Projected Dose: 61 Gy   Narrative:  The patient presents for routine under treatment assessment.  CBCT/MVCT images/Port film x-rays were reviewed.  The chart was checked. Doing well. No complaints.   Physical Findings: Weight: 157 lb 9.6 oz (71.487 kg). Unchanged  Impression:  The patient is tolerating radiation.  Plan:  Continue treatment as planned. RN education performed.

## 2015-03-29 NOTE — Patient Instructions (Signed)
[facility] Discharge Instructions for Patients Receiving Chemotherapy  Today you received the following chemotherapy agents Herceptin.  To help prevent nausea and vomiting after your treatment, we encourage you to take your nausea medication as prescrbed.   If you develop nausea and vomiting that is not controlled by your nausea medication, call the clinic.   BELOW ARE SYMPTOMS THAT SHOULD BE REPORTED IMMEDIATELY:  *FEVER GREATER THAN 100.5 F  *CHILLS WITH OR WITHOUT FEVER  NAUSEA AND VOMITING THAT IS NOT CONTROLLED WITH YOUR NAUSEA MEDICATION  *UNUSUAL SHORTNESS OF BREATH  *UNUSUAL BRUISING OR BLEEDING  TENDERNESS IN MOUTH AND THROAT WITH OR WITHOUT PRESENCE OF ULCERS  *URINARY PROBLEMS  *BOWEL PROBLEMS  UNUSUAL RASH Items with * indicate a potential emergency and should be followed up as soon as possible.  Feel free to call the clinic you have any questions or concerns. The clinic phone number is [phone].  Please show the [place] at check-in to the Emergency Department and triage nurse.

## 2015-03-29 NOTE — Progress Notes (Signed)
Pt here for patient teaching.  Pt given Radiation and You booklet, skin care instructions, Alra deodorant and Radiaplex gel. Reviewed areas of pertinence such as  . Pt able to give teach back of to pat skin, use unscented/gentle soap and drink plenty of water,apply Radiaplex bid, avoid applying anything to skin within 4 hours of treatment, avoid wearing an under wire bra and to use an electric razor if they must shave. Pt needs reinforcement and verbalizes understanding of information given and will contact nursing with any questions or concerns.        

## 2015-03-30 ENCOUNTER — Ambulatory Visit
Admission: RE | Admit: 2015-03-30 | Discharge: 2015-03-30 | Disposition: A | Discharge status: Home / Self Care | Payer: BLUE CROSS/BLUE SHIELD | Source: Ambulatory Visit | Attending: Radiation Oncology | Admitting: Radiation Oncology

## 2015-03-30 DIAGNOSIS — C50412 Malignant neoplasm of upper-outer quadrant of left female breast: Secondary | ICD-10-CM | POA: Diagnosis not present

## 2015-03-31 ENCOUNTER — Ambulatory Visit
Admission: RE | Admit: 2015-03-31 | Discharge: 2015-03-31 | Disposition: A | Discharge status: Home / Self Care | Payer: BLUE CROSS/BLUE SHIELD | Source: Ambulatory Visit | Attending: Radiation Oncology | Admitting: Radiation Oncology

## 2015-03-31 DIAGNOSIS — C50412 Malignant neoplasm of upper-outer quadrant of left female breast: Secondary | ICD-10-CM | POA: Diagnosis not present

## 2015-04-01 ENCOUNTER — Ambulatory Visit
Admission: RE | Admit: 2015-04-01 | Discharge: 2015-04-01 | Disposition: A | Discharge status: Home / Self Care | Payer: BLUE CROSS/BLUE SHIELD | Source: Ambulatory Visit | Attending: Radiation Oncology | Admitting: Radiation Oncology

## 2015-04-01 DIAGNOSIS — C50412 Malignant neoplasm of upper-outer quadrant of left female breast: Secondary | ICD-10-CM | POA: Diagnosis not present

## 2015-04-04 ENCOUNTER — Ambulatory Visit
Admission: RE | Admit: 2015-04-04 | Discharge: 2015-04-04 | Disposition: A | Discharge status: Home / Self Care | Payer: BLUE CROSS/BLUE SHIELD | Source: Ambulatory Visit | Attending: Radiation Oncology | Admitting: Radiation Oncology

## 2015-04-04 DIAGNOSIS — C50412 Malignant neoplasm of upper-outer quadrant of left female breast: Secondary | ICD-10-CM | POA: Diagnosis not present

## 2015-04-05 ENCOUNTER — Encounter: Payer: Self-pay | Admitting: *Deleted

## 2015-04-05 ENCOUNTER — Ambulatory Visit
Admission: RE | Admit: 2015-04-05 | Discharge: 2015-04-05 | Disposition: A | Discharge status: Home / Self Care | Payer: BLUE CROSS/BLUE SHIELD | Source: Ambulatory Visit | Attending: Radiation Oncology | Admitting: Radiation Oncology

## 2015-04-05 VITALS — BP 136/90 | HR 75 | Temp 98.3°F | Wt 157.2 lb

## 2015-04-05 DIAGNOSIS — C50412 Malignant neoplasm of upper-outer quadrant of left female breast: Secondary | ICD-10-CM

## 2015-04-05 NOTE — Progress Notes (Signed)
Weekly Management Note Current Dose: 10.8  Gy  Projected Dose: 61 Gy   Narrative:  The patient presents for routine under treatment assessment.  CBCT/MVCT images/Port film x-rays were reviewed.  The chart was checked. Doing well. No complaints.   Physical Findings: Weight: 157 lb 3.2 oz (71.305 kg). Unchanged  Impression:  The patient is tolerating radiation.  Plan:  Continue treatment as planned. Continue radiaplex.

## 2015-04-05 NOTE — Progress Notes (Signed)
Faxed clearance for teeth cleanings to Dr. Evelene Croon at [phone].

## 2015-04-06 ENCOUNTER — Ambulatory Visit
Admission: RE | Admit: 2015-04-06 | Discharge: 2015-04-06 | Disposition: A | Discharge status: Home / Self Care | Payer: BLUE CROSS/BLUE SHIELD | Source: Ambulatory Visit | Attending: Radiation Oncology | Admitting: Radiation Oncology

## 2015-04-06 DIAGNOSIS — C50412 Malignant neoplasm of upper-outer quadrant of left female breast: Secondary | ICD-10-CM | POA: Diagnosis not present

## 2015-04-07 ENCOUNTER — Ambulatory Visit
Admission: RE | Admit: 2015-04-07 | Discharge: 2015-04-07 | Disposition: A | Discharge status: Home / Self Care | Payer: BLUE CROSS/BLUE SHIELD | Source: Ambulatory Visit | Attending: Radiation Oncology | Admitting: Radiation Oncology

## 2015-04-07 DIAGNOSIS — C50412 Malignant neoplasm of upper-outer quadrant of left female breast: Secondary | ICD-10-CM | POA: Diagnosis not present

## 2015-04-08 ENCOUNTER — Ambulatory Visit
Admission: RE | Admit: 2015-04-08 | Discharge: 2015-04-08 | Disposition: A | Discharge status: Home / Self Care | Payer: BLUE CROSS/BLUE SHIELD | Source: Ambulatory Visit | Attending: Radiation Oncology | Admitting: Radiation Oncology

## 2015-04-08 DIAGNOSIS — C50412 Malignant neoplasm of upper-outer quadrant of left female breast: Secondary | ICD-10-CM | POA: Diagnosis not present

## 2015-04-11 ENCOUNTER — Ambulatory Visit
Admission: RE | Admit: 2015-04-11 | Discharge: 2015-04-11 | Disposition: A | Discharge status: Home / Self Care | Payer: BLUE CROSS/BLUE SHIELD | Source: Ambulatory Visit | Attending: Radiation Oncology | Admitting: Radiation Oncology

## 2015-04-11 DIAGNOSIS — C50412 Malignant neoplasm of upper-outer quadrant of left female breast: Secondary | ICD-10-CM | POA: Diagnosis not present

## 2015-04-12 ENCOUNTER — Encounter: Payer: Self-pay | Admitting: Radiation Oncology

## 2015-04-12 ENCOUNTER — Ambulatory Visit
Admission: RE | Admit: 2015-04-12 | Discharge: 2015-04-12 | Disposition: A | Discharge status: Home / Self Care | Payer: BLUE CROSS/BLUE SHIELD | Source: Ambulatory Visit | Attending: Radiation Oncology | Admitting: Radiation Oncology

## 2015-04-12 VITALS — BP 126/80 | HR 73 | Resp 16 | Wt 156.6 lb

## 2015-04-12 DIAGNOSIS — C50412 Malignant neoplasm of upper-outer quadrant of left female breast: Secondary | ICD-10-CM

## 2015-04-12 NOTE — Progress Notes (Signed)
Weekly Management Note Current Dose: 19.8  Gy  Projected Dose: 61 Gy   Narrative:  The patient presents for routine under treatment assessment.  CBCT/MVCT images/Port film x-rays were reviewed.  The chart was checked. Doing well. "Itchy" back.   Physical Findings: Weight: 156 lb 9.6 oz (71.033 kg). Unchanged skin. Scratch marks on her back.   Impression:  The patient is tolerating radiation.   Plan:  Continue treatment as planned. Continue radiaplex.

## 2015-04-12 NOTE — Progress Notes (Signed)
Weight and vitals stable. Denies pain. Faint hyperpigmentation of left chest noted without desquamation. Reports dry itchy skin on her upper back. Reports using radiaplex bid as directed. Advised patient to apply radiaplex to her back as well as her treatment field. Denies fatigue. Patient without complaints today.  Wt Readings from Last 3 Encounters:  04/12/15 156 lb 9.6 oz (71.033 kg)  04/05/15 157 lb 3.2 oz (71.305 kg)  03/29/15 157 lb 9.6 oz (71.487 kg)   BP 126/80 mmHg  Pulse 73  Resp 16  Wt 156 lb 9.6 oz (71.033 kg)

## 2015-04-13 ENCOUNTER — Ambulatory Visit
Admission: RE | Admit: 2015-04-13 | Discharge: 2015-04-13 | Disposition: A | Discharge status: Home / Self Care | Payer: BLUE CROSS/BLUE SHIELD | Source: Ambulatory Visit | Attending: Radiation Oncology | Admitting: Radiation Oncology

## 2015-04-13 DIAGNOSIS — C50412 Malignant neoplasm of upper-outer quadrant of left female breast: Secondary | ICD-10-CM | POA: Diagnosis not present

## 2015-04-14 ENCOUNTER — Ambulatory Visit
Admission: RE | Admit: 2015-04-14 | Discharge: 2015-04-14 | Disposition: A | Discharge status: Home / Self Care | Payer: BLUE CROSS/BLUE SHIELD | Source: Ambulatory Visit | Attending: Radiation Oncology | Admitting: Radiation Oncology

## 2015-04-14 DIAGNOSIS — C50412 Malignant neoplasm of upper-outer quadrant of left female breast: Secondary | ICD-10-CM | POA: Diagnosis not present

## 2015-04-15 ENCOUNTER — Ambulatory Visit
Admission: RE | Admit: 2015-04-15 | Discharge: 2015-04-15 | Disposition: A | Discharge status: Home / Self Care | Payer: BLUE CROSS/BLUE SHIELD | Source: Ambulatory Visit | Attending: Radiation Oncology | Admitting: Radiation Oncology

## 2015-04-15 DIAGNOSIS — C50412 Malignant neoplasm of upper-outer quadrant of left female breast: Secondary | ICD-10-CM | POA: Diagnosis not present

## 2015-04-16 ENCOUNTER — Ambulatory Visit: Payer: BLUE CROSS/BLUE SHIELD

## 2015-04-18 ENCOUNTER — Ambulatory Visit
Admission: RE | Admit: 2015-04-18 | Discharge: 2015-04-18 | Disposition: A | Discharge status: Home / Self Care | Payer: BLUE CROSS/BLUE SHIELD | Source: Ambulatory Visit | Attending: Radiation Oncology | Admitting: Radiation Oncology

## 2015-04-18 ENCOUNTER — Other Ambulatory Visit: Payer: BLUE CROSS/BLUE SHIELD

## 2015-04-18 ENCOUNTER — Telehealth: Payer: Self-pay | Admitting: *Deleted

## 2015-04-18 ENCOUNTER — Ambulatory Visit: Payer: BLUE CROSS/BLUE SHIELD | Admitting: Hematology and Oncology

## 2015-04-18 DIAGNOSIS — C50412 Malignant neoplasm of upper-outer quadrant of left female breast: Secondary | ICD-10-CM | POA: Diagnosis not present

## 2015-04-18 NOTE — Telephone Encounter (Signed)
Spoke with patient and informed her Dr. Lindi Adie would not be able to see her to day due to some travel delays.  Offered her appointment with Erasmo Downer at 945am.  She stated she would rather reschedule with Dr. Lindi Adie.   Informed her I would call her back with another appointment.

## 2015-04-19 ENCOUNTER — Ambulatory Visit: Payer: BLUE CROSS/BLUE SHIELD | Admitting: Nutrition

## 2015-04-19 ENCOUNTER — Ambulatory Visit (HOSPITAL_BASED_OUTPATIENT_CLINIC_OR_DEPARTMENT_OTHER): Payer: BLUE CROSS/BLUE SHIELD

## 2015-04-19 ENCOUNTER — Encounter: Payer: Self-pay | Admitting: Radiation Oncology

## 2015-04-19 ENCOUNTER — Ambulatory Visit
Admission: RE | Admit: 2015-04-19 | Discharge: 2015-04-19 | Disposition: A | Discharge status: Home / Self Care | Payer: BLUE CROSS/BLUE SHIELD | Source: Ambulatory Visit | Attending: Radiation Oncology | Admitting: Radiation Oncology

## 2015-04-19 ENCOUNTER — Ambulatory Visit (HOSPITAL_BASED_OUTPATIENT_CLINIC_OR_DEPARTMENT_OTHER): Payer: BLUE CROSS/BLUE SHIELD | Admitting: Hematology and Oncology

## 2015-04-19 ENCOUNTER — Telehealth: Payer: Self-pay | Admitting: *Deleted

## 2015-04-19 VITALS — BP 121/75 | HR 83 | Temp 98.5°F | Resp 18 | Ht 62.0 in | Wt 156.4 lb

## 2015-04-19 VITALS — BP 121/75 | HR 83 | Temp 98.5°F | Resp 18 | Wt 156.2 lb

## 2015-04-19 DIAGNOSIS — C50412 Malignant neoplasm of upper-outer quadrant of left female breast: Secondary | ICD-10-CM

## 2015-04-19 DIAGNOSIS — E876 Hypokalemia: Secondary | ICD-10-CM

## 2015-04-19 DIAGNOSIS — D6481 Anemia due to antineoplastic chemotherapy: Secondary | ICD-10-CM | POA: Diagnosis not present

## 2015-04-19 DIAGNOSIS — R197 Diarrhea, unspecified: Secondary | ICD-10-CM

## 2015-04-19 DIAGNOSIS — Z5112 Encounter for antineoplastic immunotherapy: Secondary | ICD-10-CM | POA: Diagnosis not present

## 2015-04-19 MED ORDER — SODIUM CHLORIDE 0.9 % IV SOLN
Freq: Once | INTRAVENOUS | Status: AC
  Administered 2015-04-19: 10:00:00 via INTRAVENOUS

## 2015-04-19 MED ORDER — ACETAMINOPHEN 325 MG PO TABS
ORAL_TABLET | ORAL | Status: AC
  Filled 2015-04-19: qty 2

## 2015-04-19 MED ORDER — DIPHENHYDRAMINE HCL 25 MG PO CAPS
ORAL_CAPSULE | ORAL | Status: AC
  Filled 2015-04-19: qty 2

## 2015-04-19 MED ORDER — DIPHENHYDRAMINE HCL 25 MG PO CAPS
50.0000 mg | ORAL_CAPSULE | Freq: Once | ORAL | Status: AC
  Administered 2015-04-19: 50 mg via ORAL

## 2015-04-19 MED ORDER — HEPARIN SOD (PORK) LOCK FLUSH 100 UNIT/ML IV SOLN
500.0000 [IU] | Freq: Once | INTRAVENOUS | Status: AC | PRN
  Administered 2015-04-19: 500 [IU]
  Filled 2015-04-19: qty 5

## 2015-04-19 MED ORDER — SODIUM CHLORIDE 0.9 % IJ SOLN
10.0000 mL | INTRAMUSCULAR | Status: DC | PRN
  Administered 2015-04-19: 10 mL
  Filled 2015-04-19: qty 10

## 2015-04-19 MED ORDER — SODIUM CHLORIDE 0.9 % IV SOLN
4.0000 mg/kg | Freq: Once | INTRAVENOUS | Status: AC
  Administered 2015-04-19: 294 mg via INTRAVENOUS
  Filled 2015-04-19: qty 14

## 2015-04-19 MED ORDER — ACETAMINOPHEN 325 MG PO TABS
650.0000 mg | ORAL_TABLET | Freq: Once | ORAL | Status: AC
  Administered 2015-04-19: 650 mg via ORAL

## 2015-04-19 NOTE — Patient Instructions (Signed)

## 2015-04-19 NOTE — Telephone Encounter (Signed)
Per staff message and POF I have scheduled appts. Advised scheduler of appts. JMW  

## 2015-04-19 NOTE — Assessment & Plan Note (Signed)
Left breast invasive ductal carcinoma grade 3 ER 0% PR 0% HER-2 positive ratio 3.74, Ki-67 90%: 2.3 cm mass by ultrasound clinical stage: T2, N0, M0 stage II A. Status post left lumpectomy 01/16/2015: Negative for in situ or invasive breast cancer 0/4 lymph nodes, complete pathologic response  Chemotherapy summary: Completed 6 cycles of neoadjuvant chemotherapy with Taxotere, carboplatin, Herceptin and Perjeta every 3 weeks. Herceptin maintenance therapy started 01/07/2015 Toxicities of chemotherapy: Sore throat, alopecia, fatigue, chemotherapy induced anemia, hypokalemia, diarrhea related to Perjeta. She received IV fluids and hydration after 6 cycles of [place] Perjeta because of diarrhea and dehydration.  Diarrhea: comes on once every 5 days with abdominal cramps. No association with food. It is possible that it comes from Herceptin. With the decrease in the dose of Herceptin to 4 mg/ kilogram, her diarrhea is much better controlled. She still takes Questran along with probiotics  Plan: 1. Continue maintenance Herceptin 2. Adjuvant radiation therapy: Started 03/29/2015  Return to clinic once every 6 weeks

## 2015-04-19 NOTE — Progress Notes (Signed)
Patient Care Team: Seward Carol, MD as PCP - General (Internal Medicine) [name], MD as Consulting Physician (Surgical Oncology) Thea Silversmith, MD as Consulting Physician (Radiation Oncology) Nicholas Lose, MD as Consulting Physician (Hematology and Oncology)  DIAGNOSIS: Breast cancer of upper-outer quadrant of left female breast   Staging form: Breast, AJCC 7th Edition     Clinical: Stage IIA (T2, N0, cM0) - Unsigned       Staging comments: Staged at breast conference 08/11/14.      Pathologic stage from 01/20/2015: Stage Unknown (yTX, N0, cM0) - Signed by Enid Cutter, MD on 01/25/2015       Staging comments: Staged on final lumpectomy specimen by Dr. Donato Heinz    SUMMARY OF ONCOLOGIC HISTORY:   Breast cancer of upper-outer quadrant of left female breast   08/05/2014 Mammogram Left breast 10 o clock: 2.3X 2X1.4 cm lobular hypoechoeic   08/05/2014 Initial Biopsy Grade 3 IDC Er 0%, PR 0%, Her 2 positive Ratio 3.74; Ki 67: 90%   08/17/2014 Breast MRI Left breast known malignancy upper inner quadrant 2.6 cm: Suspicious non-mass enhancement upper-inner quadrant right breast biopsy pending   08/27/2014 - 12/17/2014 Chemotherapy Neoadjuvant chemotherapy with Taxotere, carboplatin, Herceptin, Perjeta every 3 weeks x6 cycles of Herceptin maintenance for one year.   12/27/2014 Breast MRI Significant treatment response. Previously biopsied mass is no longer visible, residual clumped linearly oriented non-mass enhancement anterior to the biopsied mass.   01/18/2015 Surgery  left breast lumpectomy: negative for in situ and invasive cancer complete pathologic response   03/29/2015 -  Radiation Therapy Adj XRT    CHIEF COMPLIANT: Herceptin maintenance  INTERVAL HISTORY: Julie [name] is a 59 year old above-mentioned history of left breast cancer who had a complete pathologic response to new adjuvant chemotherapy. She is currently in adjuvant radiation therapy. She is receiving maintenance Herceptin. She  had diarrhea with Herceptin and a decrease in dosage to 4 mg/m. She is so much better since we reduced the dosage. She had been to [facility] for her daughter's graduation. She did not have any major problems during this trip. She is currently on radiation and had received 10 days of it.  REVIEW OF SYSTEMS:   Constitutional: Denies fevers, chills or abnormal weight loss Eyes: Denies blurriness of vision Ears, nose, mouth, throat, and face: Denies mucositis or sore throat Respiratory: Denies cough, dyspnea or wheezes Cardiovascular: Denies palpitation, chest discomfort or lower extremity swelling Gastrointestinal:  Occasional diarrhea and abdominal cramps Skin: Denies abnormal skin rashes Lymphatics: Denies new lymphadenopathy or easy bruising Neurological:Denies numbness, tingling or new weaknesses Behavioral/Psych: Mood is stable, no new changes  Breast:  denies any pain or lumps or nodules in either breasts All other systems were reviewed with the patient and are negative.  I have reviewed the past medical history, past surgical history, social history and family history with the patient and they are unchanged from previous note.  ALLERGIES:  is allergic to codeine; sulfa antibiotics; and penicillins.  MEDICATIONS:  Current Outpatient Prescriptions  Medication Sig Dispense Refill  . acetaminophen (TYLENOL) 500 MG tablet Take 500 mg by mouth every 6 (six) hours as needed.    Marland Kitchen buPROPion (WELLBUTRIN SR) 150 MG 12 hr tablet Take 150 mg by mouth every evening.     . cetirizine (ZYRTEC) 10 MG tablet Take 10 mg by mouth daily.    . cholestyramine (QUESTRAN) 4 G packet Take 1 packet (4 g total) by mouth 3 (three) times daily with meals. 60 each 3  .  esomeprazole (NEXIUM) 40 MG capsule Take 1 capsule (40 mg total) by mouth daily at 12 noon. 30 capsule 3  . fluticasone (FLONASE) 50 MCG/ACT nasal spray Place into both nostrils as needed for allergies or rhinitis.    Marland Kitchen loperamide (IMODIUM) 2 MG  capsule Take 1 capsule (2 mg total) by mouth as needed for diarrhea or loose stools. 30 capsule 2  . LORazepam (ATIVAN) 0.5 MG tablet Take 1 tablet (0.5 mg total) by mouth every 6 (six) hours as needed (Nausea or vomiting). 30 tablet 0  . ondansetron (ZOFRAN) 8 MG tablet Take 1 tablet (8 mg total) by mouth 2 (two) times daily. Start the day after chemo for 3 days. Then take as needed for nausea or vomiting. 30 tablet 1  . prochlorperazine (COMPAZINE) 10 MG tablet TAKE 1 TABLET EVERY 6 HOURS AS NEEDED FOR NAUSEA & VOMITING. 30 tablet 0  . simvastatin (ZOCOR) 20 MG tablet Take 20 mg by mouth daily at 6 PM.     . SOOLANTRA 1 % CREA   0   No current facility-administered medications for this visit.   Facility-Administered Medications Ordered in Other Visits  Medication Dose Route Frequency Provider Last Rate Last Dose  . heparin lock flush 100 unit/mL  250 Units Intracatheter PRN Susanne Borders, NP      . Influenza vac split quadrivalent PF (FLUARIX) injection 0.5 mL  0.5 mL Intramuscular Tomorrow-1000 Nicholas Lose, MD        PHYSICAL EXAMINATION: ECOG PERFORMANCE STATUS: 1 - Symptomatic but completely ambulatory  Filed Vitals:   04/19/15 0837  BP: 121/75  Pulse: 83  Temp: 98.5 F (36.9 C)  Resp: 18   Filed Weights   04/19/15 0837  Weight: 156 lb 6.4 oz (70.943 kg)    GENERAL:alert, no distress and comfortable SKIN: skin color, texture, turgor are normal, no rashes or significant lesions EYES: normal, Conjunctiva are pink and non-injected, sclera clear OROPHARYNX:no exudate, no erythema and lips, buccal mucosa, and tongue normal  NECK: supple, thyroid normal size, non-tender, without nodularity LYMPH:  no palpable lymphadenopathy in the cervical, axillary or inguinal LUNGS: clear to auscultation and percussion with normal breathing effort HEART: regular rate & rhythm and no murmurs and no lower extremity edema ABDOMEN:abdomen soft, non-tender and normal bowel  sounds Musculoskeletal:no cyanosis of digits and no clubbing  NEURO: alert & oriented x 3 with fluent speech, no focal motor/sensory deficits  LABORATORY DATA:  I have reviewed the data as listed   Chemistry      Component Value Date/Time   NA 144 02/18/2015 0914   NA 142 01/17/2015 1115   K 3.4* 02/18/2015 0914   K 3.9 01/17/2015 1115   CL 105 01/17/2015 1115   CO2 23 02/18/2015 0914   CO2 29 01/17/2015 1115   BUN 14.3 02/18/2015 0914   BUN <5* 01/17/2015 1115   CREATININE 0.8 02/18/2015 0914   CREATININE 0.85 01/17/2015 1115      Component Value Date/Time   CALCIUM 9.5 02/18/2015 0914   CALCIUM 7.5* 01/17/2015 1115   ALKPHOS 103 02/18/2015 0914   AST 17 02/18/2015 0914   ALT 15 02/18/2015 0914   BILITOT 0.32 02/18/2015 0914       Lab Results  Component Value Date   WBC 3.9 03/08/2015   HGB 11.5* 03/08/2015   HCT 34.2* 03/08/2015   MCV 97.8 03/08/2015   PLT 213 03/08/2015   NEUTROABS 2.1 03/08/2015    ASSESSMENT & PLAN:  Breast cancer of upper-outer  quadrant of left female breast Left breast invasive ductal carcinoma grade 3 ER 0% PR 0% HER-2 positive ratio 3.74, Ki-67 90%: 2.3 cm mass by ultrasound clinical stage: T2, N0, M0 stage II A. Status post left lumpectomy 01/16/2015: Negative for in situ or invasive breast cancer 0/4 lymph nodes, complete pathologic response  Chemotherapy summary: Completed 6 cycles of neoadjuvant chemotherapy with Taxotere, carboplatin, Herceptin and Perjeta every 3 weeks. Herceptin maintenance therapy started 01/07/2015 Toxicities of chemotherapy: Sore throat, alopecia, fatigue, chemotherapy induced anemia, hypokalemia, diarrhea related to Perjeta. She received IV fluids and hydration after 6 cycles of [place] Perjeta because of diarrhea and dehydration.  Diarrhea: comes on once every 5 days with abdominal cramps. No association with food. It is possible that it comes from Herceptin. With the decrease in the dose of Herceptin to 4 mg/  kilogram, her diarrhea is much better controlled. She still takes Questran along with probiotics  Plan: 1. Continue maintenance Herceptin 2. Adjuvant radiation therapy: Started 03/29/2015  Return to clinic once every 6 weeks    No orders of the defined types were placed in this encounter.   The patient has a good understanding of the overall plan. she agrees with it. she will call with any problems that may develop before the next visit here.   Rulon Eisenmenger, MD

## 2015-04-19 NOTE — Progress Notes (Addendum)
Weekly assessment of radiation to left breast.Completed 16 of 33 treatments.Mild tanning of skin.Denies pain.Seen for 6 week check up with Dr.Gudena.Scheduled for herceptin today.No questions or concerns.No increase in fatigue.has some mild neuropathy of feet but medication not required. BP 121/75 mmHg  Pulse 83  Temp(Src) 98.5 F (36.9 C)  Resp 18  Wt 156 lb 3.2 oz (70.852 kg)  SpO2 99%

## 2015-04-19 NOTE — Progress Notes (Signed)
Weekly Management Note Current Dose: 28.8  Gy  Projected Dose: 61 Gy   Narrative:  The patient presents for routine under treatment assessment.  CBCT/MVCT images/Port film x-rays were reviewed.  The chart was checked. Doing well. No complaints. Herceptin today.   Physical Findings: Weight: 156 lb 3.2 oz (70.852 kg). Minimal tanning of skin.   Impression:  The patient is tolerating radiation.  Plan:  Continue treatment as planned. Continue radiaplex.

## 2015-04-19 NOTE — Progress Notes (Signed)
Brief follow-up with patient in chemotherapy. Patient reports her appetite has improved and she is eating more vegetables. Patient denies nutrition side effects at this time. Weight is stable. Encouraged patient to continue healthy diet during chemotherapy.  Contact me with questions or concerns.  **Disclaimer: This note was dictated with voice recognition software. Similar sounding words can inadvertently be transcribed and this note may contain transcription errors which may not have been corrected upon publication of note.**

## 2015-04-20 ENCOUNTER — Ambulatory Visit
Admission: RE | Admit: 2015-04-20 | Discharge: 2015-04-20 | Disposition: A | Discharge status: Home / Self Care | Payer: BLUE CROSS/BLUE SHIELD | Source: Ambulatory Visit | Attending: Radiation Oncology | Admitting: Radiation Oncology

## 2015-04-20 ENCOUNTER — Telehealth: Payer: Self-pay | Admitting: Hematology and Oncology

## 2015-04-20 DIAGNOSIS — C50412 Malignant neoplasm of upper-outer quadrant of left female breast: Secondary | ICD-10-CM | POA: Diagnosis not present

## 2015-04-20 NOTE — Telephone Encounter (Signed)
Patient called in to move her 7/12 herceptin to 7/13,done    anne

## 2015-04-21 ENCOUNTER — Ambulatory Visit
Admission: RE | Admit: 2015-04-21 | Discharge: 2015-04-21 | Disposition: A | Discharge status: Home / Self Care | Payer: BLUE CROSS/BLUE SHIELD | Source: Ambulatory Visit | Attending: Radiation Oncology | Admitting: Radiation Oncology

## 2015-04-21 DIAGNOSIS — C50412 Malignant neoplasm of upper-outer quadrant of left female breast: Secondary | ICD-10-CM | POA: Diagnosis not present

## 2015-04-22 ENCOUNTER — Ambulatory Visit
Admission: RE | Admit: 2015-04-22 | Discharge: 2015-04-22 | Disposition: A | Discharge status: Home / Self Care | Payer: BLUE CROSS/BLUE SHIELD | Source: Ambulatory Visit | Attending: Radiation Oncology | Admitting: Radiation Oncology

## 2015-04-22 DIAGNOSIS — C50412 Malignant neoplasm of upper-outer quadrant of left female breast: Secondary | ICD-10-CM | POA: Diagnosis not present

## 2015-04-23 ENCOUNTER — Ambulatory Visit: Payer: BLUE CROSS/BLUE SHIELD

## 2015-04-25 ENCOUNTER — Ambulatory Visit
Admission: RE | Admit: 2015-04-25 | Discharge: 2015-04-25 | Disposition: A | Discharge status: Home / Self Care | Payer: BLUE CROSS/BLUE SHIELD | Source: Ambulatory Visit | Attending: Radiation Oncology | Admitting: Radiation Oncology

## 2015-04-25 DIAGNOSIS — C50412 Malignant neoplasm of upper-outer quadrant of left female breast: Secondary | ICD-10-CM | POA: Diagnosis not present

## 2015-04-26 ENCOUNTER — Ambulatory Visit
Admission: RE | Admit: 2015-04-26 | Discharge: 2015-04-26 | Disposition: A | Discharge status: Home / Self Care | Payer: BLUE CROSS/BLUE SHIELD | Source: Ambulatory Visit | Attending: Radiation Oncology | Admitting: Radiation Oncology

## 2015-04-26 VITALS — BP 127/80 | HR 71 | Temp 98.0°F | Wt 158.7 lb

## 2015-04-26 DIAGNOSIS — C50412 Malignant neoplasm of upper-outer quadrant of left female breast: Secondary | ICD-10-CM

## 2015-04-26 MED ORDER — RADIAPLEXRX EX GEL
Freq: Once | CUTANEOUS | Status: AC
  Administered 2015-04-26: 10:00:00 via TOPICAL

## 2015-04-26 NOTE — Progress Notes (Signed)
  Radiation Oncology         [phone]   Name: Julie [name] MRN: [id number]   Date: 04/26/2015  DOB: Apr 07, 1956   Weekly Radiation Therapy Management    ICD-9-CM ICD-10-CM   1. Breast cancer of upper-outer quadrant of left female breast 174.4 C50.412 hyaluronate sodium (RADIAPLEXRX) gel    Current Dose: 37.8 Gy  Planned Dose:  61 Gy  Narrative The patient presents for routine under treatment assessment. Marked for boost today. Skin with moderate redness. Denies pain but does have increase in fatigue. Able to perform daily acitivities and go to gym 3 to 4 times weekly. Continue application of radiaplex twice daily. New tube given. Discussed upcoming focused radiation. The patient is without complaint. Set-up films were reviewed. The chart was checked.  Physical Findings  weight is 158 lb 11.2 oz (71.986 kg). Her temperature is 98 F (36.7 C). Her blood pressure is 127/80 and her pulse is 71. . Weight essentially stable.  No significant changes.  Impression The patient is tolerating radiation.  Plan Continue treatment as planned.    This document serves as a record of services personally performed by Tyler Pita, MD. It was created on his behalf by Arlyce Harman, a trained medical scribe. The creation of this record is based on the scribe's personal observations and the provider's statements to them. This document has been checked and approved by the attending provider.       Sheral Apley Tammi Klippel, M.D.

## 2015-04-26 NOTE — Progress Notes (Signed)
Weekly assessment of radiation to left breast.Completed 21 of 33 treatments.Marked for boost today.Skin with moderate redness.Denies pain but does have increase in fatigue.Able to perform daily acitivities and go to gym 3 to 4 times weekly.Continue application of radiaplex twice daily.New tube given. There were no vitals taken for this visit.

## 2015-04-27 ENCOUNTER — Ambulatory Visit
Admission: RE | Admit: 2015-04-27 | Discharge: 2015-04-27 | Disposition: A | Discharge status: Home / Self Care | Payer: BLUE CROSS/BLUE SHIELD | Source: Ambulatory Visit | Attending: Radiation Oncology | Admitting: Radiation Oncology

## 2015-04-27 DIAGNOSIS — Z510 Encounter for antineoplastic radiation therapy: Secondary | ICD-10-CM | POA: Diagnosis present

## 2015-04-27 DIAGNOSIS — C50412 Malignant neoplasm of upper-outer quadrant of left female breast: Secondary | ICD-10-CM | POA: Diagnosis not present

## 2015-04-28 ENCOUNTER — Ambulatory Visit
Admission: RE | Admit: 2015-04-28 | Discharge: 2015-04-28 | Disposition: A | Discharge status: Home / Self Care | Payer: BLUE CROSS/BLUE SHIELD | Source: Ambulatory Visit | Attending: Radiation Oncology | Admitting: Radiation Oncology

## 2015-04-28 DIAGNOSIS — Z510 Encounter for antineoplastic radiation therapy: Secondary | ICD-10-CM | POA: Diagnosis not present

## 2015-04-29 ENCOUNTER — Ambulatory Visit
Admission: RE | Admit: 2015-04-29 | Discharge: 2015-04-29 | Disposition: A | Discharge status: Home / Self Care | Payer: BLUE CROSS/BLUE SHIELD | Source: Ambulatory Visit | Attending: Radiation Oncology | Admitting: Radiation Oncology

## 2015-04-29 DIAGNOSIS — Z510 Encounter for antineoplastic radiation therapy: Secondary | ICD-10-CM | POA: Diagnosis not present

## 2015-05-03 ENCOUNTER — Other Ambulatory Visit: Payer: Self-pay

## 2015-05-03 ENCOUNTER — Ambulatory Visit (HOSPITAL_COMMUNITY)
Admission: RE | Admit: 2015-05-03 | Discharge: 2015-05-03 | Disposition: A | Discharge status: Home / Self Care | Payer: BLUE CROSS/BLUE SHIELD | Source: Ambulatory Visit | Attending: Radiation Oncology | Admitting: Radiation Oncology

## 2015-05-03 ENCOUNTER — Ambulatory Visit
Admission: RE | Admit: 2015-05-03 | Discharge: 2015-05-03 | Disposition: A | Discharge status: Home / Self Care | Payer: BLUE CROSS/BLUE SHIELD | Source: Ambulatory Visit | Attending: Radiation Oncology | Admitting: Radiation Oncology

## 2015-05-03 ENCOUNTER — Telehealth: Payer: Self-pay

## 2015-05-03 ENCOUNTER — Ambulatory Visit (HOSPITAL_BASED_OUTPATIENT_CLINIC_OR_DEPARTMENT_OTHER)
Admission: RE | Admit: 2015-05-03 | Discharge: 2015-05-03 | Disposition: A | Discharge status: Home / Self Care | Payer: BLUE CROSS/BLUE SHIELD | Source: Ambulatory Visit | Attending: Radiation Oncology | Admitting: Radiation Oncology

## 2015-05-03 DIAGNOSIS — M79602 Pain in left arm: Secondary | ICD-10-CM | POA: Insufficient documentation

## 2015-05-03 DIAGNOSIS — C50412 Malignant neoplasm of upper-outer quadrant of left female breast: Secondary | ICD-10-CM

## 2015-05-03 DIAGNOSIS — M7989 Other specified soft tissue disorders: Secondary | ICD-10-CM | POA: Diagnosis not present

## 2015-05-03 DIAGNOSIS — Z510 Encounter for antineoplastic radiation therapy: Secondary | ICD-10-CM | POA: Diagnosis not present

## 2015-05-03 NOTE — Progress Notes (Signed)
Weekly Management Note Current Dose: 45  Gy  Projected Dose: 61 Gy   Narrative:  The patient presents for routine under treatment assessment.  CBCT/MVCT images/Port film x-rays were reviewed.  The chart was checked. Doing OK. Port "felt weird" on Sunday then developed left arm swelling yesterday. No fevers or chils. Arm is still swollen and red with tingling sensation in fingers.   Physical Findings: Weight: 159 lb 3.2 oz (72.213 kg). Swollen left arm. Mildly pink. PAC appears normal.   Impression:  The patient is tolerating radiation.  Plan:  Continue treatment as planned. UE doppler to rule out clot. If not clot, refer to PT to begin decompression. Not treating lymph nodes.

## 2015-05-03 NOTE — Progress Notes (Signed)
Completed 25 of 33 treatments to left breast.Hyperpigmentation of breast is moderate.Patient noticed upon awakening yesterday morning that left arm is red and swollen, with tingling sensation.Will inform Dr.Wentworth of findings.

## 2015-05-03 NOTE — Progress Notes (Signed)
VASCULAR LAB PRELIMINARY  PRELIMINARY  PRELIMINARY  PRELIMINARY  Left upper extremity venous duplex completed.    Preliminary report:  Left:  No evidence of DVT or superficial thrombosis.    Karima Carrell, RVT 05/03/2015, 10:12 AM

## 2015-05-03 NOTE — Telephone Encounter (Signed)
Doppler of left upper extremity negative for dvt or thrombosis per [facility] in vascular lab.We will get patient scheduled for lymphedema clinic.

## 2015-05-04 ENCOUNTER — Telehealth: Payer: Self-pay | Admitting: *Deleted

## 2015-05-04 ENCOUNTER — Encounter (HOSPITAL_COMMUNITY): Payer: Self-pay

## 2015-05-04 ENCOUNTER — Ambulatory Visit (HOSPITAL_COMMUNITY)
Admission: RE | Admit: 2015-05-04 | Discharge: 2015-05-04 | Disposition: A | Discharge status: Home / Self Care | Payer: BLUE CROSS/BLUE SHIELD | Source: Ambulatory Visit | Attending: Radiation Oncology | Admitting: Radiation Oncology

## 2015-05-04 ENCOUNTER — Ambulatory Visit (HOSPITAL_COMMUNITY)
Admission: RE | Admit: 2015-05-04 | Discharge: 2015-05-04 | Disposition: A | Discharge status: Home / Self Care | Payer: BLUE CROSS/BLUE SHIELD | Source: Ambulatory Visit | Attending: Hematology and Oncology | Admitting: Hematology and Oncology

## 2015-05-04 ENCOUNTER — Other Ambulatory Visit: Payer: Self-pay | Admitting: *Deleted

## 2015-05-04 ENCOUNTER — Ambulatory Visit: Payer: BLUE CROSS/BLUE SHIELD | Admitting: Physical Therapy

## 2015-05-04 ENCOUNTER — Ambulatory Visit (HOSPITAL_BASED_OUTPATIENT_CLINIC_OR_DEPARTMENT_OTHER)
Admission: RE | Admit: 2015-05-04 | Discharge: 2015-05-04 | Disposition: A | Discharge status: Home / Self Care | Payer: BLUE CROSS/BLUE SHIELD | Source: Ambulatory Visit | Attending: Hematology and Oncology | Admitting: Hematology and Oncology

## 2015-05-04 ENCOUNTER — Ambulatory Visit
Admission: RE | Admit: 2015-05-04 | Discharge: 2015-05-04 | Disposition: A | Discharge status: Home / Self Care | Payer: BLUE CROSS/BLUE SHIELD | Source: Ambulatory Visit | Attending: Radiation Oncology | Admitting: Radiation Oncology

## 2015-05-04 ENCOUNTER — Other Ambulatory Visit: Payer: Self-pay

## 2015-05-04 VITALS — BP 143/82 | HR 79 | Temp 98.2°F

## 2015-05-04 DIAGNOSIS — C50412 Malignant neoplasm of upper-outer quadrant of left female breast: Secondary | ICD-10-CM

## 2015-05-04 DIAGNOSIS — R079 Chest pain, unspecified: Secondary | ICD-10-CM | POA: Diagnosis not present

## 2015-05-04 DIAGNOSIS — Z510 Encounter for antineoplastic radiation therapy: Secondary | ICD-10-CM | POA: Diagnosis not present

## 2015-05-04 DIAGNOSIS — K449 Diaphragmatic hernia without obstruction or gangrene: Secondary | ICD-10-CM | POA: Diagnosis not present

## 2015-05-04 LAB — COMPREHENSIVE METABOLIC PANEL
ALBUMIN: 3.7 g/dL (ref 3.5–5.0)
ALK PHOS: 97 U/L (ref 38–126)
ALT: 19 U/L (ref 14–54)
ANION GAP: 5 (ref 5–15)
AST: 19 U/L (ref 15–41)
BUN: 12 mg/dL (ref 6–20)
CHLORIDE: 105 mmol/L (ref 101–111)
CO2: 30 mmol/L (ref 22–32)
Calcium: 9.5 mg/dL (ref 8.9–10.3)
Creatinine, Ser: 0.82 mg/dL (ref 0.44–1.00)
GFR calc Af Amer: >60 mL/min (ref >60)
GFR calc non Af Amer: >60 mL/min (ref >60)
Glucose, Bld: 97 mg/dL (ref 65–99)
POTASSIUM: 3.9 mmol/L (ref 3.5–5.1)
Sodium: 140 mmol/L (ref 135–145)
Total Bilirubin: 0.5 mg/dL (ref 0.3–1.2)
Total Protein: 7.3 g/dL (ref 6.5–8.1)

## 2015-05-04 MED ORDER — IOHEXOL 350 MG/ML SOLN
80.0000 mL | Freq: Once | INTRAVENOUS | Status: AC | PRN
  Administered 2015-05-04: 100 mL via INTRAVENOUS

## 2015-05-04 MED ORDER — HEPARIN SOD (PORK) LOCK FLUSH 100 UNIT/ML IV SOLN
INTRAVENOUS | Status: AC
  Filled 2015-05-04: qty 5

## 2015-05-04 NOTE — Telephone Encounter (Signed)
Patient to have PT appt. On 05-04-15 @ 1 pm @ [facility], patient notified by [facility] [place].

## 2015-05-04 NOTE — Progress Notes (Signed)
Patient to nursing for assessment of increased discoloration, swelling and pain of left arm/chest.Doppler of left upper extremity performed on 05/03/15 was negative for thrombosis.Patient woke up this morning stating arm was purple and that she had bulging veins.Marland Kitchen

## 2015-05-04 NOTE — Telephone Encounter (Signed)
TC from [facility] lab requesting order for CMET for pt prior to CT scan.  Fax'd order to [phone]

## 2015-05-04 NOTE — Progress Notes (Signed)
Weekly Management Note Current Dose:  47 Gy  Projected Dose: 61 Gy   Narrative:  The patient presents for routine under treatment assessment.  CBCT/MVCT images/Port film x-rays were reviewed.  The chart was checked. Julie [name] reports for unscheduled undertreatment visit today. She has had worsening pain at her port site and worsening left upper extremity swelling. Her arm turned blue this morning she states. Her Doppler yesterday morning was negative for clot.  Physical Findings: Weight:  . She has worsening edema of her left arm and blue-ish left arm. Tenderness on palpation to her port site with no erythema.   Impression:  The patient is tolerating radiation.  Plan:  Continue treatment as planned. After discussing with Dr. Barry Dienes and radiology she was transferred up to medical oncology for further investigation.   This document serves as a record of services personally performed by Thea Silversmith, MD. It was created on her behalf by Arlyce Harman, a trained medical scribe. The creation of this record is based on the scribe's personal observations and the provider's statements to them. This document has been checked and approved by the attending provider.

## 2015-05-04 NOTE — Progress Notes (Signed)
Left upper extremity arterial duplex completed.  No evidence of significant plaque or stenosis in the left upper extremity and right subclavian artery.  Triphasic waveforms noted throughout.

## 2015-05-05 ENCOUNTER — Ambulatory Visit
Admission: RE | Admit: 2015-05-05 | Discharge: 2015-05-05 | Disposition: A | Discharge status: Home / Self Care | Payer: BLUE CROSS/BLUE SHIELD | Source: Ambulatory Visit | Attending: Radiation Oncology | Admitting: Radiation Oncology

## 2015-05-05 ENCOUNTER — Ambulatory Visit: Payer: BLUE CROSS/BLUE SHIELD | Attending: Hematology and Oncology | Admitting: Physical Therapy

## 2015-05-05 ENCOUNTER — Ambulatory Visit: Payer: BLUE CROSS/BLUE SHIELD | Admitting: Physical Therapy

## 2015-05-05 DIAGNOSIS — E8989 Other postprocedural endocrine and metabolic complications and disorders: Secondary | ICD-10-CM | POA: Diagnosis not present

## 2015-05-05 DIAGNOSIS — I890 Lymphedema, not elsewhere classified: Secondary | ICD-10-CM | POA: Insufficient documentation

## 2015-05-05 DIAGNOSIS — Z510 Encounter for antineoplastic radiation therapy: Secondary | ICD-10-CM | POA: Diagnosis not present

## 2015-05-05 NOTE — Therapy (Signed)
[place], [place], [address] Phone: [phone]   Fax:  [phone]  Physical Therapy Evaluation  Patient Details  Name: Julie [name] MRN: [id number] Date of Birth: [birth year] Referring Provider:  Nicholas Lose, MD  Encounter Date: 05/05/2015      PT End of Session - 05/05/15 1728    Visit Number 1   Number of Visits 17   Date for PT Re-Evaluation 07/06/15   PT Start Time 8299   PT Stop Time 1603   PT Time Calculation (min) 48 min   Activity Tolerance Patient tolerated treatment well   Behavior During Therapy [facility] for tasks assessed/performed      Past Medical History  Diagnosis Date  . Uterine cyst   . Ovarian cyst   . Irregular menstrual cycle   . Hot flashes   . Breast cancer of upper-outer quadrant of left female breast   . Wears glasses   . PONV (postoperative nausea and vomiting)     severe  . Breast cancer 08/05/14    left breast bx  . Allergy     Past Surgical History  Procedure Laterality Date  . Uterine ablation  2006  . Cesarean section  1991  . Colonoscopy    . Diagnostic laparoscopy  1990    ovarian cysy  . Upper gi endoscopy    . Dilation and curettage of uterus    . Portacath placement Left 08/18/2014    Procedure: INSERTION PORT-A-CATH;  Surgeon: [name], MD;  Location: [place];  Service: General;  Laterality: Left;  . Radioactive seed guided mastectomy with axillary sentinel lymph node biopsy Left 01/18/2015    Procedure: RADIOACTIVE SEED GUIDED PARTIAL MASTECTOMY WITH AXILLARY SENTINEL LYMPH NODE BIOPSY;  Surgeon: [name], MD;  Location: [place];  Service: General;  Laterality: Left;    There were no vitals filed for this visit.  Visit Diagnosis:  Lymphedema of upper extremity following lymphadenectomy      Subjective Assessment - 05/05/15 1518    Subjective sudden onset of swelling in left arm with pain  4 days  ago.  She has had multiple scans to rule out venous and arterial clots.    Pertinent History breast cancer in 2015 with chemo form 08/27/2014-12/14/2014, lumpectomy with 4 nodes removed 01/18/2015, current radiation to be complete july 15  for total of 33 treatments  port on left side and will stay in until [place]    Currently in Pain? Yes   Pain Score 6    Pain Location Arm   Pain Orientation Left   Pain Descriptors / Indicators Aching            [facility] PT Assessment - 05/05/15 0001    Assessment   Medical Diagnosis breast cancer    Onset Date/Surgical Date 01/18/15   Hand Dominance Right   Precautions   Precautions Other (comment)  cancer   Restrictions   Weight Bearing Restrictions No   Balance Screen   Has the patient fallen in the past 6 months Yes   How many times? 1 fell over gardening. Marland Kitchen     Has the patient had a decrease in activity level because of a fear of falling?  Yes  working on balance with the trainer    Is the patient reluctant to leave their home because of a fear of falling?  No  balance issued will not be addressed this session    Home  [place] residence   Living Arrangements Spouse/significant other   Available Help at Discharge Available PRN/intermittently   Prior Function   Level of Independence Independent   Leisure goes to [employer] 2 times a week    Cognition   Overall Cognitive Status Within Functional Limits for tasks assessed   Observation/Other Assessments   Observations redness at skin of left chest.edema obvious in left arm with some darkening of sking   Skin Integrity intact   Observation/Other Assessments-Edema    Edema Circumferential   Sensation   Additional Comments reports numbness in hands and feet   Coordination   Gross Motor Movements are Fluid and Coordinated Yes   Posture/Postural Control   Posture/Postural Control Postural limitations   Postural Limitations Rounded Shoulders;Forward head    AROM   Right Shoulder Flexion 175 Degrees   Right Shoulder ABduction 176 Degrees   Left Shoulder Flexion 168 Degrees   Left Shoulder ABduction 150 Degrees   Strength   Overall Strength Within functional limits for tasks performed  generalized weakness but functionally independent           LYMPHEDEMA/ONCOLOGY QUESTIONNAIRE - 05/05/15 1540    Right Upper Extremity Lymphedema   15 cm Proximal to Olecranon Process 34 cm   10 cm Proximal to Olecranon Process 33 cm   Olecranon Process 25 cm   15 cm Proximal to Ulnar Styloid Process 25.5 cm   10 cm Proximal to Ulnar Styloid Process 23 cm   Just Proximal to Ulnar Styloid Process 16 cm   Across Hand at [employer] 18.5 cm   At [place] of 2nd Digit 5.5 cm   Left Upper Extremity Lymphedema   15 cm Proximal to Olecranon Process 36.5 cm   10 cm Proximal to Olecranon Process 34.5 cm   Olecranon Process 27 cm   15 cm Proximal to Ulnar Styloid Process 26.5 cm   10 cm Proximal to Ulnar Styloid Process 24.5 cm   Just Proximal to Ulnar Styloid Process 17.6 cm   Across Hand at [employer] 19.5 cm   At [place] of 2nd Digit 5.7 cm           Quick Dash - 05/05/15 0001    Open a tight or new jar Moderate difficulty   Do heavy household chores (wash walls, wash floors) Unable   Carry a shopping bag or briefcase Mild difficulty   Wash your back Mild difficulty   Use a knife to cut food No difficulty   Recreational activities in which you take some force or impact through your arm, shoulder, or hand (golf, hammering, tennis) Mild difficulty   During the past week, to what extent has your arm, shoulder or hand problem interfered with your normal social activities with family, friends, neighbors, or groups? Quite a bit   During the past week, to what extent has your arm, shoulder or hand problem limited your work or other regular daily activities Quite a bit   Arm, shoulder, or hand pain. Severe   Tingling (pins and needles) in your arm,  shoulder, or hand Severe   Difficulty Sleeping Moderate difficulty   DASH Score 52.27 %             [facility] Adult PT Treatment/Exercise - 05/05/15 0001    Self-Care   Other Self-Care Comments  use of Tg soft (size medium) for symptom management of swollen arm    Shoulder Exercises: Supine   Other Supine Exercises  Left UE remedial exercise with elevation    Manual Therapy   Manual Lymphatic Drainage (MLD) brief session with ongoing instruction: short neck, superficial and deep abdominals, right axilla, anterior interaxially anastamosis avoinding painful port on left side, left ingunal, left axillo-inguinal anastamosis, left shoulder arm, to hand  with return along pathways                 PT Education - 05/05/15 1728    Education provided Yes   Education Details brief iintroduction to self manual lymph drainage. issued copy of [place] training DVD for her to borrow   Person(s) Educated Patient   Methods Explanation;Demonstration   Comprehension Verbalized understanding;Need further instruction;Returned demonstration           Short Term Clinic Goals - 05/05/15 1742    CC Short Term Goal  #1   Title Patient with verbalize an understanding of lymphedema risk reduction precautions   Time 4   Period Weeks   Status New   CC Short Term Goal  #2   Title Patient will have a circumferential reduction of       .5    cm at      cm above the    10     ulnar styloid    Baseline 24.5             [facility] Goals - 05/05/15 1744    CC Long Term Goal  #1   Title Patient will have a circumferential reduction of      1     cm at    10  cm above the         ulnar styloid    Baseline 24.5   Time 8   Period Weeks   Status New   CC Long Term Goal  #2   Title Patient will be independent in self manual lymph drainage   Time 8   Period Weeks   Status New   CC Long Term Goal  #3   Title Patient will be know how to obtain and use compression garments for maintenance phase  of treatment   Time 8   Period Weeks   Status New   CC Long Term Goal  #4   Title Patient will decrease the DASH score to <30    to demonstrate increased functional use of upper extremity   Baseline 52.27            Plan - 05/05/15 1730    Clinical Impression Statement pt with chemo, lumpectomy with 4 nodes removed, nearing the end of her radiation treatment  with sudden onset of lymphedema in left arm    Pt will benefit from skilled therapeutic intervention in order to improve on the following deficits Decreased range of motion;Increased edema;Pain   Rehab Potential Excellent   Clinical Impairments Affecting Rehab Potential past chemo with generalized weakness and neuopathy symptoms    PT Frequency 2x / week  up to 3 tmes a week if she chooses to do bandaging   PT Duration 8 weeks   PT Treatment/Interventions ADLs/Self Care Home Management;DME Instruction;Manual techniques;Therapeutic activities;Manual lymph drainage;Compression bandaging;Therapeutic exercise;Patient/family education   PT Next Visit Plan assess effectiveness of tg soft and reduce to smaller size for forearm if needed, manual lymph drainage with self instruction   PT Home Exercise Plan lymphatic remedical exercise         Problem List Patient Active Problem List   Diagnosis Date Noted  .  Peripheral edema 01/11/2015  . Thrombocytopenia 01/08/2015  . Genetic testing 12/29/2014  . Hypokalemia 12/20/2014  . Antineoplastic chemotherapy induced anemia 12/20/2014  . Dehydration 11/11/2014  . Nausea without vomiting 11/11/2014  . Neutropenia 11/11/2014  . Syncope 11/11/2014  . Mucositis due to chemotherapy 09/03/2014  . Diarrhea 09/03/2014  . Vaginal yeast infection 09/03/2014  . Breast cancer of upper-outer quadrant of left female breast 08/09/2014   Donato Heinz. Owens Shark, PT    05/05/2015, 5:48 PM  [place], [place],  [address] Phone: [phone]   Fax:  [phone]

## 2015-05-05 NOTE — Patient Instructions (Signed)
Do not allow Tg soft to roll up or bind arm Take it off if it becomes uncomfortable

## 2015-05-06 ENCOUNTER — Ambulatory Visit
Admission: RE | Admit: 2015-05-06 | Discharge: 2015-05-06 | Disposition: A | Discharge status: Home / Self Care | Payer: BLUE CROSS/BLUE SHIELD | Source: Ambulatory Visit | Attending: Radiation Oncology | Admitting: Radiation Oncology

## 2015-05-06 DIAGNOSIS — Z510 Encounter for antineoplastic radiation therapy: Secondary | ICD-10-CM | POA: Diagnosis not present

## 2015-05-09 ENCOUNTER — Ambulatory Visit
Admission: RE | Admit: 2015-05-09 | Discharge: 2015-05-09 | Disposition: A | Discharge status: Home / Self Care | Payer: BLUE CROSS/BLUE SHIELD | Source: Ambulatory Visit | Attending: Radiation Oncology | Admitting: Radiation Oncology

## 2015-05-09 DIAGNOSIS — Z510 Encounter for antineoplastic radiation therapy: Secondary | ICD-10-CM | POA: Diagnosis not present

## 2015-05-10 ENCOUNTER — Ambulatory Visit
Admission: RE | Admit: 2015-05-10 | Discharge: 2015-05-10 | Disposition: A | Discharge status: Home / Self Care | Payer: BLUE CROSS/BLUE SHIELD | Source: Ambulatory Visit | Attending: Radiation Oncology | Admitting: Radiation Oncology

## 2015-05-10 ENCOUNTER — Ambulatory Visit: Payer: BLUE CROSS/BLUE SHIELD

## 2015-05-10 ENCOUNTER — Ambulatory Visit: Payer: BLUE CROSS/BLUE SHIELD | Admitting: Physical Therapy

## 2015-05-10 VITALS — BP 121/77 | HR 75 | Temp 98.3°F

## 2015-05-10 DIAGNOSIS — I890 Lymphedema, not elsewhere classified: Secondary | ICD-10-CM

## 2015-05-10 DIAGNOSIS — E8989 Other postprocedural endocrine and metabolic complications and disorders: Secondary | ICD-10-CM

## 2015-05-10 DIAGNOSIS — Z510 Encounter for antineoplastic radiation therapy: Secondary | ICD-10-CM | POA: Diagnosis not present

## 2015-05-10 DIAGNOSIS — C50412 Malignant neoplasm of upper-outer quadrant of left female breast: Secondary | ICD-10-CM

## 2015-05-10 NOTE — Progress Notes (Signed)
Weekly Management Note Current Dose: 54  Gy  Projected Dose: 61 Gy   Narrative:  The patient presents for routine under treatment assessment.  CBCT/MVCT images/Port film x-rays were reviewed.  The chart was checked. Doing well. Arm feels better post PT. Appointments set up for next week.   Physical Findings: Weight:  . Arm is less blue. Collaterals on back and front of arm.   Impression:  The patient is tolerating radiation.  Plan:  Continue treatment as planned. Follow up in 1 month. Schedule given. Remove port if possible.

## 2015-05-10 NOTE — Progress Notes (Signed)
Patient has completed 30 of 33 treatments.Skin is discolored without peeling.Lymphedema of left arm is not as bad.wearing sleeve.Physical therapy to start on regular basis next week.

## 2015-05-10 NOTE — Therapy (Signed)
[place], [place], [address] Phone: [phone]   Fax:  [phone]  Physical Therapy Treatment  Patient Details  Name: Julie [name] MRN: [id number] Date of Birth: 22-Dec-1955 Referring Provider:  Nicholas Lose, MD  Encounter Date: 05/10/2015      PT End of Session - 05/10/15 1518    Visit Number 2   Number of Visits 17   Date for PT Re-Evaluation 07/06/15   PT Start Time 9179   PT Stop Time 1515   PT Time Calculation (min) 42 min   Activity Tolerance Patient tolerated treatment well   Behavior During Therapy [facility] for tasks assessed/performed      Past Medical History  Diagnosis Date  . Uterine cyst   . Ovarian cyst   . Irregular menstrual cycle   . Hot flashes   . Breast cancer of upper-outer quadrant of left female breast   . Wears glasses   . PONV (postoperative nausea and vomiting)     severe  . Breast cancer 08/05/14    left breast bx  . Allergy     Past Surgical History  Procedure Laterality Date  . Uterine ablation  2006  . Cesarean section  1991  . Colonoscopy    . Diagnostic laparoscopy  1990    ovarian cysy  . Upper gi endoscopy    . Dilation and curettage of uterus    . Portacath placement Left 08/18/2014    Procedure: INSERTION PORT-A-CATH;  Surgeon: [name], MD;  Location: [place];  Service: General;  Laterality: Left;  . Radioactive seed guided mastectomy with axillary sentinel lymph node biopsy Left 01/18/2015    Procedure: RADIOACTIVE SEED GUIDED PARTIAL MASTECTOMY WITH AXILLARY SENTINEL LYMPH NODE BIOPSY;  Surgeon: [name], MD;  Location: [place];  Service: General;  Laterality: Left;    There were no vitals filed for this visit.  Visit Diagnosis:  Lymphedema of upper extremity following lymphadenectomy      Subjective Assessment - 05/10/15 1436    Subjective Arm gets achy by the end of the day; tylenol helps.  The  TG soft helps and can sleep better.  Spoke to her Physiological scientist and he has backed off on arm weights for now.  Has watched the DVD and had family members watch it, and has tried it, but hasn't noticed a difference in her arm.                                                  Currently in Pain? No/denies               LYMPHEDEMA/ONCOLOGY QUESTIONNAIRE - 05/10/15 1443    Left Upper Extremity Lymphedema   15 cm Proximal to Olecranon Process 35 cm   10 cm Proximal to Olecranon Process 33.5 cm   Olecranon Process 26 cm   15 cm Proximal to Ulnar Styloid Process 27.3 cm   10 cm Proximal to Ulnar Styloid Process 25.3 cm   Just Proximal to Ulnar Styloid Process 19 cm   Across Hand at [employer] 19 cm   At [place] of 2nd Digit 5.6 cm                  [facility] Adult PT Treatment/Exercise - 05/10/15 0001    Manual  Therapy   Manual Therapy Edema management   Edema Management cut a piece of size small TG soft and applied it to patient's left arm; fit is fine, snug but not uncomfortable; circumference measurements taken of left arm   Manual Lymphatic Drainage (MLD) Diaphragmatic breathing, then short neck, right axilla and anterior interaxillary anastomosis, left groin and axillo-inguinal anastomosis, and left UE from fingers to shoulder.  Patient was given verbal instruction as this was performed, to reinforce and correct what she had tried at home.                   Short Term Clinic Goals - 05/10/15 1521    CC Short Term Goal  #1   Status On-going   CC Short Term Goal  #2   Status On-going             Long Term Clinic Goals - 05/10/15 1522    CC Long Term Goal  #1   Status On-going   CC Long Term Goal  #2   Status On-going   CC Long Term Goal  #3   Status On-going   CC Long Term Goal  #4   Status On-going            Plan - 05/10/15 1518    Clinical Impression Statement Patient had tried manual lymph drainage at home and had used DVD for  guidance, but reported today when therapist did it for her that she had been using too much pressure herself.  Reported that today's session was very helpful to clarify.  She reports she does not want to pursue bandaging at this time.Circumference measurements were reduced at upper arm but larger at forearm today, and patient reported she felt her forearm was bigger. Did some cooking and had hands in dishwater, which may have increased swelling.                                        Pt will benefit from skilled therapeutic intervention in order to improve on the following deficits Decreased range of motion;Increased edema;Pain   Rehab Potential Excellent   Clinical Impairments Affecting Rehab Potential past chemo with generalized weakness and neuopathy symptoms    PT Frequency 2x / week   PT Duration 8 weeks   PT Treatment/Interventions Manual lymph drainage;Manual techniques;Patient/family education   PT Next Visit Plan Assess tolerance of smaller TG soft garment; continue manual lymph drainage and instruction; give handout for self-manual lymph drainage   Consulted and Agree with Plan of Care Patient        Problem List Patient Active Problem List   Diagnosis Date Noted  . Peripheral edema 01/11/2015  . Thrombocytopenia 01/08/2015  . Genetic testing 12/29/2014  . Hypokalemia 12/20/2014  . Antineoplastic chemotherapy induced anemia 12/20/2014  . Dehydration 11/11/2014  . Nausea without vomiting 11/11/2014  . Neutropenia 11/11/2014  . Syncope 11/11/2014  . Mucositis due to chemotherapy 09/03/2014  . Diarrhea 09/03/2014  . Vaginal yeast infection 09/03/2014  . Breast cancer of upper-outer quadrant of left female breast 08/09/2014    [place],Julie [name] 05/10/2015, 3:23 PM  [place], [place], [address] Phone: [phone]   Fax:  [place], PT 05/10/2015 3:23 PM

## 2015-05-11 ENCOUNTER — Ambulatory Visit: Payer: BLUE CROSS/BLUE SHIELD

## 2015-05-11 ENCOUNTER — Ambulatory Visit (HOSPITAL_BASED_OUTPATIENT_CLINIC_OR_DEPARTMENT_OTHER): Payer: BLUE CROSS/BLUE SHIELD

## 2015-05-11 ENCOUNTER — Ambulatory Visit
Admission: RE | Admit: 2015-05-11 | Discharge: 2015-05-11 | Disposition: A | Discharge status: Home / Self Care | Payer: BLUE CROSS/BLUE SHIELD | Source: Ambulatory Visit | Attending: Radiation Oncology | Admitting: Radiation Oncology

## 2015-05-11 VITALS — BP 138/81 | HR 79 | Temp 98.3°F | Resp 18

## 2015-05-11 DIAGNOSIS — Z5112 Encounter for antineoplastic immunotherapy: Secondary | ICD-10-CM

## 2015-05-11 DIAGNOSIS — C50412 Malignant neoplasm of upper-outer quadrant of left female breast: Secondary | ICD-10-CM

## 2015-05-11 DIAGNOSIS — Z510 Encounter for antineoplastic radiation therapy: Secondary | ICD-10-CM | POA: Diagnosis not present

## 2015-05-11 MED ORDER — SODIUM CHLORIDE 0.9 % IJ SOLN
10.0000 mL | INTRAMUSCULAR | Status: DC | PRN
  Administered 2015-05-11: 10 mL
  Filled 2015-05-11: qty 10

## 2015-05-11 MED ORDER — SODIUM CHLORIDE 0.9 % IV SOLN
Freq: Once | INTRAVENOUS | Status: AC
  Administered 2015-05-11: 10:00:00 via INTRAVENOUS

## 2015-05-11 MED ORDER — TRASTUZUMAB CHEMO INJECTION 440 MG
4.0000 mg/kg | Freq: Once | INTRAVENOUS | Status: AC
  Administered 2015-05-11: 294 mg via INTRAVENOUS
  Filled 2015-05-11: qty 14

## 2015-05-11 MED ORDER — DIPHENHYDRAMINE HCL 25 MG PO CAPS
ORAL_CAPSULE | ORAL | Status: AC
  Filled 2015-05-11: qty 2

## 2015-05-11 MED ORDER — HEPARIN SOD (PORK) LOCK FLUSH 100 UNIT/ML IV SOLN
500.0000 [IU] | Freq: Once | INTRAVENOUS | Status: AC | PRN
  Administered 2015-05-11: 500 [IU]
  Filled 2015-05-11: qty 5

## 2015-05-11 MED ORDER — ACETAMINOPHEN 325 MG PO TABS
ORAL_TABLET | ORAL | Status: AC
  Filled 2015-05-11: qty 2

## 2015-05-11 MED ORDER — DIPHENHYDRAMINE HCL 25 MG PO CAPS
50.0000 mg | ORAL_CAPSULE | Freq: Once | ORAL | Status: AC
  Administered 2015-05-11: 50 mg via ORAL

## 2015-05-11 MED ORDER — ACETAMINOPHEN 325 MG PO TABS
650.0000 mg | ORAL_TABLET | Freq: Once | ORAL | Status: AC
  Administered 2015-05-11: 650 mg via ORAL

## 2015-05-11 NOTE — Patient Instructions (Signed)
[facility] Discharge Instructions for Patients Receiving Chemotherapy  Today you received the following chemotherapy agents Herceptin  To help prevent nausea and vomiting after your treatment, we encourage you to take your nausea medication Compazine 10 mg every 6 hours as needed.  If you develop nausea and vomiting that is not controlled by your nausea medication, call the clinic.   BELOW ARE SYMPTOMS THAT SHOULD BE REPORTED IMMEDIATELY:  *FEVER GREATER THAN 100.5 F  *CHILLS WITH OR WITHOUT FEVER  NAUSEA AND VOMITING THAT IS NOT CONTROLLED WITH YOUR NAUSEA MEDICATION  *UNUSUAL SHORTNESS OF BREATH  *UNUSUAL BRUISING OR BLEEDING  TENDERNESS IN MOUTH AND THROAT WITH OR WITHOUT PRESENCE OF ULCERS  *URINARY PROBLEMS  *BOWEL PROBLEMS  UNUSUAL RASH Items with * indicate a potential emergency and should be followed up as soon as possible.  Feel free to call the clinic you have any questions or concerns. The clinic phone number is [phone].  Please show the [place] at check-in to the Emergency Department and triage nurse.

## 2015-05-11 NOTE — Progress Notes (Signed)
Per Terri, Dr. Geralyn Flash RN, okay to proceed with Herceptin today without recent labs. Will add labs onto next appointment time.  Patient notified and verbalized understanding.

## 2015-05-12 ENCOUNTER — Ambulatory Visit: Payer: BLUE CROSS/BLUE SHIELD

## 2015-05-12 ENCOUNTER — Ambulatory Visit
Admission: RE | Admit: 2015-05-12 | Discharge: 2015-05-12 | Disposition: A | Discharge status: Home / Self Care | Payer: BLUE CROSS/BLUE SHIELD | Source: Ambulatory Visit | Attending: Radiation Oncology | Admitting: Radiation Oncology

## 2015-05-12 DIAGNOSIS — Z510 Encounter for antineoplastic radiation therapy: Secondary | ICD-10-CM | POA: Diagnosis not present

## 2015-05-13 ENCOUNTER — Ambulatory Visit
Admission: RE | Admit: 2015-05-13 | Discharge: 2015-05-13 | Disposition: A | Discharge status: Home / Self Care | Payer: BLUE CROSS/BLUE SHIELD | Source: Ambulatory Visit | Attending: Radiation Oncology | Admitting: Radiation Oncology

## 2015-05-13 ENCOUNTER — Encounter: Payer: Self-pay | Admitting: Radiation Oncology

## 2015-05-13 ENCOUNTER — Ambulatory Visit: Payer: BLUE CROSS/BLUE SHIELD

## 2015-05-13 DIAGNOSIS — Z510 Encounter for antineoplastic radiation therapy: Secondary | ICD-10-CM | POA: Diagnosis not present

## 2015-05-14 ENCOUNTER — Ambulatory Visit: Payer: BLUE CROSS/BLUE SHIELD

## 2015-05-17 ENCOUNTER — Ambulatory Visit: Payer: BLUE CROSS/BLUE SHIELD

## 2015-05-17 DIAGNOSIS — E8989 Other postprocedural endocrine and metabolic complications and disorders: Secondary | ICD-10-CM | POA: Diagnosis not present

## 2015-05-17 DIAGNOSIS — I890 Lymphedema, not elsewhere classified: Secondary | ICD-10-CM

## 2015-05-17 NOTE — Therapy (Signed)
[place], [place], [address] Phone: [phone]   Fax:  [phone]  Physical Therapy Treatment  Patient Details  Name: Julie [name] MRN: [id number] Date of Birth: Mar 19, 1956 Referring Provider:  Nicholas Lose, MD  Encounter Date: 05/17/2015      PT End of Session - 05/17/15 1108    Visit Number 3   Number of Visits 17   Date for PT Re-Evaluation 07/06/15   PT Start Time 1025   PT Stop Time 1107   PT Time Calculation (min) 42 min   Activity Tolerance Patient tolerated treatment well   Behavior During Therapy [facility] for tasks assessed/performed      Past Medical History  Diagnosis Date  . Uterine cyst   . Ovarian cyst   . Irregular menstrual cycle   . Hot flashes   . Breast cancer of upper-outer quadrant of left female breast   . Wears glasses   . PONV (postoperative nausea and vomiting)     severe  . Breast cancer 08/05/14    left breast bx  . Allergy     Past Surgical History  Procedure Laterality Date  . Uterine ablation  2006  . Cesarean section  1991  . Colonoscopy    . Diagnostic laparoscopy  1990    ovarian cysy  . Upper gi endoscopy    . Dilation and curettage of uterus    . Portacath placement Left 08/18/2014    Procedure: INSERTION PORT-A-CATH;  Surgeon: [name], MD;  Location: [place];  Service: General;  Laterality: Left;  . Radioactive seed guided mastectomy with axillary sentinel lymph node biopsy Left 01/18/2015    Procedure: RADIOACTIVE SEED GUIDED PARTIAL MASTECTOMY WITH AXILLARY SENTINEL LYMPH NODE BIOPSY;  Surgeon: [name], MD;  Location: [place];  Service: General;  Laterality: Left;    There were no vitals filed for this visit.  Visit Diagnosis:  Lymphedema of upper extremity following lymphadenectomy      Subjective Assessment - 05/17/15 1029    Subjective The massge is really helping and the smaller sleeve  feels good now that my arm is smaller. Not having any pain now!   Currently in Pain? No/denies               LYMPHEDEMA/ONCOLOGY QUESTIONNAIRE - 05/17/15 1029    Left Upper Extremity Lymphedema   15 cm Proximal to Olecranon Process 34.2 cm   10 cm Proximal to Olecranon Process 31.9 cm   Olecranon Process 25.4 cm   15 cm Proximal to Ulnar Styloid Process 25.6 cm   10 cm Proximal to Ulnar Styloid Process 23.4 cm   Just Proximal to Ulnar Styloid Process 16.8 cm   Across Hand at [employer] 18.6 cm   At [place] of 2nd Digit 5.8 cm                  [facility] Adult PT Treatment/Exercise - 05/17/15 0001    Manual Therapy   Edema Management Issued another TG soft size small sleeve   Manual Lymphatic Drainage (MLD) Diaphragmatic breathing, then short neck, right axilla and anterior interaxillary anastomosis, left groin and axillo-inguinal anastomosis, and left UE from fingers to shoulder.  Patient was able to verbalize sequence before beginning treatment today and used correct pressure when assessed throughout.                    Short Term Clinic Goals - 05/10/15  52    CC Short Term Goal  #1   Status On-going   CC Short Term Goal  #2   Status On-going             Long Term Clinic Goals - 05/10/15 1522    CC Long Term Goal  #1   Status On-going   CC Long Term Goal  #2   Status On-going   CC Long Term Goal  #3   Status On-going   CC Long Term Goal  #4   Status On-going            Plan - 05/17/15 1108    Clinical Impression Statement Patient is already making very good progress. Her circumferential measurements were reduced from last week and she is wearing the TG soft and reports this is helping. Patient has also been keeping her hands out of hot water with allowing her family members to wash dishes for her now as well.    Pt will benefit from skilled therapeutic intervention in order to improve on the following deficits Decreased range of  motion;Increased edema;Pain   Rehab Potential Excellent   Clinical Impairments Affecting Rehab Potential past chemo with generalized weakness and neuopathy symptoms    PT Frequency 2x / week   PT Duration 8 weeks   PT Treatment/Interventions Manual lymph drainage;Manual techniques;Patient/family education   PT Next Visit Plan Continue manual lymph drainage and instruction; give handout for self-manual lymph drainage   Consulted and Agree with Plan of Care Patient        Problem List Patient Active Problem List   Diagnosis Date Noted  . Peripheral edema 01/11/2015  . Thrombocytopenia 01/08/2015  . Genetic testing 12/29/2014  . Hypokalemia 12/20/2014  . Antineoplastic chemotherapy induced anemia 12/20/2014  . Dehydration 11/11/2014  . Nausea without vomiting 11/11/2014  . Neutropenia 11/11/2014  . Syncope 11/11/2014  . Mucositis due to chemotherapy 09/03/2014  . Diarrhea 09/03/2014  . Vaginal yeast infection 09/03/2014  . Breast cancer of upper-outer quadrant of left female breast 08/09/2014    Otelia Limes, PTA 05/17/2015, 11:10 AM  [place], [place], [address] Phone: [phone]   Fax:  [phone]

## 2015-05-19 ENCOUNTER — Encounter: Payer: Self-pay | Admitting: Physical Therapy

## 2015-05-19 ENCOUNTER — Ambulatory Visit: Payer: BLUE CROSS/BLUE SHIELD | Admitting: Physical Therapy

## 2015-05-19 DIAGNOSIS — E8989 Other postprocedural endocrine and metabolic complications and disorders: Secondary | ICD-10-CM | POA: Diagnosis not present

## 2015-05-19 DIAGNOSIS — I890 Lymphedema, not elsewhere classified: Secondary | ICD-10-CM

## 2015-05-19 NOTE — Therapy (Signed)
[place], [place], [address] Phone: [phone]   Fax:  [phone]  Physical Therapy Treatment  Patient Details  Name: Julie [name] MRN: [id number] Date of Birth: Jun 02, 1956 Referring Provider:  Nicholas Lose, MD  Encounter Date: 05/19/2015      PT End of Session - 05/19/15 1625    Visit Number 4   Number of Visits 17   Date for PT Re-Evaluation 07/06/15   PT Start Time 1430   PT Stop Time 1515   PT Time Calculation (min) 45 min   Activity Tolerance Patient tolerated treatment well   Behavior During Therapy [facility] for tasks assessed/performed      Past Medical History  Diagnosis Date  . Uterine cyst   . Ovarian cyst   . Irregular menstrual cycle   . Hot flashes   . Breast cancer of upper-outer quadrant of left female breast   . Wears glasses   . PONV (postoperative nausea and vomiting)     severe  . Breast cancer 08/05/14    left breast bx  . Allergy     Past Surgical History  Procedure Laterality Date  . Uterine ablation  2006  . Cesarean section  1991  . Colonoscopy    . Diagnostic laparoscopy  1990    ovarian cysy  . Upper gi endoscopy    . Dilation and curettage of uterus    . Portacath placement Left 08/18/2014    Procedure: INSERTION PORT-A-CATH;  Surgeon: [name], MD;  Location: [place];  Service: General;  Laterality: Left;  . Radioactive seed guided mastectomy with axillary sentinel lymph node biopsy Left 01/18/2015    Procedure: RADIOACTIVE SEED GUIDED PARTIAL MASTECTOMY WITH AXILLARY SENTINEL LYMPH NODE BIOPSY;  Surgeon: [name], MD;  Location: [place];  Service: General;  Laterality: Left;    There were no vitals filed for this visit.  Visit Diagnosis:  Lymphedema of upper extremity following lymphadenectomy      Subjective Assessment - 05/19/15 1437    Subjective I think my arm is doing alot better.  No complaints.   It's still swollen but my pain is better.   Pertinent History breast cancer in 2015 with chemo from 08/27/2014-12/14/2014, lumpectomy with 4 nodes removed 01/18/2015, current radiation to be completed July 15 for total of 33 treatments. Port on left side and will stay in until October    Patient Stated Goals Decrease swelling   Currently in Pain? No/denies           [facility] Adult PT Treatment/Exercise - 05/19/15 0001    Manual Therapy   Manual Therapy Edema management   Edema Management Discussed use fo a compression sleeve and gauntlet for better swelling control; gave her [employer] brochure as she was interested in those after being shown various samples.  She was educated on where she may purchase a garment and verbalized understanding.   Manual Lymphatic Drainage (MLD) Diaphragmatic breathing, then short neck, right axilla and anterior interaxillary anastomosis, left groin and axillo-inguinal anastomosis, and left UE from fingers to shoulder, redirecting along pathways.           Short Term Clinic Goals - 05/19/15 1630    CC Short Term Goal  #1   Title Patient with verbalize an understanding of lymphedema risk reduction precautions   Time 4   Period Weeks   Status On-going   CC Short Term Goal  #2   Title Patient will  have a circumferential reduction of       .5    cm at      cm above the    10     ulnar styloid    Time 4   Period Weeks   Status Achieved             Long Term Clinic Goals - 05/19/15 1629    CC Long Term Goal  #1   Title Patient will have a circumferential reduction of      1     cm at    10  cm above the         ulnar styloid    Time 8   Period Weeks   Status Achieved   CC Long Term Goal  #2   Title Patient will be independent in self manual lymph drainage   Time 8   Period Weeks   Status On-going   CC Long Term Goal  #3   Title Patient will be know how to obtain and use compression garments for maintenance phase of treatment   Time 8   Period  Weeks   Status Partially Met   CC Long Term Goal  #4   Title Patient will decrease the DASH score to <30    to demonstrate increased functional use of upper extremity   Time 8   Period Weeks   Status On-going            Plan - 05/19/15 1625    Clinical Impression Statement Patient is very pleased with her progress and her arm appears to be doing very well with significantly decreased swelling.  She will benefit from a compression sleeve and glove and we began conversation of that today.  She is going to explore the L[employer]ivas garments online.  She will benefit from continued treatment to further reduce her arm.   Pt will benefit from skilled therapeutic intervention in order to improve on the following deficits Decreased range of motion;Increased edema;Pain   Rehab Potential Excellent   Clinical Impairments Affecting Rehab Potential past chemo with generalized weakness and neuopathy symptoms    PT Frequency 2x / week   PT Duration 8 weeks   PT Treatment/Interventions Manual lymph drainage;Manual techniques;Patient/family education   PT Next Visit Plan Continue manual lymph drainage and instruction; give handout for self-manual lymph drainage   Consulted and Agree with Plan of Care Patient        Problem List Patient Active Problem List   Diagnosis Date Noted  . Peripheral edema 01/11/2015  . Thrombocytopenia 01/08/2015  . Genetic testing 12/29/2014  . Hypokalemia 12/20/2014  . Antineoplastic chemotherapy induced anemia 12/20/2014  . Dehydration 11/11/2014  . Nausea without vomiting 11/11/2014  . Neutropenia 11/11/2014  . Syncope 11/11/2014  . Mucositis due to chemotherapy 09/03/2014  . Diarrhea 09/03/2014  . Vaginal yeast infection 09/03/2014  . Breast cancer of upper-outer quadrant of left female breast 08/09/2014    Annia Friendly, PT 05/19/2015, 4:32 PM  [place], [place],  [address] Phone: [phone]   Fax:  [phone]

## 2015-05-22 NOTE — Progress Notes (Signed)
Name: Julie [name]   MRN: [id number]  Date:  04/19/2015   DOB: [birth year]  Status:outpatient    DIAGNOSIS: Breast cancer of upper-outer quadrant of left female breast   Staging form: Breast, AJCC 7th Edition     Clinical: Stage IIA (T2, N0, cM0) - Unsigned       Staging comments: Staged at breast conference 08/11/14.      Pathologic stage from 01/20/2015: Stage Unknown (yTX, N0, cM0) - Signed by Enid Cutter, MD on 01/25/2015       Staging comments: Staged on final lumpectomy specimen by Dr. Donato Heinz    CONSENT VERIFIED: yes   SET UP: Patient is setup supine   IMMOBILIZATION:  The following immobilization was used:Custom Moldable Pillow, breast board.   NARRATIVE: Julie [name] underwent complex simulation and treatment planning for her boost treatment today.  Her tumor volume was outlined on the planning CT scan. The depth of her cavity was felt to be appropriate for treatment with electrons    9  MeV electrons will be prescribed to the 100%  isodose line.   I personally oversaw and approved the construction of a unique block which will be used for beam modification purposes.  An isodose plan is requested.

## 2015-05-22 NOTE — Progress Notes (Signed)
  Radiation Oncology         [phone] ________________________________  Name: Julie [name] MRN: [id number]  Date: 05/13/2015  DOB: April 09, 1956  End of Treatment Note  Diagnosis:   Breast cancer of upper-outer quadrant of left female breast   Staging form: Breast, AJCC 7th Edition     Clinical: Stage IIA (T2, N0, cM0) - Unsigned       Staging comments: Staged at breast conference 08/11/14.      Pathologic stage from 01/20/2015: Stage Unknown (yTX, N0, cM0) - Signed by Enid Cutter, MD on 01/25/2015       Staging comments: Staged on final lumpectomy specimen by Dr. Donato Heinz  Indication for treatment:  Curative      Radiation treatment dates:   5/31-7/15/16  Site/dose:   Left breast/ 45 Gy at 1.8 Gy per fraction x 25 fractions.  Left breast boost/ 16 Gy at 2 Gy per fraction x 8 fractions  Beams/energy:  Opposed tangents with reduced fields / 6 MV photons Enface electrons / 9 MeV electrons  Narrative: The patient tolerated radiation treatment relatively well.   She unfortunately developed sudden onset of swelling of her left arm. A workup for clot was negative.  She was evaluated by physical therapy and was feeling better after those treatments.   Plan: The patient has completed radiation treatment. The patient will return to radiation oncology clinic for routine followup in one month. I advised them to call or return sooner if they have any questions or concerns related to their recovery or treatment.  ------------------------------------------------  Thea Silversmith, MD

## 2015-05-23 ENCOUNTER — Ambulatory Visit: Payer: BLUE CROSS/BLUE SHIELD | Admitting: Physical Therapy

## 2015-05-23 ENCOUNTER — Encounter: Payer: Self-pay | Admitting: Physical Therapy

## 2015-05-23 DIAGNOSIS — E8989 Other postprocedural endocrine and metabolic complications and disorders: Secondary | ICD-10-CM | POA: Diagnosis not present

## 2015-05-23 DIAGNOSIS — I890 Lymphedema, not elsewhere classified: Secondary | ICD-10-CM

## 2015-05-23 NOTE — Therapy (Signed)
[place], [place], [address] Phone: [phone]   Fax:  [phone]  Physical Therapy Treatment  Patient Details  Name: Julie [name] MRN: [id number] Date of Birth: July 14, 1956 Referring Provider:  Nicholas Lose, MD  Encounter Date: 05/23/2015      PT End of Session - 05/23/15 1312    Visit Number 5   Number of Visits 17   Date for PT Re-Evaluation 07/06/15   PT Start Time 1300   PT Stop Time 1346   PT Time Calculation (min) 46 min   Activity Tolerance Patient tolerated treatment well   Behavior During Therapy [facility] for tasks assessed/performed      Past Medical History  Diagnosis Date  . Uterine cyst   . Ovarian cyst   . Irregular menstrual cycle   . Hot flashes   . Breast cancer of upper-outer quadrant of left female breast   . Wears glasses   . PONV (postoperative nausea and vomiting)     severe  . Breast cancer 08/05/14    left breast bx  . Allergy     Past Surgical History  Procedure Laterality Date  . Uterine ablation  2006  . Cesarean section  1991  . Colonoscopy    . Diagnostic laparoscopy  1990    ovarian cysy  . Upper gi endoscopy    . Dilation and curettage of uterus    . Portacath placement Left 08/18/2014    Procedure: INSERTION PORT-A-CATH;  Surgeon: [name], MD;  Location: [place];  Service: General;  Laterality: Left;  . Radioactive seed guided mastectomy with axillary sentinel lymph node biopsy Left 01/18/2015    Procedure: RADIOACTIVE SEED GUIDED PARTIAL MASTECTOMY WITH AXILLARY SENTINEL LYMPH NODE BIOPSY;  Surgeon: [name], MD;  Location: [place];  Service: General;  Laterality: Left;    There were no vitals filed for this visit.  Visit Diagnosis:  Lymphedema of upper extremity following lymphadenectomy      Subjective Assessment - 05/23/15 1304    Subjective I want to get the Lymphedivas sleeve and gauntlet. I've  been doing the massage and avoiding washing dishes in hot water, wearing gloves with gardening.   Pertinent History breast cancer in 2015 with chemo from 08/27/2014-12/14/2014, lumpectomy with 4 nodes removed 01/18/2015, current radiation to be completed July 15 for total of 33 treatments. Port on left side and will stay in until October    Patient Stated Goals Decrease swelling   Currently in Pain? No/denies               LYMPHEDEMA/ONCOLOGY QUESTIONNAIRE - 05/23/15 1307    Left Upper Extremity Lymphedema   15 cm Proximal to Olecranon Process 34.2 cm   10 cm Proximal to Olecranon Process 32.5 cm   Olecranon Process 25.1 cm   15 cm Proximal to Ulnar Styloid Process 25.5 cm   10 cm Proximal to Ulnar Styloid Process 23.2 cm   Just Proximal to Ulnar Styloid Process 16.9 cm   Across Hand at [employer] 18.2 cm   At [place] of 2nd Digit 5.6 cm                  [facility] Adult PT Treatment/Exercise - 05/23/15 0001    Manual Therapy   Manual Therapy Manual Lymphatic Drainage (MLD)   Manual Lymphatic Drainage (MLD) Diaphragmatic breathing, then short neck, right axilla and anterior interaxillary anastomosis, left groin and axillo-inguinal anastomosis, and left  UE from fingers to shoulder, redirecting along pathways.                   Short Term Clinic Goals - 05/19/15 1630    CC Short Term Goal  #1   Title Patient with verbalize an understanding of lymphedema risk reduction precautions   Time 4   Period Weeks   Status On-going   CC Short Term Goal  #2   Title Patient will have a circumferential reduction of       .5    cm at      cm above the    10     ulnar styloid    Time 4   Period Weeks   Status Achieved             Long Term Clinic Goals - 05/19/15 1629    CC Long Term Goal  #1   Title Patient will have a circumferential reduction of      1     cm at    10  cm above the         ulnar styloid    Time 8   Period Weeks   Status Achieved   CC Long  Term Goal  #2   Title Patient will be independent in self manual lymph drainage   Time 8   Period Weeks   Status On-going   CC Long Term Goal  #3   Title Patient will be know how to obtain and use compression garments for maintenance phase of treatment   Time 8   Period Weeks   Status Partially Met   CC Long Term Goal  #4   Title Patient will decrease the DASH score to <30    to demonstrate increased functional use of upper extremity   Time 8   Period Weeks   Status On-going            Plan - 05/23/15 1346    Clinical Impression Statement Left arm with swelling still present primarily in her forearm but her hand appears normal.  She is very pleased with her progress.  Order faxed to Dr. Lindi Adie today to get compression sleeve and gauntlet.  She plans to get those from [employer] when the order is returned.  She will benefit from continued PT to further reduce arm.  Measurements taken today demonstrate a consistent reduction each visit.     Pt will benefit from skilled therapeutic intervention in order to improve on the following deficits Decreased range of motion;Increased edema;Pain   Rehab Potential Excellent   Clinical Impairments Affecting Rehab Potential past chemo with generalized weakness and neuopathy symptoms    PT Frequency 2x / week   PT Duration 8 weeks   PT Treatment/Interventions Manual lymph drainage;Manual techniques;Patient/family education   PT Next Visit Plan Continue manual lymph drainage and instruction; give handout for self-manual lymph drainage   Consulted and Agree with Plan of Care Patient        Problem List Patient Active Problem List   Diagnosis Date Noted  . Peripheral edema 01/11/2015  . Thrombocytopenia 01/08/2015  . Genetic testing 12/29/2014  . Hypokalemia 12/20/2014  . Antineoplastic chemotherapy induced anemia 12/20/2014  . Dehydration 11/11/2014  . Nausea without vomiting 11/11/2014  . Neutropenia 11/11/2014  . Syncope  11/11/2014  . Mucositis due to chemotherapy 09/03/2014  . Diarrhea 09/03/2014  . Vaginal yeast infection 09/03/2014  . Breast cancer of upper-outer quadrant of left  female breast 08/09/2014    Annia Friendly, PT 05/23/2015, 1:49 PM  [place], [place], [address] Phone: [phone]   Fax:  [phone]

## 2015-05-25 ENCOUNTER — Ambulatory Visit: Payer: BLUE CROSS/BLUE SHIELD

## 2015-05-25 ENCOUNTER — Telehealth: Payer: Self-pay

## 2015-05-25 DIAGNOSIS — E8989 Other postprocedural endocrine and metabolic complications and disorders: Secondary | ICD-10-CM

## 2015-05-25 DIAGNOSIS — I890 Lymphedema, not elsewhere classified: Secondary | ICD-10-CM

## 2015-05-25 NOTE — Telephone Encounter (Signed)
Faxed order to [facility] for Lymphedema sleeve.  Sent to scan.

## 2015-05-25 NOTE — Patient Instructions (Signed)

## 2015-05-25 NOTE — Therapy (Signed)
[place], [place], [address] Phone: [phone]   Fax:  [phone]  Physical Therapy Treatment  Patient Details  Name: Julie [name] MRN: [id number] Date of Birth: 02/07/1956 Referring Provider:  Nicholas Lose, MD  Encounter Date: 05/25/2015      PT End of Session - 05/25/15 1346    Visit Number 6   Number of Visits 17   Date for PT Re-Evaluation 07/06/15   PT Start Time 1300   PT Stop Time 1346   PT Time Calculation (min) 46 min   Activity Tolerance Patient tolerated treatment well   Behavior During Therapy [facility] for tasks assessed/performed      Past Medical History  Diagnosis Date  . Uterine cyst   . Ovarian cyst   . Irregular menstrual cycle   . Hot flashes   . Breast cancer of upper-outer quadrant of left female breast   . Wears glasses   . PONV (postoperative nausea and vomiting)     severe  . Breast cancer 08/05/14    left breast bx  . Allergy     Past Surgical History  Procedure Laterality Date  . Uterine ablation  2006  . Cesarean section  1991  . Colonoscopy    . Diagnostic laparoscopy  1990    ovarian cysy  . Upper gi endoscopy    . Dilation and curettage of uterus    . Portacath placement Left 08/18/2014    Procedure: INSERTION PORT-A-CATH;  Surgeon: [name], MD;  Location: [place];  Service: General;  Laterality: Left;  . Radioactive seed guided mastectomy with axillary sentinel lymph node biopsy Left 01/18/2015    Procedure: RADIOACTIVE SEED GUIDED PARTIAL MASTECTOMY WITH AXILLARY SENTINEL LYMPH NODE BIOPSY;  Surgeon: [name], MD;  Location: [place];  Service: General;  Laterality: Left;    There were no vitals filed for this visit.  Visit Diagnosis:  Lymphedema of upper extremity following lymphadenectomy      Subjective Assessment - 05/25/15 1305    Subjective I'm very pleased with my progress, the color in my arm  is even improving.    Currently in Pain? No/denies                         [facility] Adult PT Treatment/Exercise - 05/25/15 0001    Manual Therapy   Manual Lymphatic Drainage (MLD) Diaphragmatic breathing, then short neck, right axilla and anterior interaxillary anastomosis, left groin and axillo-inguinal anastomosis, and left UE from fingers to shoulder, redirecting along pathways.                PT Education - 05/25/15 1308    Education provided Yes   Education Details Self manual lymph drainage   Person(s) Educated Patient   Methods Explanation;Handout   Comprehension Verbalized understanding           Short Term Clinic Goals - 05/19/15 1630    CC Short Term Goal  #1   Title Patient with verbalize an understanding of lymphedema risk reduction precautions   Time 4   Period Weeks   Status On-going   CC Short Term Goal  #2   Title Patient will have a circumferential reduction of       .5    cm at      cm above the    10     ulnar styloid    Time 4  Period Weeks   Status Achieved             Long Term Clinic Goals - 05/25/15 1349    CC Long Term Goal  #2   Title Patient will be independent in self manual lymph drainage   Status Achieved   CC Long Term Goal  #3   Title Patient will be know how to obtain and use compression garments for maintenance phase of treatment   Status Partially Met   CC Long Term Goal  #4   Title Patient will decrease the DASH score to <30    to demonstrate increased functional use of upper extremity   Status On-going            Plan - 05/25/15 1347    Clinical Impression Statement Issued prescription for compression sleeve/gauntlet to pt today and she will going ot get her new compression garments from [employer] soon. She has a very good understanding of the correct sequence of manual lymph drainage and is doing this daily at home. Pt is making good progres though has not met all goals.     Pt will benefit  from skilled therapeutic intervention in order to improve on the following deficits Decreased range of motion;Increased edema;Pain   Rehab Potential Excellent   Clinical Impairments Affecting Rehab Potential past chemo with generalized weakness and neuopathy symptoms    PT Frequency 2x / week   PT Duration 8 weeks   PT Treatment/Interventions Manual lymph drainage;Manual techniques;Patient/family education   PT Next Visit Plan Continue manual lymph drainage and instruction   Consulted and Agree with Plan of Care Patient        Problem List Patient Active Problem List   Diagnosis Date Noted  . Peripheral edema 01/11/2015  . Thrombocytopenia 01/08/2015  . Genetic testing 12/29/2014  . Hypokalemia 12/20/2014  . Antineoplastic chemotherapy induced anemia 12/20/2014  . Dehydration 11/11/2014  . Nausea without vomiting 11/11/2014  . Neutropenia 11/11/2014  . Syncope 11/11/2014  . Mucositis due to chemotherapy 09/03/2014  . Diarrhea 09/03/2014  . Vaginal yeast infection 09/03/2014  . Breast cancer of upper-outer quadrant of left female breast 08/09/2014    Otelia Limes, PTA 05/25/2015, 1:50 PM  [place], [place], [address] Phone: [phone]   Fax:  [phone]

## 2015-05-27 ENCOUNTER — Ambulatory Visit: Payer: BLUE CROSS/BLUE SHIELD | Admitting: Physical Therapy

## 2015-05-30 ENCOUNTER — Ambulatory Visit: Payer: BLUE CROSS/BLUE SHIELD | Admitting: Physical Therapy

## 2015-05-31 ENCOUNTER — Ambulatory Visit: Payer: BLUE CROSS/BLUE SHIELD | Attending: Hematology and Oncology | Admitting: Physical Therapy

## 2015-05-31 DIAGNOSIS — I890 Lymphedema, not elsewhere classified: Secondary | ICD-10-CM | POA: Diagnosis present

## 2015-05-31 DIAGNOSIS — E8989 Other postprocedural endocrine and metabolic complications and disorders: Secondary | ICD-10-CM | POA: Diagnosis present

## 2015-05-31 NOTE — Therapy (Addendum)
[place], [place], [address] Phone: [phone]   Fax:  [phone]  Physical Therapy Treatment  Patient Details  Name: Julie [name] MRN: [id number] Date of Birth: September 18, 1956 Referring Provider:  Nicholas Lose, MD  Encounter Date: 05/31/2015      PT End of Session - 05/31/15 1403    Visit Number 7   Number of Visits 17   Date for PT Re-Evaluation 07/06/15   PT Start Time 1300   PT Stop Time 3790   PT Time Calculation (min) 47 min      Past Medical History  Diagnosis Date  . Uterine cyst   . Ovarian cyst   . Irregular menstrual cycle   . Hot flashes   . Breast cancer of upper-outer quadrant of left female breast   . Wears glasses   . PONV (postoperative nausea and vomiting)     severe  . Breast cancer 08/05/14    left breast bx  . Allergy     Past Surgical History  Procedure Laterality Date  . Uterine ablation  2006  . Cesarean section  1991  . Colonoscopy    . Diagnostic laparoscopy  1990    ovarian cysy  . Upper gi endoscopy    . Dilation and curettage of uterus    . Portacath placement Left 08/18/2014    Procedure: INSERTION PORT-A-CATH;  Surgeon: [name], MD;  Location: [place];  Service: General;  Laterality: Left;  . Radioactive seed guided mastectomy with axillary sentinel lymph node biopsy Left 01/18/2015    Procedure: RADIOACTIVE SEED GUIDED PARTIAL MASTECTOMY WITH AXILLARY SENTINEL LYMPH NODE BIOPSY;  Surgeon: [name], MD;  Location: [place];  Service: General;  Laterality: Left;    There were no vitals filed for this visit.  Visit Diagnosis:  Lymphedema of upper extremity following lymphadenectomy      Subjective Assessment - 05/31/15 1319    Subjective pt has sleeve and gauntlets  on order She wants to keep coming until she gets her sleeves in.    Pertinent History breast cancer in 2015 with chemo from  08/27/2014-12/14/2014, lumpectomy with 4 nodes removed 01/18/2015, current radiation to be completed July 15 for total of 33 treatments. Port on left side and will stay in until October    Patient Stated Goals Decrease swelling   Currently in Pain? No/denies                         [facility] Adult PT Treatment/Exercise - 05/31/15 0001    Self-Care   Other Self-Care Comments  issued another tg soft small for use at night and for symptom management, reinforced exercise for remedial lymphatic flow and also strength progression with her trainer   Manual Therapy   Manual Lymphatic Drainage (MLD) Diaphragmatic breathing, then short neck, right axilla and anterior interaxillary anastomosis, left groin and axillo-inguinal anastomosis, and left UE from fingers to shoulder, redirecting along pathways. extra attention to forearm                 PT Education - 05/31/15 1359    Education provided Yes   Education Details links for [employer] training lymphatic education session and youtube link for lymphatic flow series    Person(s) Educated Patient   Methods Explanation;Handout   Comprehension Verbalized understanding           Short Term Clinic Goals - 05/31/15 1407  CC Short Term Goal  #1   Title Patient with verbalize an understanding of lymphedema risk reduction precautions   Status Achieved   CC Short Term Goal  #2   Title Patient will have a circumferential reduction of       .5    cm at      cm above the    10     ulnar styloid    Status Achieved             Long Term Clinic Goals - 05/31/15 1408    CC Long Term Goal  #1   Title Patient will have a circumferential reduction of      1     cm at    10  cm above the         ulnar styloid    Status Achieved   CC Long Term Goal  #2   Title Patient will be independent in self manual lymph drainage   Status Achieved   CC Long Term Goal  #3   Title Patient will be know how to obtain and use compression garments for  maintenance phase of treatment   Status On-going   CC Long Term Goal  #4   Title Patient will decrease the DASH score to <30    to demonstrate increased functional use of upper extremity   Status On-going            Plan - 05/31/15 1404    Clinical Impression Statement pt attended ABC class and says she has a good understanding of risk reduction practices That goal is met. She continues to progress and seems to be following up well at home. Some congestion felt at forearm that lessened some with treatment today.  New tg soft issued as old one stretched out and has holes    Clinical Impairments Affecting Rehab Potential past chemo with generalized weakness and neuopathy symptoms    PT Next Visit Plan Continue manual lymph drainage and instruction assess fit and effectiveness of sleeve when it arrives         Problem List Patient Active Problem List   Diagnosis Date Noted  . Peripheral edema 01/11/2015  . Thrombocytopenia 01/08/2015  . Genetic testing 12/29/2014  . Hypokalemia 12/20/2014  . Antineoplastic chemotherapy induced anemia 12/20/2014  . Dehydration 11/11/2014  . Nausea without vomiting 11/11/2014  . Neutropenia 11/11/2014  . Syncope 11/11/2014  . Mucositis due to chemotherapy 09/03/2014  . Diarrhea 09/03/2014  . Vaginal yeast infection 09/03/2014  . Breast cancer of upper-outer quadrant of left female breast 08/09/2014   Donato Heinz. Owens Shark, PT  05/31/2015, 2:09 PM       PHYSICAL THERAPY DISCHARGE SUMMARY  Visits from Start of Care: 7  Current functional level related to goals / functional outcomes: Pt is able to manage lymphedema at home    Remaining deficits: Lymphedema    Education / Equipment: Self manual lymph drainage, exercise, use of compression Plan: Patient agrees to discharge.  Patient goals were partially met. Patient is being discharged due to not returning since the last visit.  ?????         Maudry Diego, PT 11/24/2015 10:46  AM    [place], [place], [address] Phone: [phone]   Fax:  [phone]

## 2015-05-31 NOTE — Assessment & Plan Note (Signed)
Left breast invasive ductal carcinoma grade 3 ER 0% PR 0% HER-2 positive ratio 3.74, Ki-67 90%: 2.3 cm mass by ultrasound clinical stage: T2, N0, M0 stage II A. Status post left lumpectomy 01/16/2015: Negative for in situ or invasive breast cancer 0/4 lymph nodes, complete pathologic response  Chemotherapy summary: Completed 6 cycles of neoadjuvant chemotherapy with Taxotere, carboplatin, Herceptin and Perjeta every 3 weeks. Herceptin maintenance therapy started 01/07/2015 Completed adjuvant radiation 05/13/2015  Plan: 1. Continue maintenance Herceptin (at a reduced dosage of 4 mg/kg because of diarrhea) Return to clinic every 6 weeks for follow-up

## 2015-05-31 NOTE — Patient Instructions (Addendum)
[email or web address] Courses Online strengh ABC Right of page  Lymphedema educations session    [email or web address] Lymphatic flow series with shoosh lettick crotzer

## 2015-06-01 ENCOUNTER — Other Ambulatory Visit (HOSPITAL_BASED_OUTPATIENT_CLINIC_OR_DEPARTMENT_OTHER): Payer: BLUE CROSS/BLUE SHIELD

## 2015-06-01 ENCOUNTER — Encounter: Payer: Self-pay | Admitting: General Practice

## 2015-06-01 ENCOUNTER — Telehealth: Payer: Self-pay | Admitting: Hematology and Oncology

## 2015-06-01 ENCOUNTER — Encounter: Payer: Self-pay | Admitting: Hematology and Oncology

## 2015-06-01 ENCOUNTER — Ambulatory Visit (HOSPITAL_BASED_OUTPATIENT_CLINIC_OR_DEPARTMENT_OTHER): Payer: BLUE CROSS/BLUE SHIELD

## 2015-06-01 ENCOUNTER — Ambulatory Visit (HOSPITAL_BASED_OUTPATIENT_CLINIC_OR_DEPARTMENT_OTHER): Payer: BLUE CROSS/BLUE SHIELD | Admitting: Hematology and Oncology

## 2015-06-01 VITALS — BP 132/85 | HR 75 | Temp 98.0°F | Resp 18 | Ht 62.0 in | Wt 158.8 lb

## 2015-06-01 DIAGNOSIS — Z5112 Encounter for antineoplastic immunotherapy: Secondary | ICD-10-CM

## 2015-06-01 DIAGNOSIS — C50412 Malignant neoplasm of upper-outer quadrant of left female breast: Secondary | ICD-10-CM

## 2015-06-01 LAB — CBC WITH DIFFERENTIAL/PLATELET
BASO%: 0.6 % (ref 0.0–2.0)
BASOS ABS: 0 10*3/uL (ref 0.0–0.1)
EOS%: 7 % (ref 0.0–7.0)
Eosinophils Absolute: 0.3 10*3/uL (ref 0.0–0.5)
HEMATOCRIT: 36.8 % (ref 34.8–46.6)
HGB: 12.3 g/dL (ref 11.6–15.9)
LYMPH#: 0.9 10*3/uL (ref 0.9–3.3)
LYMPH%: 25.4 % (ref 14.0–49.7)
MCH: 31.5 pg (ref 25.1–34.0)
MCHC: 33.4 g/dL (ref 31.5–36.0)
MCV: 94.4 fL (ref 79.5–101.0)
MONO#: 0.3 10*3/uL (ref 0.1–0.9)
MONO%: 8.3 % (ref 0.0–14.0)
NEUT#: 2.2 10*3/uL (ref 1.5–6.5)
NEUT%: 58.7 % (ref 38.4–76.8)
Platelets: 179 10*3/uL (ref 145–400)
RBC: 3.9 10*6/uL (ref 3.70–5.45)
RDW: 13.7 % (ref 11.2–14.5)
WBC: 3.7 10*3/uL — ABNORMAL LOW (ref 3.9–10.3)

## 2015-06-01 LAB — COMPREHENSIVE METABOLIC PANEL (CC13)
ALT: 27 U/L (ref 0–55)
ANION GAP: 7 meq/L (ref 3–11)
AST: 23 U/L (ref 5–34)
Albumin: 3.8 g/dL (ref 3.5–5.0)
Alkaline Phosphatase: 100 U/L (ref 40–150)
BUN: 14.3 mg/dL (ref 7.0–26.0)
CALCIUM: 9.9 mg/dL (ref 8.4–10.4)
CHLORIDE: 106 meq/L (ref 98–109)
CO2: 30 meq/L — AB (ref 22–29)
CREATININE: 0.9 mg/dL (ref 0.6–1.1)
EGFR: 67 mL/min/{1.73_m2} — ABNORMAL LOW (ref >90)
GLUCOSE: 102 mg/dL (ref 70–140)
Potassium: 4.3 mEq/L (ref 3.5–5.1)
SODIUM: 143 meq/L (ref 136–145)
TOTAL PROTEIN: 6.9 g/dL (ref 6.4–8.3)
Total Bilirubin: 0.48 mg/dL (ref 0.20–1.20)

## 2015-06-01 MED ORDER — TRASTUZUMAB CHEMO INJECTION 440 MG
4.0000 mg/kg | Freq: Once | INTRAVENOUS | Status: AC
  Administered 2015-06-01: 294 mg via INTRAVENOUS
  Filled 2015-06-01: qty 14

## 2015-06-01 MED ORDER — DIPHENHYDRAMINE HCL 25 MG PO CAPS
ORAL_CAPSULE | ORAL | Status: AC
  Filled 2015-06-01: qty 1

## 2015-06-01 MED ORDER — ACETAMINOPHEN 325 MG PO TABS
ORAL_TABLET | ORAL | Status: AC
  Filled 2015-06-01: qty 2

## 2015-06-01 MED ORDER — SODIUM CHLORIDE 0.9 % IV SOLN
Freq: Once | INTRAVENOUS | Status: AC
  Administered 2015-06-01: 10:00:00 via INTRAVENOUS

## 2015-06-01 MED ORDER — HEPARIN SOD (PORK) LOCK FLUSH 100 UNIT/ML IV SOLN
500.0000 [IU] | Freq: Once | INTRAVENOUS | Status: AC | PRN
  Administered 2015-06-01: 500 [IU]
  Filled 2015-06-01: qty 5

## 2015-06-01 MED ORDER — ACETAMINOPHEN 325 MG PO TABS
650.0000 mg | ORAL_TABLET | Freq: Once | ORAL | Status: AC
  Administered 2015-06-01: 650 mg via ORAL

## 2015-06-01 MED ORDER — DIPHENHYDRAMINE HCL 25 MG PO CAPS
50.0000 mg | ORAL_CAPSULE | Freq: Once | ORAL | Status: AC
Start: 2015-06-01 — End: 2015-06-01
  Administered 2015-06-01: 50 mg via ORAL

## 2015-06-01 MED ORDER — SODIUM CHLORIDE 0.9 % IJ SOLN
10.0000 mL | INTRAMUSCULAR | Status: DC | PRN
  Administered 2015-06-01: 10 mL
  Filled 2015-06-01: qty 10

## 2015-06-01 NOTE — Progress Notes (Signed)
Spiritual Care Note  Spoke with Julie [name] for 20-30 minutes in the infusion room, serving as a witness to her story of coping with and learning from cancer.  She was in good spirits, observing, "I feel like myself again," in terms of taste, routine, [name], and engagement in life (though, per pt, she still gets tired).  Working out and cooking with her family have been particularly meaningful for her; per pt, "This cancer may have saved my life--and even my husband's life--because we have changed our lifestyle so much" prior to and through dx/tx.  She has many events that she is looking forward to and has become very clear about her top priorities in life:  Family and friends.  Provided pastoral presence, reflective listening, emotional support, normalization of feelings, and affirmation of personal growth.  Pt aware of ongoing chaplain availability and appreciative of pastoral support.  Please also page as needs arise.  Thank you.  [place], [place] Pager [phone] Voicemail  [phone]

## 2015-06-01 NOTE — Telephone Encounter (Signed)
Appointments made and avs printed for patient,echo to linda for precert °

## 2015-06-01 NOTE — Patient Instructions (Signed)
[facility] Discharge Instructions for Patients Receiving Chemotherapy  Today you received the following chemotherapy agents Herceptin  To help prevent nausea and vomiting after your treatment, we encourage you to take your nausea medication    If you develop nausea and vomiting that is not controlled by your nausea medication, call the clinic.   BELOW ARE SYMPTOMS THAT SHOULD BE REPORTED IMMEDIATELY:  *FEVER GREATER THAN 100.5 F  *CHILLS WITH OR WITHOUT FEVER  NAUSEA AND VOMITING THAT IS NOT CONTROLLED WITH YOUR NAUSEA MEDICATION  *UNUSUAL SHORTNESS OF BREATH  *UNUSUAL BRUISING OR BLEEDING  TENDERNESS IN MOUTH AND THROAT WITH OR WITHOUT PRESENCE OF ULCERS  *URINARY PROBLEMS  *BOWEL PROBLEMS  UNUSUAL RASH Items with * indicate a potential emergency and should be followed up as soon as possible.  Feel free to call the clinic you have any questions or concerns. The clinic phone number is [phone].  Please show the CHEMO ALERT CARD at check-in to the Emergency Department and triage nurse.   

## 2015-06-01 NOTE — Progress Notes (Signed)
Patient Care Team: Seward Carol, MD as PCP - General (Internal Medicine) [name], MD as Consulting Physician (Surgical Oncology) Thea Silversmith, MD as Consulting Physician (Radiation Oncology) Nicholas Lose, MD as Consulting Physician (Hematology and Oncology)  DIAGNOSIS: Breast cancer of upper-outer quadrant of left female breast   Staging form: Breast, AJCC 7th Edition     Clinical: Stage IIA (T2, N0, cM0) - Unsigned       Staging comments: Staged at breast conference 08/11/14.      Pathologic stage from 01/20/2015: Stage Unknown (yTX, N0, cM0) - Signed by Enid Cutter, MD on 01/25/2015       Staging comments: Staged on final lumpectomy specimen by Dr. Donato Heinz    SUMMARY OF ONCOLOGIC HISTORY:   Breast cancer of upper-outer quadrant of left female breast   08/05/2014 Mammogram Left breast 10 o clock: 2.3X 2X1.4 cm lobular hypoechoeic   08/05/2014 Initial Biopsy Grade 3 IDC Er 0%, PR 0%, Her 2 positive Ratio 3.74; Ki 67: 90%   08/17/2014 Breast MRI Left breast known malignancy upper inner quadrant 2.6 cm: Suspicious non-mass enhancement upper-inner quadrant right breast biopsy pending   08/27/2014 - 12/17/2014 Chemotherapy Neoadjuvant chemotherapy with Taxotere, carboplatin, Herceptin, Perjeta every 3 weeks x6 cycles of Herceptin maintenance for one year.   12/27/2014 Breast MRI Significant treatment response. Previously biopsied mass is no longer visible, residual clumped linearly oriented non-mass enhancement anterior to the biopsied mass.   01/18/2015 Surgery  left breast lumpectomy: negative for in situ and invasive cancer complete pathologic response   03/29/2015 - 05/13/2015 Radiation Therapy Adj XRT    CHIEF COMPLIANT: Herceptin maintenance  INTERVAL HISTORY: Julie [name] is a 59 year old with above-mentioned history of left breast cancer who received new adjuvant chemotherapy and had a complete pathologic response and had received a Herceptin maintenance. She finished adjuvant  radiation therapy. She is here for a Herceptin maintenance today. She had an episode of profound swelling of the left arm and a CT of the chest with PE protocol did not reveal any evidence of blood clots. The swelling subsequently got better with physical therapy. She is wearing a sleeve on the left arm. She denies any other problems or concerns. She lost her great toenail.  REVIEW OF SYSTEMS:   Constitutional: Denies fevers, chills or abnormal weight loss Eyes: Denies blurriness of vision Ears, nose, mouth, throat, and face: Denies mucositis or sore throat Respiratory: Denies cough, dyspnea or wheezes Cardiovascular: Denies palpitation, chest discomfort or lower extremity swelling Gastrointestinal:  Denies nausea, heartburn or change in bowel habits Skin: Denies abnormal skin rashes Lymphatics: Denies new lymphadenopathy or easy bruising Neurological:Denies numbness, tingling or new weaknesses Behavioral/Psych: Mood is stable, no new changes   All other systems were reviewed with the patient and are negative.  I have reviewed the past medical history, past surgical history, social history and family history with the patient and they are unchanged from previous note.  ALLERGIES:  is allergic to codeine; sulfa antibiotics; and penicillins.  MEDICATIONS:  Current Outpatient Prescriptions  Medication Sig Dispense Refill  . acetaminophen (TYLENOL) 500 MG tablet Take 500 mg by mouth every 6 (six) hours as needed.    Marland Kitchen buPROPion (WELLBUTRIN SR) 150 MG 12 hr tablet Take 150 mg by mouth every evening.     Marland Kitchen esomeprazole (NEXIUM) 40 MG capsule Take 1 capsule (40 mg total) by mouth daily at 12 noon. 30 capsule 3  . fluticasone (FLONASE) 50 MCG/ACT nasal spray Place into both nostrils as needed for  allergies or rhinitis.    Marland Kitchen simvastatin (ZOCOR) 20 MG tablet Take 20 mg by mouth daily at 6 PM.     . SOOLANTRA 1 % CREA   0  . Wound Cleansers (RADIAPLEX EX) Apply 170 g topically.    Marland Kitchen loperamide  (IMODIUM) 2 MG capsule Take 1 capsule (2 mg total) by mouth as needed for diarrhea or loose stools. (Patient not taking: Reported on 06/01/2015) 30 capsule 2  . LORazepam (ATIVAN) 0.5 MG tablet Take 1 tablet (0.5 mg total) by mouth every 6 (six) hours as needed (Nausea or vomiting). (Patient not taking: Reported on 06/01/2015) 30 tablet 0  . ondansetron (ZOFRAN) 8 MG tablet Take 1 tablet (8 mg total) by mouth 2 (two) times daily. Start the day after chemo for 3 days. Then take as needed for nausea or vomiting. (Patient not taking: Reported on 06/01/2015) 30 tablet 1  . prochlorperazine (COMPAZINE) 10 MG tablet TAKE 1 TABLET EVERY 6 HOURS AS NEEDED FOR NAUSEA & VOMITING. (Patient not taking: Reported on 06/01/2015) 30 tablet 0   No current facility-administered medications for this visit.   Facility-Administered Medications Ordered in Other Visits  Medication Dose Route Frequency Provider Last Rate Last Dose  . heparin lock flush 100 unit/mL  250 Units Intracatheter PRN Susanne Borders, NP      . Influenza vac split quadrivalent PF (FLUARIX) injection 0.5 mL  0.5 mL Intramuscular Tomorrow-1000 Nicholas Lose, MD        PHYSICAL EXAMINATION: ECOG PERFORMANCE STATUS: 0 - Asymptomatic  Filed Vitals:   06/01/15 0858  BP: 132/85  Pulse: 75  Temp: 98 F (36.7 C)  Resp: 18   Filed Weights   06/01/15 0858  Weight: 158 lb 12.8 oz (72.031 kg)    GENERAL:alert, no distress and comfortable SKIN: skin color, texture, turgor are normal, no rashes or significant lesions EYES: normal, Conjunctiva are pink and non-injected, sclera clear OROPHARYNX:no exudate, no erythema and lips, buccal mucosa, and tongue normal  NECK: supple, thyroid normal size, non-tender, without nodularity LYMPH:  no palpable lymphadenopathy in the cervical, axillary or inguinal LUNGS: clear to auscultation and percussion with normal breathing effort HEART: regular rate & rhythm and no murmurs and no lower extremity  edema ABDOMEN:abdomen soft, non-tender and normal bowel sounds Musculoskeletal:no cyanosis of digits and no clubbing  NEURO: alert & oriented x 3 with fluent speech, no focal motor/sensory deficits  LABORATORY DATA:  I have reviewed the data as listed   Chemistry      Component Value Date/Time   NA 140 05/04/2015 1245   NA 144 02/18/2015 0914   K 3.9 05/04/2015 1245   K 3.4* 02/18/2015 0914   CL 105 05/04/2015 1245   CO2 30 05/04/2015 1245   CO2 23 02/18/2015 0914   BUN 12 05/04/2015 1245   BUN 14.3 02/18/2015 0914   CREATININE 0.82 05/04/2015 1245   CREATININE 0.8 02/18/2015 0914      Component Value Date/Time   CALCIUM 9.5 05/04/2015 1245   CALCIUM 9.5 02/18/2015 0914   ALKPHOS 97 05/04/2015 1245   ALKPHOS 103 02/18/2015 0914   AST 19 05/04/2015 1245   AST 17 02/18/2015 0914   ALT 19 05/04/2015 1245   ALT 15 02/18/2015 0914   BILITOT 0.5 05/04/2015 1245   BILITOT 0.32 02/18/2015 0914       Lab Results  Component Value Date   WBC 3.7* 06/01/2015   HGB 12.3 06/01/2015   HCT 36.8 06/01/2015   MCV 94.4  06/01/2015   PLT 179 06/01/2015   NEUTROABS 2.2 06/01/2015    ASSESSMENT & PLAN:  Breast cancer of upper-outer quadrant of left female breast Left breast invasive ductal carcinoma grade 3 ER 0% PR 0% HER-2 positive ratio 3.74, Ki-67 90%: 2.3 cm mass by ultrasound clinical stage: T2, N0, M0 stage II A. Status post left lumpectomy 01/16/2015: Negative for in situ or invasive breast cancer 0/4 lymph nodes, complete pathologic response  Chemotherapy summary: Completed 6 cycles of neoadjuvant chemotherapy with Taxotere, carboplatin, Herceptin and Perjeta every 3 weeks. Herceptin maintenance therapy started 01/07/2015 Completed adjuvant radiation 05/13/2015  Plan: 1. Continue maintenance Herceptin (at a reduced dosage of 4 mg/kg because of diarrhea) Return to clinic every 6 weeks for follow-up  2. She will need an echocardiogram for evaluation of cardiac function. As  soon as Herceptin maintenance is complete I will request Dr. Barry Dienes to remove her port.  No orders of the defined types were placed in this encounter.   The patient has a good understanding of the overall plan. she agrees with it. she will call with any problems that may develop before the next visit here.   Julie Eisenmenger, MD

## 2015-06-03 ENCOUNTER — Ambulatory Visit: Payer: BLUE CROSS/BLUE SHIELD | Admitting: Physical Therapy

## 2015-06-03 DIAGNOSIS — E8989 Other postprocedural endocrine and metabolic complications and disorders: Secondary | ICD-10-CM | POA: Diagnosis not present

## 2015-06-03 DIAGNOSIS — I890 Lymphedema, not elsewhere classified: Secondary | ICD-10-CM

## 2015-06-03 NOTE — Therapy (Signed)
[place], [place], [address] Phone: [phone]   Fax:  [phone]  Physical Therapy Treatment  Patient Details  Name: Julie [name] MRN: [id number] Date of Birth: 06/06/1956 Referring Provider:  Nicholas Lose, MD  Encounter Date: 06/03/2015      PT End of Session - 06/03/15 1205    Visit Number 8   Number of Visits 17   Date for PT Re-Evaluation 07/06/15   PT Start Time 1115   PT Stop Time 1154   PT Time Calculation (min) 39 min   Activity Tolerance Patient tolerated treatment well   Behavior During Therapy [facility] for tasks assessed/performed      Past Medical History  Diagnosis Date  . Uterine cyst   . Ovarian cyst   . Irregular menstrual cycle   . Hot flashes   . Breast cancer of upper-outer quadrant of left female breast   . Wears glasses   . PONV (postoperative nausea and vomiting)     severe  . Breast cancer 08/05/14    left breast bx  . Allergy     Past Surgical History  Procedure Laterality Date  . Uterine ablation  2006  . Cesarean section  1991  . Colonoscopy    . Diagnostic laparoscopy  1990    ovarian cysy  . Upper gi endoscopy    . Dilation and curettage of uterus    . Portacath placement Left 08/18/2014    Procedure: INSERTION PORT-A-CATH;  Surgeon: [name], MD;  Location: [place];  Service: General;  Laterality: Left;  . Radioactive seed guided mastectomy with axillary sentinel lymph node biopsy Left 01/18/2015    Procedure: RADIOACTIVE SEED GUIDED PARTIAL MASTECTOMY WITH AXILLARY SENTINEL LYMPH NODE BIOPSY;  Surgeon: [name], MD;  Location: [place];  Service: General;  Laterality: Left;    There were no vitals filed for this visit.  Visit Diagnosis:  Lymphedema of upper extremity following lymphadenectomy      Subjective Assessment - 06/03/15 1114    Subjective Wore TG soft until shower this morning; wore it all day  yesterday when preparing a big meal for a lot of people.  Feels like the arm is doing better and better.   Currently in Pain? No/denies               LYMPHEDEMA/ONCOLOGY QUESTIONNAIRE - 06/03/15 1117    Left Upper Extremity Lymphedema   15 cm Proximal to Olecranon Process 34 cm   10 cm Proximal to Olecranon Process 32.3 cm   Olecranon Process 25.3 cm   15 cm Proximal to Ulnar Styloid Process 25.7 cm   10 cm Proximal to Ulnar Styloid Process 24 cm   Just Proximal to Ulnar Styloid Process 17.5 cm   Across Hand at [employer] 18.6 cm   At [place] of 2nd Digit 5.6 cm                  [facility] Adult PT Treatment/Exercise - 06/03/15 0001    Manual Therapy   Edema Management circumference measurements taken   Manual Lymphatic Drainage (MLD) Diaphragmatic breathing, then short neck, right axilla and anterior interaxillary anastomosis, left groin and axillo-inguinal anastomosis, and left UE from fingers to shoulder, redirecting along pathways. extra attention to forearm                    Short Term Clinic Goals - 05/31/15 1407    CC  Short Term Goal  #1   Title Patient with verbalize an understanding of lymphedema risk reduction precautions   Status Achieved   CC Short Term Goal  #2   Title Patient will have a circumferential reduction of       .5    cm at      cm above the    10     ulnar styloid    Status Achieved             Long Term Clinic Goals - 05/31/15 1408    CC Long Term Goal  #1   Title Patient will have a circumferential reduction of      1     cm at    10  cm above the         ulnar styloid    Status Achieved   CC Long Term Goal  #2   Title Patient will be independent in self manual lymph drainage   Status Achieved   CC Long Term Goal  #3   Title Patient will be know how to obtain and use compression garments for maintenance phase of treatment   Status On-going   CC Long Term Goal  #4   Title Patient will decrease the DASH score to <30     to demonstrate increased functional use of upper extremity   Status On-going            Plan - 06/03/15 1205    Clinical Impression Statement Patient feels she is doing well.  Circumference measurements today show a slight increase at forearm and slight decrease at upper arm from 10 days ago, but minimal.  Forearm still has pitting.  Pt. has ordered a Lymphedivas class I compression sleeve and guantlet; this brand fit her best and the class I compression did not cause discomfort and hand swelling as the class II she tried on did.   Pt will benefit from skilled therapeutic intervention in order to improve on the following deficits Decreased range of motion;Increased edema;Pain   Rehab Potential Excellent   PT Frequency 2x / week   PT Duration 8 weeks   PT Treatment/Interventions Manual lymph drainage;Manual techniques;Patient/family education   PT Next Visit Plan Continue manual lymph drainage and instruction assess fit and effectiveness of sleeve when it arrives    Consulted and Agree with Plan of Care Patient        Problem List Patient Active Problem List   Diagnosis Date Noted  . Peripheral edema 01/11/2015  . Thrombocytopenia 01/08/2015  . Genetic testing 12/29/2014  . Hypokalemia 12/20/2014  . Antineoplastic chemotherapy induced anemia 12/20/2014  . Dehydration 11/11/2014  . Nausea without vomiting 11/11/2014  . Neutropenia 11/11/2014  . Syncope 11/11/2014  . Mucositis due to chemotherapy 09/03/2014  . Diarrhea 09/03/2014  . Vaginal yeast infection 09/03/2014  . Breast cancer of upper-outer quadrant of left female breast 08/09/2014    SALISBURY,DONNA 06/03/2015, 12:11 PM  [place], [place], [address] Phone: [phone]   Fax:  [place], PT 06/03/2015 12:11 PM

## 2015-06-06 ENCOUNTER — Encounter: Payer: Self-pay | Admitting: Physical Therapy

## 2015-06-06 ENCOUNTER — Ambulatory Visit: Payer: BLUE CROSS/BLUE SHIELD | Admitting: Physical Therapy

## 2015-06-06 DIAGNOSIS — I890 Lymphedema, not elsewhere classified: Secondary | ICD-10-CM

## 2015-06-06 DIAGNOSIS — E8989 Other postprocedural endocrine and metabolic complications and disorders: Secondary | ICD-10-CM

## 2015-06-06 NOTE — Therapy (Signed)
[place], [place], [address] Phone: [phone]   Fax:  [phone]  Physical Therapy Treatment  Patient Details  Name: Julie [name] MRN: [id number] Date of Birth: 10/28/1956 Referring Provider:  Nicholas Lose, MD  Encounter Date: 06/06/2015      PT End of Session - 06/06/15 1339    PT Start Time 1300   PT Stop Time 1343   PT Time Calculation (min) 43 min   Activity Tolerance Patient tolerated treatment well   Behavior During Therapy [facility] for tasks assessed/performed      Past Medical History  Diagnosis Date  . Uterine cyst   . Ovarian cyst   . Irregular menstrual cycle   . Hot flashes   . Breast cancer of upper-outer quadrant of left female breast   . Wears glasses   . PONV (postoperative nausea and vomiting)     severe  . Breast cancer 08/05/14    left breast bx  . Allergy     Past Surgical History  Procedure Laterality Date  . Uterine ablation  2006  . Cesarean section  1991  . Colonoscopy    . Diagnostic laparoscopy  1990    ovarian cysy  . Upper gi endoscopy    . Dilation and curettage of uterus    . Portacath placement Left 08/18/2014    Procedure: INSERTION PORT-A-CATH;  Surgeon: [name], MD;  Location: [place];  Service: General;  Laterality: Left;  . Radioactive seed guided mastectomy with axillary sentinel lymph node biopsy Left 01/18/2015    Procedure: RADIOACTIVE SEED GUIDED PARTIAL MASTECTOMY WITH AXILLARY SENTINEL LYMPH NODE BIOPSY;  Surgeon: [name], MD;  Location: [place];  Service: General;  Laterality: Left;    There were no vitals filed for this visit.  Visit Diagnosis:  Lymphedema of upper extremity following lymphadenectomy      Subjective Assessment - 06/06/15 1307    Subjective I think my arm is at a stand still.  I want to see if I can get a night time garment to try to reduce it more.   Pertinent History  breast cancer in 2015 with chemo from 08/27/2014-12/14/2014, lumpectomy with 4 nodes removed 01/18/2015, current radiation to be completed July 15 for total of 33 treatments. Port on left side and will stay in until October    Patient Stated Goals Decrease swelling   Currently in Pain? No/denies          [facility] Adult PT Treatment/Exercise - 06/06/15 0001    Manual Therapy   Manual Lymphatic Drainage (MLD) Diaphragmatic breathing, then short neck, right axilla and anterior interaxillary anastomosis, left groin and axillo-inguinal anastomosis, and left UE from fingers to shoulder, redirecting along pathways. extra attention to forearm             Short Term Clinic Goals - 05/31/15 1407    CC Short Term Goal  #1   Title Patient with verbalize an understanding of lymphedema risk reduction precautions   Status Achieved   CC Short Term Goal  #2   Title Patient will have a circumferential reduction of       .5    cm at      cm above the    10     ulnar styloid    Status Achieved             Long Term Clinic Goals - 05/31/15 1408    CC Long  Term Goal  #1   Title Patient will have a circumferential reduction of      1     cm at    10  cm above the         ulnar styloid    Status Achieved   CC Long Term Goal  #2   Title Patient will be independent in self manual lymph drainage   Status Achieved   CC Long Term Goal  #3   Title Patient will be know how to obtain and use compression garments for maintenance phase of treatment   Status On-going   CC Long Term Goal  #4   Title Patient will decrease the DASH score to <30    to demonstrate increased functional use of upper extremity   Status On-going            Plan - 06/06/15 1344    Clinical Impression Statement Left arm appears to be doing well but does have some mild swelling present in her forearm on the anterior side with mild pitting edema.  She was shown options for night time compression today and would liike to explore  getting a night time garment.   Pt will benefit from skilled therapeutic intervention in order to improve on the following deficits Decreased range of motion;Increased edema;Pain   Rehab Potential Excellent   Clinical Impairments Affecting Rehab Potential past chemo with generalized weakness and neuopathy symptoms    PT Frequency 2x / week   PT Duration 8 weeks   PT Treatment/Interventions Manual lymph drainage;Manual techniques;Patient/family education   PT Next Visit Plan Continue manual lymph drainage and instruction assess fit and effectiveness of sleeve when it arrives. Demographic info sent to [facility] for possible night time compression garment fitting.   Consulted and Agree with Plan of Care Patient        Problem List Patient Active Problem List   Diagnosis Date Noted  . Peripheral edema 01/11/2015  . Thrombocytopenia 01/08/2015  . Genetic testing 12/29/2014  . Hypokalemia 12/20/2014  . Antineoplastic chemotherapy induced anemia 12/20/2014  . Dehydration 11/11/2014  . Nausea without vomiting 11/11/2014  . Neutropenia 11/11/2014  . Syncope 11/11/2014  . Mucositis due to chemotherapy 09/03/2014  . Diarrhea 09/03/2014  . Vaginal yeast infection 09/03/2014  . Breast cancer of upper-outer quadrant of left female breast 08/09/2014   Annia Friendly, PT 06/06/2015 1:46 PM  [place], [place], [address] Phone: [phone]   Fax:  [phone]

## 2015-06-08 ENCOUNTER — Ambulatory Visit: Payer: BLUE CROSS/BLUE SHIELD

## 2015-06-08 ENCOUNTER — Other Ambulatory Visit: Payer: Self-pay

## 2015-06-08 DIAGNOSIS — I890 Lymphedema, not elsewhere classified: Secondary | ICD-10-CM

## 2015-06-08 DIAGNOSIS — E8989 Other postprocedural endocrine and metabolic complications and disorders: Secondary | ICD-10-CM

## 2015-06-08 NOTE — Therapy (Signed)
[place], [place], [address] Phone: [phone]   Fax:  [phone]  Physical Therapy Treatment  Patient Details  Name: Julie [name] MRN: [id number] Date of Birth: 01/03/1956 Referring Provider:  Nicholas Lose, MD  Encounter Date: 06/08/2015      PT End of Session - 06/08/15 1348    Visit Number 9   Number of Visits 17   Date for PT Re-Evaluation 07/06/15   PT Start Time 1302   PT Stop Time 1345   PT Time Calculation (min) 43 min   Activity Tolerance Patient tolerated treatment well   Behavior During Therapy [facility] for tasks assessed/performed      Past Medical History  Diagnosis Date  . Uterine cyst   . Ovarian cyst   . Irregular menstrual cycle   . Hot flashes   . Breast cancer of upper-outer quadrant of left female breast   . Wears glasses   . PONV (postoperative nausea and vomiting)     severe  . Breast cancer 08/05/14    left breast bx  . Allergy     Past Surgical History  Procedure Laterality Date  . Uterine ablation  2006  . Cesarean section  1991  . Colonoscopy    . Diagnostic laparoscopy  1990    ovarian cysy  . Upper gi endoscopy    . Dilation and curettage of uterus    . Portacath placement Left 08/18/2014    Procedure: INSERTION PORT-A-CATH;  Surgeon: [name], MD;  Location: [place];  Service: General;  Laterality: Left;  . Radioactive seed guided mastectomy with axillary sentinel lymph node biopsy Left 01/18/2015    Procedure: RADIOACTIVE SEED GUIDED PARTIAL MASTECTOMY WITH AXILLARY SENTINEL LYMPH NODE BIOPSY;  Surgeon: [name], MD;  Location: [place];  Service: General;  Laterality: Left;    There were no vitals filed for this visit.  Visit Diagnosis:  Lymphedema of upper extremity following lymphadenectomy      Subjective Assessment - 06/08/15 1309    Subjective I've decided not to get a nighttime garment at this time  but I did get measured for 2 day time sleeves and they should be arriving any day now. Going out of town this weekend.   Currently in Pain? No/denies                         [facility] Adult PT Treatment/Exercise - 06/08/15 0001    Manual Therapy   Manual Lymphatic Drainage (MLD) Short neck, superficial and deep abdominals, right axilla, right upper quadrant, and anterior interaxillary anastomosis, left groin and axillo-inguinal anastomosis, and left UE from fingers to shoulder, redirecting along pathways. extra attention to forearm                    Short Term Clinic Goals - 05/31/15 1407    CC Short Term Goal  #1   Title Patient with verbalize an understanding of lymphedema risk reduction precautions   Status Achieved   CC Short Term Goal  #2   Title Patient will have a circumferential reduction of       .5    cm at      cm above the    10     ulnar styloid    Status Achieved             Long Term Clinic Goals - 05/31/15 1408  CC Long Term Goal  #1   Title Patient will have a circumferential reduction of      1     cm at    10  cm above the         ulnar styloid    Status Achieved   CC Long Term Goal  #2   Title Patient will be independent in self manual lymph drainage   Status Achieved   CC Long Term Goal  #3   Title Patient will be know how to obtain and use compression garments for maintenance phase of treatment   Status On-going   CC Long Term Goal  #4   Title Patient will decrease the DASH score to <30    to demonstrate increased functional use of upper extremity   Status On-going            Plan - 06/08/15 1348    Clinical Impression Statement Pts left arm appears to be doing much better today with very little palpable swelling present and she reports much improved from last week. So much so that she wants to wait on a nighttime garment for now and will just be getting daytime garments. Instructed pt on how once lymphedema is present her  remaining lymph nodes will always be struggling with fluid getting fluid out of UE's and she should keep possibility of a nighttime garment open to prevent her from becoming Stage 1 lymphedema and she reported understanding.    Pt will benefit from skilled therapeutic intervention in order to improve on the following deficits Decreased range of motion;Increased edema;Pain   Rehab Potential Excellent   Clinical Impairments Affecting Rehab Potential past chemo with generalized weakness and neuopathy symptoms    PT Frequency 2x / week   PT Duration 8 weeks   PT Treatment/Interventions Manual lymph drainage;Manual techniques;Patient/family education   PT Next Visit Plan Continue manual lymph drainage and instruction assess fit and effectiveness of sleeve when it arrives.    Consulted and Agree with Plan of Care Patient        Problem List Patient Active Problem List   Diagnosis Date Noted  . Peripheral edema 01/11/2015  . Thrombocytopenia 01/08/2015  . Genetic testing 12/29/2014  . Hypokalemia 12/20/2014  . Antineoplastic chemotherapy induced anemia 12/20/2014  . Dehydration 11/11/2014  . Nausea without vomiting 11/11/2014  . Neutropenia 11/11/2014  . Syncope 11/11/2014  . Mucositis due to chemotherapy 09/03/2014  . Diarrhea 09/03/2014  . Vaginal yeast infection 09/03/2014  . Breast cancer of upper-outer quadrant of left female breast 08/09/2014    Otelia Limes, PTA 06/08/2015, 1:51 PM  [place], [place], [address] Phone: [phone]   Fax:  [phone]

## 2015-06-10 ENCOUNTER — Encounter: Payer: BLUE CROSS/BLUE SHIELD | Admitting: Physical Therapy

## 2015-06-13 ENCOUNTER — Encounter: Payer: BLUE CROSS/BLUE SHIELD | Admitting: Physical Therapy

## 2015-06-15 ENCOUNTER — Ambulatory Visit: Payer: BLUE CROSS/BLUE SHIELD

## 2015-06-15 DIAGNOSIS — E8989 Other postprocedural endocrine and metabolic complications and disorders: Secondary | ICD-10-CM

## 2015-06-15 DIAGNOSIS — I890 Lymphedema, not elsewhere classified: Secondary | ICD-10-CM

## 2015-06-15 NOTE — Therapy (Signed)
[place], [place], [address] Phone: [phone]   Fax:  [phone]  Physical Therapy Treatment  Patient Details  Name: Julie [name] MRN: [id number] Date of Birth: [birth year] Referring Provider:  Nicholas Lose, MD  Encounter Date: 06/15/2015      PT End of Session - 06/15/15 1227    Visit Number 10   Number of Visits 17   Date for PT Re-Evaluation 07/06/15   PT Start Time 1103   PT Stop Time 1147   PT Time Calculation (min) 44 min   Activity Tolerance Patient tolerated treatment well   Behavior During Therapy [facility] for tasks assessed/performed      Past Medical History  Diagnosis Date  . Uterine cyst   . Ovarian cyst   . Irregular menstrual cycle   . Hot flashes   . Breast cancer of upper-outer quadrant of left female breast   . Wears glasses   . PONV (postoperative nausea and vomiting)     severe  . Breast cancer 08/05/14    left breast bx  . Allergy     Past Surgical History  Procedure Laterality Date  . Uterine ablation  2006  . Cesarean section  1991  . Colonoscopy    . Diagnostic laparoscopy  1990    ovarian cysy  . Upper gi endoscopy    . Dilation and curettage of uterus    . Portacath placement Left 08/18/2014    Procedure: INSERTION PORT-A-CATH;  Surgeon: [name], MD;  Location: [place];  Service: General;  Laterality: Left;  . Radioactive seed guided mastectomy with axillary sentinel lymph node biopsy Left 01/18/2015    Procedure: RADIOACTIVE SEED GUIDED PARTIAL MASTECTOMY WITH AXILLARY SENTINEL LYMPH NODE BIOPSY;  Surgeon: [name], MD;  Location: [place];  Service: General;  Laterality: Left;    There were no vitals filed for this visit.  Visit Diagnosis:  Lymphedema of upper extremity following lymphadenectomy      Subjective Assessment - 06/15/15 1105    Subjective Got my new compression sleeve and gauntlet, they  feel/fit good.    Currently in Pain? No/denies               LYMPHEDEMA/ONCOLOGY QUESTIONNAIRE - 06/15/15 1108    Left Upper Extremity Lymphedema   15 cm Proximal to Olecranon Process 34 cm   10 cm Proximal to Olecranon Process 31.9 cm   Olecranon Process 25.2 cm   15 cm Proximal to Ulnar Styloid Process 25.2 cm   10 cm Proximal to Ulnar Styloid Process 23.1 cm   Just Proximal to Ulnar Styloid Process 16.1 cm   Across Hand at [employer] 17.9 cm   At [place] of 2nd Digit 5.5 cm           Quick Dash - 06/15/15 0001    Open a tight or new jar Mild difficulty   Do heavy household chores (wash walls, wash floors) Mild difficulty   Carry a shopping bag or briefcase Mild difficulty   Wash your back Mild difficulty   Use a knife to cut food No difficulty   Recreational activities in which you take some force or impact through your arm, shoulder, or hand (golf, hammering, tennis) No difficulty   During the past week, to what extent has your arm, shoulder or hand problem interfered with your normal social activities with family, friends, neighbors, or groups? Not at all   During the  past week, to what extent has your arm, shoulder or hand problem limited your work or other regular daily activities Not at all   Arm, shoulder, or hand pain. None   Tingling (pins and needles) in your arm, shoulder, or hand None   Difficulty Sleeping No difficulty   DASH Score 9.09 %               [facility] Adult PT Treatment/Exercise - 06/15/15 0001    Manual Therapy   Manual therapy comments Pt was wearing her new compression sleeve and glove upon arrival today. Appears to be a good fit and after treatment instructed pt with verbal cuing and she returned demo of correct donning and how to use a rubber glove to get sleeve to axilla.    Manual Lymphatic Drainage (MLD) Short neck, superficial and deep abdominals, right axilla, right upper quadrant, and anterior interaxillary anastomosis, left  groin and axillo-inguinal anastomosis, and left UE from fingers to shoulder, redirecting along pathways.                PT Education - 06/15/15 1233    Education provided Yes   Education Details Correct donning techniques for compression sleeve and how to use rubber glove to assist with getting glove to axilla with even compression throughout arm and not just stretching it at the top.    Person(s) Educated Patient   Methods Explanation;Demonstration;Verbal cues   Comprehension Verbalized understanding;Returned demonstration           Short Term Clinic Goals - 05/31/15 1407    CC Short Term Goal  #1   Title Patient with verbalize an understanding of lymphedema risk reduction precautions   Status Achieved   CC Short Term Goal  #2   Title Patient will have a circumferential reduction of       .5    cm at      cm above the    10     ulnar styloid    Status Achieved             Long Term Clinic Goals - 06/15/15 1105    CC Long Term Goal  #1   Title Patient will have a circumferential reduction of      1     cm at    10  cm above the         ulnar styloid    Status Achieved   CC Long Term Goal  #2   Title Patient will be independent in self manual lymph drainage   Status Achieved   CC Long Term Goal  #3   Title Patient will be know how to obtain and use compression garments for maintenance phase of treatment   Status Achieved   CC Long Term Goal  #4   Title Patient will decrease the DASH score to <30    to demonstrate increased functional use of upper extremity  Pt decreased her score to 9.09 on last visit. 06/15/15    Status Achieved            Plan - 06/15/15 1228    Clinical Impression Statement [name] circumference measurements have reduced very well since start of care. She now has 2 compression sleeves/gloves and wears these daily. She is also performing her self manual lymph drianage almost daily being very compliant with her Maintenance Phase of  treatment. Pt has met all goals and is readay for discharge at this time.    Pt  will benefit from skilled therapeutic intervention in order to improve on the following deficits Decreased range of motion;Increased edema;Pain   Rehab Potential Excellent   Clinical Impairments Affecting Rehab Potential past chemo with generalized weakness and neuopathy symptoms    PT Frequency 2x / week   PT Duration 8 weeks   PT Treatment/Interventions Manual lymph drainage;Manual techniques;Patient/family education   PT Next Visit Plan Discharge this visit.    Recommended Other [place] phone number if pt decides she wants a nighttime garment in the future.    Consulted and Agree with Plan of Care Patient        Problem List Patient Active Problem List   Diagnosis Date Noted  . Peripheral edema 01/11/2015  . Thrombocytopenia 01/08/2015  . Genetic testing 12/29/2014  . Hypokalemia 12/20/2014  . Antineoplastic chemotherapy induced anemia 12/20/2014  . Dehydration 11/11/2014  . Nausea without vomiting 11/11/2014  . Neutropenia 11/11/2014  . Syncope 11/11/2014  . Mucositis due to chemotherapy 09/03/2014  . Diarrhea 09/03/2014  . Vaginal yeast infection 09/03/2014  . Breast cancer of upper-outer quadrant of left female breast 08/09/2014    SALISBURY,DONNA, PTA 06/15/2015, 10:04 PM  [place], [place], [address] Phone: [phone]   Fax:  [phone]     PHYSICAL THERAPY DISCHARGE SUMMARY  Visits from Start of Care: 10  Current functional level related to goals / functional outcomes: Goals met as noted above.   Remaining deficits: None at this time.   Education / Equipment: Self-management of lymphedema Plan: Patient agrees to discharge.  Patient goals were met. Patient is being discharged due to meeting the stated rehab goals.  ?????   Serafina Royals, PT 06/15/2015 10:05  PM

## 2015-06-17 ENCOUNTER — Ambulatory Visit (HOSPITAL_COMMUNITY)
Admission: RE | Admit: 2015-06-17 | Discharge: 2015-06-17 | Disposition: A | Discharge status: Home / Self Care | Payer: BLUE CROSS/BLUE SHIELD | Source: Ambulatory Visit | Attending: Hematology and Oncology | Admitting: Hematology and Oncology

## 2015-06-17 ENCOUNTER — Ambulatory Visit: Payer: BLUE CROSS/BLUE SHIELD | Admitting: Physical Therapy

## 2015-06-17 ENCOUNTER — Other Ambulatory Visit: Payer: Self-pay | Admitting: Hematology and Oncology

## 2015-06-17 DIAGNOSIS — C50412 Malignant neoplasm of upper-outer quadrant of left female breast: Secondary | ICD-10-CM | POA: Diagnosis not present

## 2015-06-17 DIAGNOSIS — I340 Nonrheumatic mitral (valve) insufficiency: Secondary | ICD-10-CM | POA: Insufficient documentation

## 2015-06-17 NOTE — Progress Notes (Signed)
  Echocardiogram 2D Echocardiogram has been performed.  Julie [name] 06/17/2015, 11:55 AM

## 2015-06-21 ENCOUNTER — Ambulatory Visit (HOSPITAL_BASED_OUTPATIENT_CLINIC_OR_DEPARTMENT_OTHER): Payer: BLUE CROSS/BLUE SHIELD

## 2015-06-21 ENCOUNTER — Encounter: Payer: Self-pay | Admitting: General Practice

## 2015-06-21 VITALS — BP 120/74 | HR 71 | Temp 97.8°F | Resp 18

## 2015-06-21 DIAGNOSIS — Z5112 Encounter for antineoplastic immunotherapy: Secondary | ICD-10-CM | POA: Diagnosis not present

## 2015-06-21 DIAGNOSIS — C50412 Malignant neoplasm of upper-outer quadrant of left female breast: Secondary | ICD-10-CM

## 2015-06-21 MED ORDER — HEPARIN SOD (PORK) LOCK FLUSH 100 UNIT/ML IV SOLN
500.0000 [IU] | Freq: Once | INTRAVENOUS | Status: AC | PRN
  Administered 2015-06-21: 500 [IU]
  Filled 2015-06-21: qty 5

## 2015-06-21 MED ORDER — SODIUM CHLORIDE 0.9 % IJ SOLN
10.0000 mL | INTRAMUSCULAR | Status: DC | PRN
  Administered 2015-06-21: 10 mL
  Filled 2015-06-21: qty 10

## 2015-06-21 MED ORDER — ACETAMINOPHEN 325 MG PO TABS
650.0000 mg | ORAL_TABLET | Freq: Once | ORAL | Status: AC
  Administered 2015-06-21: 650 mg via ORAL

## 2015-06-21 MED ORDER — ACETAMINOPHEN 325 MG PO TABS
ORAL_TABLET | ORAL | Status: AC
  Filled 2015-06-21: qty 2

## 2015-06-21 MED ORDER — DIPHENHYDRAMINE HCL 25 MG PO CAPS
ORAL_CAPSULE | ORAL | Status: AC
  Filled 2015-06-21: qty 2

## 2015-06-21 MED ORDER — DIPHENHYDRAMINE HCL 25 MG PO CAPS
50.0000 mg | ORAL_CAPSULE | Freq: Once | ORAL | Status: AC
Start: 2015-06-21 — End: 2015-06-21
  Administered 2015-06-21: 50 mg via ORAL

## 2015-06-21 MED ORDER — SODIUM CHLORIDE 0.9 % IV SOLN
Freq: Once | INTRAVENOUS | Status: AC
  Administered 2015-06-21: 10:00:00 via INTRAVENOUS

## 2015-06-21 MED ORDER — TRASTUZUMAB CHEMO INJECTION 440 MG
4.0000 mg/kg | Freq: Once | INTRAVENOUS | Status: AC
  Administered 2015-06-21: 294 mg via INTRAVENOUS
  Filled 2015-06-21: qty 14

## 2015-06-21 NOTE — Patient Instructions (Signed)
[facility] Discharge Instructions for Patients Receiving Chemotherapy  Today you received the following chemotherapy agents: Herceptin   To help prevent nausea and vomiting after your treatment, we encourage you to take your nausea medication as directed.    If you develop nausea and vomiting that is not controlled by your nausea medication, call the clinic.   BELOW ARE SYMPTOMS THAT SHOULD BE REPORTED IMMEDIATELY:  *FEVER GREATER THAN 100.5 F  *CHILLS WITH OR WITHOUT FEVER  NAUSEA AND VOMITING THAT IS NOT CONTROLLED WITH YOUR NAUSEA MEDICATION  *UNUSUAL SHORTNESS OF BREATH  *UNUSUAL BRUISING OR BLEEDING  TENDERNESS IN MOUTH AND THROAT WITH OR WITHOUT PRESENCE OF ULCERS  *URINARY PROBLEMS  *BOWEL PROBLEMS  UNUSUAL RASH Items with * indicate a potential emergency and should be followed up as soon as possible.  Feel free to call the clinic you have any questions or concerns. The clinic phone number is [phone].  Please show the CHEMO ALERT CARD at check-in to the Emergency Department and triage nurse.   

## 2015-06-21 NOTE — Progress Notes (Signed)
Spiritual Care Note  Met with Julie [name] in the lobby.  She was in good spirits as she anticipates completing treatment and is beginning to make plans for life afterward.  Provided pastoral presence and reflective listening, while naming and normalizing the variety of feelings that can arise after treatment, including disorientation and search for meaning.  We compared this to shifting identity that can come with retirement, exploring activities, tools, and priorities that have been meaningful for her since she left [place] three years ago.  She also named her fear of recurrence, and we talked about using fear/vulnerabiltiy/awareness of mortality as fuel to live her life well, which is a major current focus of hers already.  Recommended FYNN and support groups as sources of encouragement and connection.  She is aware of ongoing chaplain availability, but please also page as needs arise.  Thank you.  [place], [place] Pager [phone] Voicemail  [phone]

## 2015-06-23 ENCOUNTER — Ambulatory Visit
Admission: RE | Admit: 2015-06-23 | Discharge: 2015-06-23 | Disposition: A | Discharge status: Home / Self Care | Payer: BLUE CROSS/BLUE SHIELD | Source: Ambulatory Visit | Attending: Radiation Oncology | Admitting: Radiation Oncology

## 2015-06-23 VITALS — BP 116/80 | HR 72 | Temp 98.3°F | Wt 161.6 lb

## 2015-06-23 DIAGNOSIS — C50412 Malignant neoplasm of upper-outer quadrant of left female breast: Secondary | ICD-10-CM

## 2015-06-23 NOTE — Progress Notes (Signed)
Routine one month follow up completion of radiation to left breast.Denies pain.Skin is healed with mild discoloration.Continue application of aloe lotion with vitamin e..Decrease in edema of left arm The current medical regimen is effective;  continue present plan and medications. Wearing jobe stocking for one more month.Has 3 more herceptin infusions with last on 08/23/15.

## 2015-06-23 NOTE — Progress Notes (Signed)
   Department of Radiation Oncology  Phone:  [phone] Fax:        [phone]   Name: Julie [name] MRN: [id number]  DOB: 1956-09-07  Date: 06/23/2015  Follow Up Visit Note  Diagnosis: Breast cancer of upper-outer quadrant of left female breast   Staging form: Breast, AJCC 7th Edition     Clinical: Stage IIA (T2, N0, cM0) - Unsigned       Staging comments: Staged at breast conference 08/11/14.      Pathologic stage from 01/20/2015: Stage Unknown (yTX, N0, cM0) - Signed by Enid Cutter, MD on 01/25/2015       Staging comments: Staged on final lumpectomy specimen by Dr. Donato Heinz  Summary and Interval since last radiation: 6 weeks. 5/31-7/15/16 Site/dose:   Left breast/ 45 Gy at 1.8 Gy per fraction x 25 fractions.  Left breast boost/ 16 Gy at 2 Gy per fraction x 8 fractions  Interval History: Julie [name] presents today for routine followup. The pt presents to the clinic with a lymphedema sleeve on her left arm. She reports that physical therapy and the sleeve have helped. The pt still has her port-a-cath in place. She has 3 Herceptin infusions left with the last one in late October. She is pleased with her cosmetic result.   Physical Exam:  Filed Vitals:   06/23/15 1308  BP: 116/80  Pulse: 72  Temp: 98.3 F (36.8 C)  Weight: 161 lb 9.6 oz (73.301 kg)  Alert and oriented times three. In no distress. Mild hyperpigmentation around the nipple.   IMPRESSION: Julie [name] is a 59 y.o. female with Stage IIA Left Breast Cancer. Healing from radiotherapy.  PLAN:  She is doing well. We discussed the need for follow up every 4-6 months which she has scheduled.  We discussed the need for yearly mammograms which she can schedule with her OBGYN or with medical oncology. We discussed the need for sun protection in the treated area.  She can always call me with questions.  I will follow up with her on an as needed basis. I advised the pt to not have tattoos placed in the radiation treatment area for the  next 6 months and she is convinced she will wait at least a year.    This document serves as a record of services personally performed by Thea Silversmith, MD. It was created on her behalf by Darcus Austin, a trained medical scribe. The creation of this record is based on the scribe's personal observations and the provider's statements to them. This document has been checked and approved by the attending provider.   Thea Silversmith, MD

## 2015-07-12 ENCOUNTER — Ambulatory Visit: Payer: BLUE CROSS/BLUE SHIELD

## 2015-07-12 ENCOUNTER — Other Ambulatory Visit: Payer: BLUE CROSS/BLUE SHIELD

## 2015-07-12 ENCOUNTER — Ambulatory Visit: Payer: BLUE CROSS/BLUE SHIELD | Admitting: Hematology and Oncology

## 2015-07-14 NOTE — Assessment & Plan Note (Signed)
Left breast invasive ductal carcinoma grade 3 ER 0% PR 0% HER-2 positive ratio 3.74, Ki-67 90%: 2.3 cm mass by ultrasound clinical stage: T2, N0, M0 stage II A. Status post left lumpectomy 01/16/2015: Negative for in situ or invasive breast cancer 0/4 lymph nodes, complete pathologic response  Chemotherapy summary: Completed 6 cycles of neoadjuvant chemotherapy with Taxotere, carboplatin, Herceptin and Perjeta every 3 weeks started 08/28/15. Herceptin maintenance therapy started 01/07/2015 Completed adjuvant radiation 05/13/2015  Plan:  1. Continue maintenance Herceptin (at a reduced dosage of 4 mg/kg because of diarrhea) to complete end of October 2016 2. Echocardiogram EF 55-60%. As soon as Herceptin maintenance is complete I will request Dr. Byerly to remove her port.  Return to clinic every 6 weeks for follow-up  

## 2015-07-15 ENCOUNTER — Encounter: Payer: Self-pay | Admitting: Hematology and Oncology

## 2015-07-15 ENCOUNTER — Ambulatory Visit (HOSPITAL_BASED_OUTPATIENT_CLINIC_OR_DEPARTMENT_OTHER): Payer: BLUE CROSS/BLUE SHIELD | Admitting: Hematology and Oncology

## 2015-07-15 ENCOUNTER — Ambulatory Visit (HOSPITAL_BASED_OUTPATIENT_CLINIC_OR_DEPARTMENT_OTHER): Payer: BLUE CROSS/BLUE SHIELD

## 2015-07-15 ENCOUNTER — Other Ambulatory Visit: Payer: BLUE CROSS/BLUE SHIELD

## 2015-07-15 VITALS — BP 138/83 | HR 75 | Temp 98.3°F | Resp 18 | Ht 62.0 in | Wt 163.0 lb

## 2015-07-15 DIAGNOSIS — C50412 Malignant neoplasm of upper-outer quadrant of left female breast: Secondary | ICD-10-CM

## 2015-07-15 DIAGNOSIS — Z5112 Encounter for antineoplastic immunotherapy: Secondary | ICD-10-CM

## 2015-07-15 MED ORDER — DIPHENHYDRAMINE HCL 25 MG PO CAPS
ORAL_CAPSULE | ORAL | Status: AC
  Filled 2015-07-15: qty 2

## 2015-07-15 MED ORDER — SODIUM CHLORIDE 0.9 % IV SOLN
Freq: Once | INTRAVENOUS | Status: AC
  Administered 2015-07-15: 10:00:00 via INTRAVENOUS

## 2015-07-15 MED ORDER — ACETAMINOPHEN 325 MG PO TABS
650.0000 mg | ORAL_TABLET | Freq: Once | ORAL | Status: AC
  Administered 2015-07-15: 650 mg via ORAL

## 2015-07-15 MED ORDER — ACETAMINOPHEN 325 MG PO TABS
ORAL_TABLET | ORAL | Status: AC
  Filled 2015-07-15: qty 2

## 2015-07-15 MED ORDER — HEPARIN SOD (PORK) LOCK FLUSH 100 UNIT/ML IV SOLN
500.0000 [IU] | Freq: Once | INTRAVENOUS | Status: AC | PRN
  Administered 2015-07-15: 500 [IU]
  Filled 2015-07-15: qty 5

## 2015-07-15 MED ORDER — DIPHENHYDRAMINE HCL 25 MG PO CAPS
50.0000 mg | ORAL_CAPSULE | Freq: Once | ORAL | Status: AC
  Administered 2015-07-15: 50 mg via ORAL

## 2015-07-15 MED ORDER — SODIUM CHLORIDE 0.9 % IJ SOLN
10.0000 mL | INTRAMUSCULAR | Status: DC | PRN
  Administered 2015-07-15: 10 mL
  Filled 2015-07-15: qty 10

## 2015-07-15 MED ORDER — SODIUM CHLORIDE 0.9 % IV SOLN
4.0000 mg/kg | Freq: Once | INTRAVENOUS | Status: AC
  Administered 2015-07-15: 294 mg via INTRAVENOUS
  Filled 2015-07-15: qty 14

## 2015-07-15 NOTE — Progress Notes (Signed)
Patient Care Team: Seward Carol, MD as PCP - General (Internal Medicine) [name], MD as Consulting Physician (Surgical Oncology) Thea Silversmith, MD as Consulting Physician (Radiation Oncology) Nicholas Lose, MD as Consulting Physician (Hematology and Oncology)  DIAGNOSIS: Breast cancer of upper-outer quadrant of left female breast   Staging form: Breast, AJCC 7th Edition     Clinical: Stage IIA (T2, N0, cM0) - Unsigned       Staging comments: Staged at breast conference 08/11/14.      Pathologic stage from 01/20/2015: Stage Unknown (yTX, N0, cM0) - Signed by Enid Cutter, MD on 01/25/2015       Staging comments: Staged on final lumpectomy specimen by Dr. Donato Heinz    SUMMARY OF ONCOLOGIC HISTORY:   Breast cancer of upper-outer quadrant of left female breast   08/05/2014 Mammogram Left breast 10 o clock: 2.3X 2X1.4 cm lobular hypoechoeic   08/05/2014 Initial Biopsy Grade 3 IDC Er 0%, PR 0%, Her 2 positive Ratio 3.74; Ki 67: 90%   08/17/2014 Breast MRI Left breast known malignancy upper inner quadrant 2.6 cm: Suspicious non-mass enhancement upper-inner quadrant right breast biopsy pending   08/27/2014 - 12/17/2014 Chemotherapy Neoadjuvant chemotherapy with Taxotere, carboplatin, Herceptin, Perjeta every 3 weeks x6 cycles of Herceptin maintenance for one year.   12/27/2014 Breast MRI Significant treatment response. Previously biopsied mass is no longer visible, residual clumped linearly oriented non-mass enhancement anterior to the biopsied mass.   01/18/2015 Surgery  left breast lumpectomy: negative for in situ and invasive cancer complete pathologic response   03/29/2015 - 05/13/2015 Radiation Therapy Adj XRT    CHIEF COMPLIANT: Herceptin maintenance  INTERVAL HISTORY: Julie [name] is a 59 year old with above-mentioned history of left breast cancer treated with new adjuvant chemotherapy followed by lumpectomy and radiation and is currently on adjuvant Herceptin maintenance. She appears to be  tolerating the lower dosage of Herceptin much better. Denies any new problems or concerns.  REVIEW OF SYSTEMS:   Constitutional: Denies fevers, chills or abnormal weight loss Eyes: Denies blurriness of vision Ears, nose, mouth, throat, and face: Denies mucositis or sore throat Respiratory: Denies cough, dyspnea or wheezes Cardiovascular: Denies palpitation, chest discomfort or lower extremity swelling Gastrointestinal:  Denies nausea, heartburn or change in bowel habits Skin: Denies abnormal skin rashes Lymphatics: Denies new lymphadenopathy or easy bruising Neurological:Denies numbness, tingling or new weaknesses Behavioral/Psych: Mood is stable, no new changes  Breast:  denies any pain or lumps or nodules in either breasts All other systems were reviewed with the patient and are negative.  I have reviewed the past medical history, past surgical history, social history and family history with the patient and they are unchanged from previous note.  ALLERGIES:  is allergic to codeine; sulfa antibiotics; and penicillins.  MEDICATIONS:  Current Outpatient Prescriptions  Medication Sig Dispense Refill  . acetaminophen (TYLENOL) 500 MG tablet Take 500 mg by mouth every 6 (six) hours as needed.    Marland Kitchen buPROPion (WELLBUTRIN SR) 150 MG 12 hr tablet Take 150 mg by mouth every evening.     Marland Kitchen esomeprazole (NEXIUM) 40 MG capsule Take 1 capsule (40 mg total) by mouth daily at 12 noon. 30 capsule 3  . fluticasone (FLONASE) 50 MCG/ACT nasal spray Place into both nostrils as needed for allergies or rhinitis.    Marland Kitchen loperamide (IMODIUM) 2 MG capsule Take 1 capsule (2 mg total) by mouth as needed for diarrhea or loose stools. (Patient not taking: Reported on 06/01/2015) 30 capsule 2  . LORazepam (ATIVAN) 0.5  MG tablet Take 1 tablet (0.5 mg total) by mouth every 6 (six) hours as needed (Nausea or vomiting). (Patient not taking: Reported on 06/01/2015) 30 tablet 0  . ondansetron (ZOFRAN) 8 MG tablet Take 1 tablet  (8 mg total) by mouth 2 (two) times daily. Start the day after chemo for 3 days. Then take as needed for nausea or vomiting. (Patient not taking: Reported on 06/01/2015) 30 tablet 1  . prochlorperazine (COMPAZINE) 10 MG tablet TAKE 1 TABLET EVERY 6 HOURS AS NEEDED FOR NAUSEA & VOMITING. (Patient not taking: Reported on 06/01/2015) 30 tablet 0  . simvastatin (ZOCOR) 20 MG tablet Take 20 mg by mouth daily at 6 PM.     . SOOLANTRA 1 % CREA   0   No current facility-administered medications for this visit.   Facility-Administered Medications Ordered in Other Visits  Medication Dose Route Frequency Provider Last Rate Last Dose  . heparin lock flush 100 unit/mL  250 Units Intracatheter PRN Cynthia R Bacon, NP      . Influenza vac split quadrivalent PF (FLUARIX) injection 0.5 mL  0.5 mL Intramuscular Tomorrow-1000 Vinay Gudena, MD        PHYSICAL EXAMINATION: ECOG PERFORMANCE STATUS: 0 - Asymptomatic  Filed Vitals:   07/15/15 0833  BP: 138/83  Pulse: 75  Temp: 98.3 F (36.8 C)  Resp: 18   Filed Weights   07/15/15 0833  Weight: 163 lb (73.936 kg)    GENERAL:alert, no distress and comfortable SKIN: skin color, texture, turgor are normal, no rashes or significant lesions EYES: normal, Conjunctiva are pink and non-injected, sclera clear OROPHARYNX:no exudate, no erythema and lips, buccal mucosa, and tongue normal  NECK: supple, thyroid normal size, non-tender, without nodularity LYMPH:  no palpable lymphadenopathy in the cervical, axillary or inguinal LUNGS: clear to auscultation and percussion with normal breathing effort HEART: regular rate & rhythm and no murmurs and no lower extremity edema ABDOMEN:abdomen soft, non-tender and normal bowel sounds Musculoskeletal:no cyanosis of digits and no clubbing  NEURO: alert & oriented x 3 with fluent speech, no focal motor/sensory deficits  LABORATORY DATA:  I have reviewed the data as listed   Chemistry      Component Value Date/Time   NA  143 06/01/2015 0828   NA 140 05/04/2015 1245   K 4.3 06/01/2015 0828   K 3.9 05/04/2015 1245   CL 105 05/04/2015 1245   CO2 30* 06/01/2015 0828   CO2 30 05/04/2015 1245   BUN 14.3 06/01/2015 0828   BUN 12 05/04/2015 1245   CREATININE 0.9 06/01/2015 0828   CREATININE 0.82 05/04/2015 1245      Component Value Date/Time   CALCIUM 9.9 06/01/2015 0828   CALCIUM 9.5 05/04/2015 1245   ALKPHOS 100 06/01/2015 0828   ALKPHOS 97 05/04/2015 1245   AST 23 06/01/2015 0828   AST 19 05/04/2015 1245   ALT 27 06/01/2015 0828   ALT 19 05/04/2015 1245   BILITOT 0.48 06/01/2015 0828   BILITOT 0.5 05/04/2015 1245       Lab Results  Component Value Date   WBC 3.7* 06/01/2015   HGB 12.3 06/01/2015   HCT 36.8 06/01/2015   MCV 94.4 06/01/2015   PLT 179 06/01/2015   NEUTROABS 2.2 06/01/2015   ASSESSMENT & PLAN:  Breast cancer of upper-outer quadrant of left female breast Left breast invasive ductal carcinoma grade 3 ER 0% PR 0% HER-2 positive ratio 3.74, Ki-67 90%: 2.3 cm mass by ultrasound clinical stage: T2, N0, M0 stage II   A. Status post left lumpectomy 01/16/2015: Negative for in situ or invasive breast cancer 0/4 lymph nodes, complete pathologic response  Chemotherapy summary: Completed 6 cycles of neoadjuvant chemotherapy with Taxotere, carboplatin, Herceptin and Perjeta every 3 weeks started 08/28/15. Herceptin maintenance therapy started 01/07/2015 Completed adjuvant radiation 05/13/2015  Plan: 1. Continue maintenance Herceptin (at a reduced dosage of 4 mg/kg because of diarrhea) to complete end of October 2016 2. Echocardiogram EF 55-60% (06/17/15). As soon as Herceptin maintenance is complete I will request Dr. Barry Dienes to remove her port.  Cellulitis of right great toe: Currently on doxycycline antibiotics Return to clinic every 6 weeks for follow-up   No orders of the defined types were placed in this encounter.   The patient has a good understanding of the overall plan. she  agrees with it. she will call with any problems that may develop before the next visit here.   Rulon Eisenmenger, MD

## 2015-07-15 NOTE — Patient Instructions (Signed)
[facility] Discharge Instructions for Patients Receiving Chemotherapy  Today you received the following chemotherapy agents: Herceptin   To help prevent nausea and vomiting after your treatment, we encourage you to take your nausea medication as directed.    If you develop nausea and vomiting that is not controlled by your nausea medication, call the clinic.   BELOW ARE SYMPTOMS THAT SHOULD BE REPORTED IMMEDIATELY:  *FEVER GREATER THAN 100.5 F  *CHILLS WITH OR WITHOUT FEVER  NAUSEA AND VOMITING THAT IS NOT CONTROLLED WITH YOUR NAUSEA MEDICATION  *UNUSUAL SHORTNESS OF BREATH  *UNUSUAL BRUISING OR BLEEDING  TENDERNESS IN MOUTH AND THROAT WITH OR WITHOUT PRESENCE OF ULCERS  *URINARY PROBLEMS  *BOWEL PROBLEMS  UNUSUAL RASH Items with * indicate a potential emergency and should be followed up as soon as possible.  Feel free to call the clinic you have any questions or concerns. The clinic phone number is [phone].  Please show the CHEMO ALERT CARD at check-in to the Emergency Department and triage nurse.   

## 2015-07-25 NOTE — Progress Notes (Signed)
error 

## 2015-08-01 ENCOUNTER — Telehealth: Payer: Self-pay | Admitting: *Deleted

## 2015-08-01 NOTE — Telephone Encounter (Signed)
Patient called and wanted to move her appt from today to later this week. I have called her back. We moved her appt to Friday, patient aware of new date/time.

## 2015-08-02 ENCOUNTER — Ambulatory Visit: Payer: BLUE CROSS/BLUE SHIELD

## 2015-08-04 ENCOUNTER — Encounter: Payer: Self-pay | Admitting: *Deleted

## 2015-08-04 ENCOUNTER — Other Ambulatory Visit: Payer: Self-pay | Admitting: *Deleted

## 2015-08-04 DIAGNOSIS — C50412 Malignant neoplasm of upper-outer quadrant of left female breast: Secondary | ICD-10-CM

## 2015-08-05 ENCOUNTER — Ambulatory Visit (HOSPITAL_BASED_OUTPATIENT_CLINIC_OR_DEPARTMENT_OTHER): Payer: BLUE CROSS/BLUE SHIELD

## 2015-08-05 VITALS — BP 113/71 | HR 73 | Temp 97.7°F | Resp 18

## 2015-08-05 DIAGNOSIS — Z23 Encounter for immunization: Secondary | ICD-10-CM | POA: Diagnosis not present

## 2015-08-05 DIAGNOSIS — Z5112 Encounter for antineoplastic immunotherapy: Secondary | ICD-10-CM

## 2015-08-05 DIAGNOSIS — C50412 Malignant neoplasm of upper-outer quadrant of left female breast: Secondary | ICD-10-CM

## 2015-08-05 MED ORDER — DIPHENHYDRAMINE HCL 25 MG PO CAPS
ORAL_CAPSULE | ORAL | Status: AC
Start: 2015-08-05 — End: 2015-08-05
  Filled 2015-08-05: qty 2

## 2015-08-05 MED ORDER — INFLUENZA VAC SPLIT QUAD 0.5 ML IM SUSY
0.5000 mL | PREFILLED_SYRINGE | INTRAMUSCULAR | Status: AC
  Administered 2015-08-05: 0.5 mL via INTRAMUSCULAR
  Filled 2015-08-05: qty 0.5

## 2015-08-05 MED ORDER — HEPARIN SOD (PORK) LOCK FLUSH 100 UNIT/ML IV SOLN
500.0000 [IU] | Freq: Once | INTRAVENOUS | Status: AC | PRN
  Administered 2015-08-05: 500 [IU]
  Filled 2015-08-05: qty 5

## 2015-08-05 MED ORDER — SODIUM CHLORIDE 0.9 % IJ SOLN
10.0000 mL | INTRAMUSCULAR | Status: DC | PRN
  Administered 2015-08-05: 10 mL
  Filled 2015-08-05: qty 10

## 2015-08-05 MED ORDER — ACETAMINOPHEN 325 MG PO TABS
ORAL_TABLET | ORAL | Status: AC
  Filled 2015-08-05: qty 2

## 2015-08-05 MED ORDER — SODIUM CHLORIDE 0.9 % IV SOLN
Freq: Once | INTRAVENOUS | Status: AC
  Administered 2015-08-05: 09:00:00 via INTRAVENOUS

## 2015-08-05 MED ORDER — DIPHENHYDRAMINE HCL 25 MG PO CAPS
50.0000 mg | ORAL_CAPSULE | Freq: Once | ORAL | Status: AC
  Administered 2015-08-05: 50 mg via ORAL

## 2015-08-05 MED ORDER — TRASTUZUMAB CHEMO INJECTION 440 MG
4.0000 mg/kg | Freq: Once | INTRAVENOUS | Status: AC
  Administered 2015-08-05: 294 mg via INTRAVENOUS
  Filled 2015-08-05: qty 14

## 2015-08-05 MED ORDER — ACETAMINOPHEN 325 MG PO TABS
650.0000 mg | ORAL_TABLET | Freq: Once | ORAL | Status: AC
  Administered 2015-08-05: 650 mg via ORAL

## 2015-08-05 NOTE — Patient Instructions (Signed)
[facility] Discharge Instructions for Patients Receiving Chemotherapy  Today you received the following chemotherapy agents: Herceptin   To help prevent nausea and vomiting after your treatment, we encourage you to take your nausea medication as directed.    If you develop nausea and vomiting that is not controlled by your nausea medication, call the clinic.   BELOW ARE SYMPTOMS THAT SHOULD BE REPORTED IMMEDIATELY:  *FEVER GREATER THAN 100.5 F  *CHILLS WITH OR WITHOUT FEVER  NAUSEA AND VOMITING THAT IS NOT CONTROLLED WITH YOUR NAUSEA MEDICATION  *UNUSUAL SHORTNESS OF BREATH  *UNUSUAL BRUISING OR BLEEDING  TENDERNESS IN MOUTH AND THROAT WITH OR WITHOUT PRESENCE OF ULCERS  *URINARY PROBLEMS  *BOWEL PROBLEMS  UNUSUAL RASH Items with * indicate a potential emergency and should be followed up as soon as possible.  Feel free to call the clinic you have any questions or concerns. The clinic phone number is [phone].  Please show the CHEMO ALERT CARD at check-in to the Emergency Department and triage nurse.   

## 2015-08-09 ENCOUNTER — Other Ambulatory Visit: Payer: Self-pay | Admitting: General Surgery

## 2015-08-22 ENCOUNTER — Other Ambulatory Visit: Payer: Self-pay | Admitting: *Deleted

## 2015-08-22 DIAGNOSIS — C50412 Malignant neoplasm of upper-outer quadrant of left female breast: Secondary | ICD-10-CM

## 2015-08-22 NOTE — Assessment & Plan Note (Signed)
Left breast invasive ductal carcinoma grade 3 ER 0% PR 0% HER-2 positive ratio 3.74, Ki-67 90%: 2.3 cm mass by ultrasound clinical stage: T2, N0, M0 stage II A. Status post left lumpectomy 01/16/2015: Negative for in situ or invasive breast cancer 0/4 lymph nodes, complete pathologic response  Chemotherapy summary: Completed 6 cycles of neoadjuvant chemotherapy with Taxotere, carboplatin, Herceptin and Perjeta every 3 weeks started 08/28/15. Herceptin maintenance therapy started 01/07/2015 Completed adjuvant radiation 05/13/2015  Plan: 1. Continue maintenance Herceptin (at a reduced dosage of 4 mg/kg because of diarrhea) to complete end of October 2016 2. Echocardiogram EF 55-60% (06/17/15). As soon as Herceptin maintenance is complete I will request Dr. Barry Dienes to remove her port.  Cellulitis of right great toe: Currently on doxycycline antibiotics Return to clinic every 6 weeks for follow-up

## 2015-08-23 ENCOUNTER — Encounter: Payer: Self-pay | Admitting: *Deleted

## 2015-08-23 ENCOUNTER — Other Ambulatory Visit (HOSPITAL_BASED_OUTPATIENT_CLINIC_OR_DEPARTMENT_OTHER): Payer: BLUE CROSS/BLUE SHIELD

## 2015-08-23 ENCOUNTER — Encounter: Payer: Self-pay | Admitting: Hematology and Oncology

## 2015-08-23 ENCOUNTER — Ambulatory Visit (HOSPITAL_BASED_OUTPATIENT_CLINIC_OR_DEPARTMENT_OTHER): Payer: BLUE CROSS/BLUE SHIELD | Admitting: Hematology and Oncology

## 2015-08-23 ENCOUNTER — Other Ambulatory Visit: Payer: Self-pay | Admitting: *Deleted

## 2015-08-23 ENCOUNTER — Telehealth: Payer: Self-pay | Admitting: Hematology and Oncology

## 2015-08-23 ENCOUNTER — Ambulatory Visit (HOSPITAL_BASED_OUTPATIENT_CLINIC_OR_DEPARTMENT_OTHER): Payer: BLUE CROSS/BLUE SHIELD

## 2015-08-23 VITALS — BP 133/75 | HR 82 | Temp 98.4°F | Resp 18 | Ht 62.0 in | Wt 165.5 lb

## 2015-08-23 DIAGNOSIS — C50412 Malignant neoplasm of upper-outer quadrant of left female breast: Secondary | ICD-10-CM | POA: Diagnosis not present

## 2015-08-23 DIAGNOSIS — D696 Thrombocytopenia, unspecified: Secondary | ICD-10-CM

## 2015-08-23 DIAGNOSIS — Z5112 Encounter for antineoplastic immunotherapy: Secondary | ICD-10-CM | POA: Diagnosis not present

## 2015-08-23 LAB — CBC WITH DIFFERENTIAL/PLATELET
BASO%: 0.4 % (ref 0.0–2.0)
BASOS ABS: 0 10*3/uL (ref 0.0–0.1)
EOS%: 4.8 % (ref 0.0–7.0)
Eosinophils Absolute: 0.2 10*3/uL (ref 0.0–0.5)
HEMATOCRIT: 37.8 % (ref 34.8–46.6)
HGB: 12.7 g/dL (ref 11.6–15.9)
LYMPH#: 0.9 10*3/uL (ref 0.9–3.3)
LYMPH%: 23.1 % (ref 14.0–49.7)
MCH: 31.3 pg (ref 25.1–34.0)
MCHC: 33.5 g/dL (ref 31.5–36.0)
MCV: 93.5 fL (ref 79.5–101.0)
MONO#: 0.3 10*3/uL (ref 0.1–0.9)
MONO%: 8.3 % (ref 0.0–14.0)
NEUT#: 2.4 10*3/uL (ref 1.5–6.5)
NEUT%: 63.4 % (ref 38.4–76.8)
PLATELETS: 201 10*3/uL (ref 145–400)
RBC: 4.04 10*6/uL (ref 3.70–5.45)
RDW: 13.1 % (ref 11.2–14.5)
WBC: 3.8 10*3/uL — ABNORMAL LOW (ref 3.9–10.3)

## 2015-08-23 LAB — COMPREHENSIVE METABOLIC PANEL (CC13)
ALT: 19 U/L (ref 0–55)
AST: 18 U/L (ref 5–34)
Albumin: 3.7 g/dL (ref 3.5–5.0)
Alkaline Phosphatase: 96 U/L (ref 40–150)
Anion Gap: 7 mEq/L (ref 3–11)
BILIRUBIN TOTAL: 0.42 mg/dL (ref 0.20–1.20)
BUN: 17.3 mg/dL (ref 7.0–26.0)
CO2: 28 meq/L (ref 22–29)
Calcium: 9.6 mg/dL (ref 8.4–10.4)
Chloride: 107 mEq/L (ref 98–109)
Creatinine: 1 mg/dL (ref 0.6–1.1)
EGFR: 65 mL/min/{1.73_m2} — AB (ref >90)
GLUCOSE: 101 mg/dL (ref 70–140)
Potassium: 3.7 mEq/L (ref 3.5–5.1)
SODIUM: 142 meq/L (ref 136–145)
TOTAL PROTEIN: 6.7 g/dL (ref 6.4–8.3)

## 2015-08-23 MED ORDER — HEPARIN SOD (PORK) LOCK FLUSH 100 UNIT/ML IV SOLN
500.0000 [IU] | Freq: Once | INTRAVENOUS | Status: AC | PRN
  Administered 2015-08-23: 500 [IU]
  Filled 2015-08-23: qty 5

## 2015-08-23 MED ORDER — SODIUM CHLORIDE 0.9 % IJ SOLN
10.0000 mL | INTRAMUSCULAR | Status: DC | PRN
  Administered 2015-08-23: 10 mL
  Filled 2015-08-23: qty 10

## 2015-08-23 MED ORDER — DIPHENHYDRAMINE HCL 25 MG PO CAPS
50.0000 mg | ORAL_CAPSULE | Freq: Once | ORAL | Status: AC
Start: 2015-08-23 — End: 2015-08-23
  Administered 2015-08-23: 50 mg via ORAL

## 2015-08-23 MED ORDER — SODIUM CHLORIDE 0.9 % IV SOLN
Freq: Once | INTRAVENOUS | Status: AC
  Administered 2015-08-23: 10:00:00 via INTRAVENOUS

## 2015-08-23 MED ORDER — ACETAMINOPHEN 325 MG PO TABS
650.0000 mg | ORAL_TABLET | Freq: Once | ORAL | Status: AC
  Administered 2015-08-23: 650 mg via ORAL

## 2015-08-23 MED ORDER — ACETAMINOPHEN 325 MG PO TABS
ORAL_TABLET | ORAL | Status: AC
  Filled 2015-08-23: qty 2

## 2015-08-23 MED ORDER — TRASTUZUMAB CHEMO INJECTION 440 MG
4.0000 mg/kg | Freq: Once | INTRAVENOUS | Status: AC
  Administered 2015-08-23: 294 mg via INTRAVENOUS
  Filled 2015-08-23: qty 14

## 2015-08-23 MED ORDER — DIPHENHYDRAMINE HCL 25 MG PO CAPS
ORAL_CAPSULE | ORAL | Status: AC
  Filled 2015-08-23: qty 2

## 2015-08-23 NOTE — Telephone Encounter (Signed)
Appointments made and avs printed for patient °

## 2015-08-23 NOTE — Progress Notes (Signed)
Patient Care Team: Seward Carol, MD as PCP - General (Internal Medicine) [name], MD as Consulting Physician (Surgical Oncology) Thea Silversmith, MD as Consulting Physician (Radiation Oncology) Nicholas Lose, MD as Consulting Physician (Hematology and Oncology)  DIAGNOSIS: Breast cancer of upper-outer quadrant of left female breast [facility])   Staging form: Breast, AJCC 7th Edition     Clinical: Stage IIA (T2, N0, cM0) - Unsigned       Staging comments: Staged at breast conference 08/11/14.      Pathologic stage from 01/20/2015: Stage Unknown (yTX, N0, cM0) - Signed by Enid Cutter, MD on 01/25/2015       Staging comments: Staged on final lumpectomy specimen by Dr. Donato Heinz    SUMMARY OF ONCOLOGIC HISTORY:   Breast cancer of upper-outer quadrant of left female breast ([place])   08/05/2014 Mammogram Left breast 10 o clock: 2.3X 2X1.4 cm lobular hypoechoeic   08/05/2014 Initial Biopsy Grade 3 IDC Er 0%, PR 0%, Her 2 positive Ratio 3.74; Ki 67: 90%   08/17/2014 Breast MRI Left breast known malignancy upper inner quadrant 2.6 cm: Suspicious non-mass enhancement upper-inner quadrant right breast biopsy pending   08/27/2014 - 12/17/2014 Chemotherapy Neoadjuvant chemotherapy with Taxotere, carboplatin, Herceptin, Perjeta every 3 weeks x6 cycles of Herceptin maintenance for one year.   12/27/2014 Breast MRI Significant treatment response. Previously biopsied mass is no longer visible, residual clumped linearly oriented non-mass enhancement anterior to the biopsied mass.   01/18/2015 Surgery  left breast lumpectomy: negative for in situ and invasive cancer complete pathologic response   03/29/2015 - 05/13/2015 Radiation Therapy Adj XRT    CHIEF COMPLIANT: Last Herceptin treatment  INTERVAL HISTORY: Julie [name] is a 59 year old with above-mentioned history of left breast cancer treated with neoadjuvant chemotherapy and had a complete response to treatment she underwent lumpectomy and adjuvant radiation and  has been on maintenance Herceptin. Today is the last Herceptin maintenance therapy. She is very excited about it. Her toe infection is completely resolved.  REVIEW OF SYSTEMS:   Constitutional: Denies fevers, chills or abnormal weight loss Eyes: Denies blurriness of vision Ears, nose, mouth, throat, and face: Denies mucositis or sore throat Respiratory: Denies cough, dyspnea or wheezes Cardiovascular: Denies palpitation, chest discomfort or lower extremity swelling Gastrointestinal:  Denies nausea, heartburn or change in bowel habits Skin: Denies abnormal skin rashes Lymphatics: Denies new lymphadenopathy or easy bruising Neurological:Denies numbness, tingling or new weaknesses Behavioral/Psych: Mood is stable, no new changes  Breast:  denies any pain or lumps or nodules in either breasts All other systems were reviewed with the patient and are negative.  I have reviewed the past medical history, past surgical history, social history and family history with the patient and they are unchanged from previous note.  ALLERGIES:  is allergic to codeine; sulfa antibiotics; and penicillins.  MEDICATIONS:  Current Outpatient Prescriptions  Medication Sig Dispense Refill  . acetaminophen (TYLENOL) 500 MG tablet Take 500 mg by mouth every 6 (six) hours as needed.    Marland Kitchen buPROPion (WELLBUTRIN SR) 150 MG 12 hr tablet Take 150 mg by mouth every evening.     Marland Kitchen esomeprazole (NEXIUM) 40 MG capsule Take 1 capsule (40 mg total) by mouth daily at 12 noon. 30 capsule 3  . fluticasone (FLONASE) 50 MCG/ACT nasal spray Place into both nostrils as needed for allergies or rhinitis.    Marland Kitchen loperamide (IMODIUM) 2 MG capsule Take 1 capsule (2 mg total) by mouth as needed for diarrhea or loose stools. (Patient not taking: Reported  on 06/01/2015) 30 capsule 2  . LORazepam (ATIVAN) 0.5 MG tablet Take 1 tablet (0.5 mg total) by mouth every 6 (six) hours as needed (Nausea or vomiting). (Patient not taking: Reported on 06/01/2015)  30 tablet 0  . ondansetron (ZOFRAN) 8 MG tablet Take 1 tablet (8 mg total) by mouth 2 (two) times daily. Start the day after chemo for 3 days. Then take as needed for nausea or vomiting. (Patient not taking: Reported on 06/01/2015) 30 tablet 1  . prochlorperazine (COMPAZINE) 10 MG tablet TAKE 1 TABLET EVERY 6 HOURS AS NEEDED FOR NAUSEA & VOMITING. (Patient not taking: Reported on 06/01/2015) 30 tablet 0  . simvastatin (ZOCOR) 20 MG tablet Take 20 mg by mouth daily at 6 PM.     . SOOLANTRA 1 % CREA   0   No current facility-administered medications for this visit.   Facility-Administered Medications Ordered in Other Visits  Medication Dose Route Frequency Provider Last Rate Last Dose  . heparin lock flush 100 unit/mL  250 Units Intracatheter PRN Susanne Borders, NP      . Influenza vac split quadrivalent PF (FLUARIX) injection 0.5 mL  0.5 mL Intramuscular Tomorrow-1000 Nicholas Lose, MD        PHYSICAL EXAMINATION: ECOG PERFORMANCE STATUS: 0 - Asymptomatic  Filed Vitals:   08/23/15 0911  BP: 133/75  Pulse: 82  Temp: 98.4 F (36.9 C)  Resp: 18   Filed Weights   08/23/15 0911  Weight: 165 lb 8 oz (75.07 kg)    GENERAL:alert, no distress and comfortable SKIN: skin color, texture, turgor are normal, no rashes or significant lesions EYES: normal, Conjunctiva are pink and non-injected, sclera clear OROPHARYNX:no exudate, no erythema and lips, buccal mucosa, and tongue normal  NECK: supple, thyroid normal size, non-tender, without nodularity LYMPH:  no palpable lymphadenopathy in the cervical, axillary or inguinal LUNGS: clear to auscultation and percussion with normal breathing effort HEART: regular rate & rhythm and no murmurs and no lower extremity edema ABDOMEN:abdomen soft, non-tender and normal bowel sounds Musculoskeletal:no cyanosis of digits and no clubbing  NEURO: alert & oriented x 3 with fluent speech, no focal motor/sensory deficits  LABORATORY DATA:  I have reviewed the  data as listed   Chemistry      Component Value Date/Time   NA 142 08/23/2015 0853   NA 140 05/04/2015 1245   K 3.7 08/23/2015 0853   K 3.9 05/04/2015 1245   CL 105 05/04/2015 1245   CO2 28 08/23/2015 0853   CO2 30 05/04/2015 1245   BUN 17.3 08/23/2015 0853   BUN 12 05/04/2015 1245   CREATININE 1.0 08/23/2015 0853   CREATININE 0.82 05/04/2015 1245      Component Value Date/Time   CALCIUM 9.6 08/23/2015 0853   CALCIUM 9.5 05/04/2015 1245   ALKPHOS 96 08/23/2015 0853   ALKPHOS 97 05/04/2015 1245   AST 18 08/23/2015 0853   AST 19 05/04/2015 1245   ALT 19 08/23/2015 0853   ALT 19 05/04/2015 1245   BILITOT 0.42 08/23/2015 0853   BILITOT 0.5 05/04/2015 1245       Lab Results  Component Value Date   WBC 3.8* 08/23/2015   HGB 12.7 08/23/2015   HCT 37.8 08/23/2015   MCV 93.5 08/23/2015   PLT 201 08/23/2015   NEUTROABS 2.4 08/23/2015   ASSESSMENT & PLAN:  Breast cancer of upper-outer quadrant of left female breast Left breast invasive ductal carcinoma grade 3 ER 0% PR 0% HER-2 positive ratio 3.74, Ki-67 90%:  2.3 cm mass by ultrasound clinical stage: T2, N0, M0 stage II A. Status post left lumpectomy 01/16/2015: Negative for in situ or invasive breast cancer 0/4 lymph nodes, complete pathologic response  Chemotherapy summary: Completed 6 cycles of neoadjuvant chemotherapy with Taxotere, carboplatin, Herceptin and Perjeta every 3 weeks started 08/28/15. Herceptin maintenance therapy started 01/07/2015 Completed adjuvant radiation 05/13/2015  Plan: 1. Completed maintenance Herceptin (at a reduced dosage of 4 mg/kg because of diarrhea) as of today 2. Echocardiogram EF 55-60% (06/17/15). Port will be removed in November  Cellulitis of right great toe: Resolved with doxycycline antibiotics Return to clinic in 6 months for follow-up   No orders of the defined types were placed in this encounter.   The patient has a good understanding of the overall plan. she agrees with it.  she will call with any problems that may develop before the next visit here.   Rulon Eisenmenger, MD 08/23/2015

## 2015-08-23 NOTE — Patient Instructions (Signed)
[facility] Discharge Instructions for Patients Receiving Chemotherapy  Today you received the following chemotherapy agents:  Herceptin  To help prevent nausea and vomiting after your treatment, we encourage you to take your nausea medication as prescribed.   If you develop nausea and vomiting that is not controlled by your nausea medication, call the clinic.   BELOW ARE SYMPTOMS THAT SHOULD BE REPORTED IMMEDIATELY:  *FEVER GREATER THAN 100.5 F  *CHILLS WITH OR WITHOUT FEVER  NAUSEA AND VOMITING THAT IS NOT CONTROLLED WITH YOUR NAUSEA MEDICATION  *UNUSUAL SHORTNESS OF BREATH  *UNUSUAL BRUISING OR BLEEDING  TENDERNESS IN MOUTH AND THROAT WITH OR WITHOUT PRESENCE OF ULCERS  *URINARY PROBLEMS  *BOWEL PROBLEMS  UNUSUAL RASH Items with * indicate a potential emergency and should be followed up as soon as possible.  Feel free to call the clinic you have any questions or concerns. The clinic phone number is [phone].  Please show the CHEMO ALERT CARD at check-in to the Emergency Department and triage nurse.   

## 2015-09-08 ENCOUNTER — Encounter (HOSPITAL_BASED_OUTPATIENT_CLINIC_OR_DEPARTMENT_OTHER): Payer: Self-pay | Admitting: *Deleted

## 2015-09-14 ENCOUNTER — Ambulatory Visit (HOSPITAL_BASED_OUTPATIENT_CLINIC_OR_DEPARTMENT_OTHER): Payer: BLUE CROSS/BLUE SHIELD | Admitting: Anesthesiology

## 2015-09-14 ENCOUNTER — Ambulatory Visit (HOSPITAL_BASED_OUTPATIENT_CLINIC_OR_DEPARTMENT_OTHER)
Admission: RE | Admit: 2015-09-14 | Discharge: 2015-09-14 | Disposition: A | Discharge status: Home / Self Care | Payer: BLUE CROSS/BLUE SHIELD | Source: Ambulatory Visit | Attending: General Surgery | Admitting: General Surgery

## 2015-09-14 ENCOUNTER — Encounter (HOSPITAL_BASED_OUTPATIENT_CLINIC_OR_DEPARTMENT_OTHER)
Admission: RE | Disposition: A | Discharge status: Home / Self Care | Payer: Self-pay | Source: Ambulatory Visit | Attending: General Surgery

## 2015-09-14 ENCOUNTER — Encounter (HOSPITAL_BASED_OUTPATIENT_CLINIC_OR_DEPARTMENT_OTHER): Payer: Self-pay | Admitting: *Deleted

## 2015-09-14 DIAGNOSIS — K219 Gastro-esophageal reflux disease without esophagitis: Secondary | ICD-10-CM | POA: Diagnosis not present

## 2015-09-14 DIAGNOSIS — I340 Nonrheumatic mitral (valve) insufficiency: Secondary | ICD-10-CM | POA: Diagnosis not present

## 2015-09-14 DIAGNOSIS — Z880 Allergy status to penicillin: Secondary | ICD-10-CM | POA: Insufficient documentation

## 2015-09-14 DIAGNOSIS — Z79899 Other long term (current) drug therapy: Secondary | ICD-10-CM | POA: Insufficient documentation

## 2015-09-14 DIAGNOSIS — Z882 Allergy status to sulfonamides status: Secondary | ICD-10-CM | POA: Insufficient documentation

## 2015-09-14 DIAGNOSIS — Z853 Personal history of malignant neoplasm of breast: Secondary | ICD-10-CM | POA: Diagnosis not present

## 2015-09-14 DIAGNOSIS — Z803 Family history of malignant neoplasm of breast: Secondary | ICD-10-CM | POA: Diagnosis not present

## 2015-09-14 DIAGNOSIS — Z452 Encounter for adjustment and management of vascular access device: Secondary | ICD-10-CM | POA: Insufficient documentation

## 2015-09-14 DIAGNOSIS — Z885 Allergy status to narcotic agent status: Secondary | ICD-10-CM | POA: Insufficient documentation

## 2015-09-14 HISTORY — PX: PORT-A-CATH REMOVAL: SHX5289

## 2015-09-14 SURGERY — REMOVAL PORT-A-CATH
Anesthesia: Monitor Anesthesia Care | Site: Chest | Laterality: Left

## 2015-09-14 MED ORDER — BUPIVACAINE HCL (PF) 0.25 % IJ SOLN
INTRAMUSCULAR | Status: DC | PRN
  Administered 2015-09-14: 3.5 mL

## 2015-09-14 MED ORDER — MIDAZOLAM HCL 5 MG/5ML IJ SOLN
INTRAMUSCULAR | Status: DC | PRN
  Administered 2015-09-14: 2 mg via INTRAVENOUS

## 2015-09-14 MED ORDER — DEXAMETHASONE SODIUM PHOSPHATE 10 MG/ML IJ SOLN
INTRAMUSCULAR | Status: AC
  Filled 2015-09-14: qty 1

## 2015-09-14 MED ORDER — MIDAZOLAM HCL 2 MG/2ML IJ SOLN
INTRAMUSCULAR | Status: AC
  Filled 2015-09-14: qty 2

## 2015-09-14 MED ORDER — ONDANSETRON HCL 4 MG/2ML IJ SOLN
INTRAMUSCULAR | Status: AC
  Filled 2015-09-14: qty 2

## 2015-09-14 MED ORDER — FENTANYL CITRATE (PF) 100 MCG/2ML IJ SOLN
INTRAMUSCULAR | Status: AC
  Filled 2015-09-14: qty 2

## 2015-09-14 MED ORDER — GLYCOPYRROLATE 0.2 MG/ML IJ SOLN: 0.2000 mg | Freq: Once | INTRAMUSCULAR | Status: DC | PRN

## 2015-09-14 MED ORDER — PROPOFOL 10 MG/ML IV BOLUS
INTRAVENOUS | Status: DC | PRN
  Administered 2015-09-14: 10 mg via INTRAVENOUS
  Administered 2015-09-14: 20 mg via INTRAVENOUS

## 2015-09-14 MED ORDER — FENTANYL CITRATE (PF) 100 MCG/2ML IJ SOLN: 25.0000 ug | INTRAMUSCULAR | Status: DC | PRN

## 2015-09-14 MED ORDER — MIDAZOLAM HCL 2 MG/2ML IJ SOLN: 1.0000 mg | INTRAMUSCULAR | Status: DC | PRN

## 2015-09-14 MED ORDER — LACTATED RINGERS IV SOLN
INTRAVENOUS | Status: DC
  Administered 2015-09-14: 10:00:00 via INTRAVENOUS

## 2015-09-14 MED ORDER — CIPROFLOXACIN IN D5W 400 MG/200ML IV SOLN
400.0000 mg | INTRAVENOUS | Status: AC
  Administered 2015-09-14: 400 mg via INTRAVENOUS

## 2015-09-14 MED ORDER — PROPOFOL 10 MG/ML IV BOLUS
INTRAVENOUS | Status: AC
  Filled 2015-09-14: qty 40

## 2015-09-14 MED ORDER — LIDOCAINE HCL (CARDIAC) 20 MG/ML IV SOLN
INTRAVENOUS | Status: DC | PRN
  Administered 2015-09-14: 10 mg via INTRAVENOUS

## 2015-09-14 MED ORDER — LIDOCAINE-EPINEPHRINE (PF) 1 %-1:200000 IJ SOLN
INTRAMUSCULAR | Status: DC | PRN
  Administered 2015-09-14: 3.5 mL

## 2015-09-14 MED ORDER — BUPIVACAINE HCL (PF) 0.25 % IJ SOLN
INTRAMUSCULAR | Status: AC
  Filled 2015-09-14: qty 120

## 2015-09-14 MED ORDER — LIDOCAINE-EPINEPHRINE (PF) 1 %-1:200000 IJ SOLN
INTRAMUSCULAR | Status: AC
  Filled 2015-09-14: qty 120

## 2015-09-14 MED ORDER — ONDANSETRON HCL 4 MG/2ML IJ SOLN
INTRAMUSCULAR | Status: DC | PRN
  Administered 2015-09-14: 4 mg via INTRAVENOUS

## 2015-09-14 MED ORDER — CIPROFLOXACIN IN D5W 400 MG/200ML IV SOLN
INTRAVENOUS | Status: AC
  Filled 2015-09-14: qty 200

## 2015-09-14 MED ORDER — MEPERIDINE HCL 25 MG/ML IJ SOLN: 6.2500 mg | INTRAMUSCULAR | Status: DC | PRN

## 2015-09-14 MED ORDER — FENTANYL CITRATE (PF) 100 MCG/2ML IJ SOLN: 50.0000 ug | INTRAMUSCULAR | Status: DC | PRN

## 2015-09-14 MED ORDER — OXYCODONE-ACETAMINOPHEN 5-325 MG PO TABS: 1.0000 | ORAL_TABLET | ORAL | Status: DC | PRN

## 2015-09-14 MED ORDER — LIDOCAINE HCL (CARDIAC) 20 MG/ML IV SOLN
INTRAVENOUS | Status: AC
  Filled 2015-09-14: qty 5

## 2015-09-14 MED ORDER — SCOPOLAMINE 1 MG/3DAYS TD PT72: 1.0000 | MEDICATED_PATCH | Freq: Once | TRANSDERMAL | Status: DC | PRN

## 2015-09-14 SURGICAL SUPPLY — 34 items
BLADE HEX COATED 2.75 (ELECTRODE) ×2 IMPLANT
BLADE SURG 15 STRL LF DISP TIS (BLADE) ×1 IMPLANT
BLADE SURG 15 STRL SS (BLADE) ×1
CANISTER SUCT 1200ML W/VALVE (MISCELLANEOUS) IMPLANT
CHLORAPREP W/TINT 26ML (MISCELLANEOUS) ×2 IMPLANT
COVER BACK TABLE 60X90IN (DRAPES) ×2 IMPLANT
COVER MAYO STAND STRL (DRAPES) ×2 IMPLANT
DECANTER SPIKE VIAL GLASS SM (MISCELLANEOUS) IMPLANT
DRAPE LAPAROTOMY 100X72 PEDS (DRAPES) ×2 IMPLANT
DRAPE UTILITY XL STRL (DRAPES) ×2 IMPLANT
ELECT REM PT RETURN 9FT ADLT (ELECTROSURGICAL) ×2
ELECTRODE REM PT RTRN 9FT ADLT (ELECTROSURGICAL) ×1 IMPLANT
GLOVE BIO SURGEON STRL SZ 6 (GLOVE) ×2 IMPLANT
GLOVE BIOGEL PI IND STRL 6.5 (GLOVE) ×1 IMPLANT
GLOVE BIOGEL PI IND STRL 7.0 (GLOVE) ×2 IMPLANT
GLOVE BIOGEL PI INDICATOR 6.5 (GLOVE) ×1
GLOVE BIOGEL PI INDICATOR 7.0 (GLOVE) ×2
GLOVE ECLIPSE 6.5 STRL STRAW (GLOVE) ×2 IMPLANT
GOWN STRL REUS W/ TWL LRG LVL3 (GOWN DISPOSABLE) ×1 IMPLANT
GOWN STRL REUS W/TWL 2XL LVL3 (GOWN DISPOSABLE) ×2 IMPLANT
GOWN STRL REUS W/TWL LRG LVL3 (GOWN DISPOSABLE) ×1
LIQUID BAND (GAUZE/BANDAGES/DRESSINGS) ×2 IMPLANT
NEEDLE HYPO 25X1 1.5 SAFETY (NEEDLE) ×2 IMPLANT
NS IRRIG 1000ML POUR BTL (IV SOLUTION) IMPLANT
PACK BASIN DAY SURGERY FS (CUSTOM PROCEDURE TRAY) ×2 IMPLANT
PENCIL BUTTON HOLSTER BLD 10FT (ELECTRODE) ×2 IMPLANT
SUT MNCRL AB 4-0 PS2 18 (SUTURE) ×2 IMPLANT
SUT VIC AB 3-0 SH 27 (SUTURE) ×1
SUT VIC AB 3-0 SH 27X BRD (SUTURE) ×1 IMPLANT
SYR CONTROL 10ML LL (SYRINGE) ×2 IMPLANT
TOWEL OR 17X24 6PK STRL BLUE (TOWEL DISPOSABLE) ×2 IMPLANT
TOWEL OR NON WOVEN STRL DISP B (DISPOSABLE) ×2 IMPLANT
TUBE CONNECTING 20X1/4 (TUBING) IMPLANT
YANKAUER SUCT BULB TIP NO VENT (SUCTIONS) IMPLANT

## 2015-09-14 NOTE — Anesthesia Preprocedure Evaluation (Signed)
Anesthesia Evaluation  Patient identified by MRN, date of birth, ID band Patient awake    Reviewed: Allergy & Precautions, NPO status , Patient's Chart, lab work & pertinent test results  History of Anesthesia Complications (+) PONV  Airway Mallampati: I  TM Distance: >3 FB Neck ROM: Full    Dental  (+) Teeth Intact, Dental Advisory Given   Pulmonary    breath sounds clear to auscultation       Cardiovascular + Valvular Problems/Murmurs MR  Rhythm:Regular Rate:Normal     Neuro/Psych    GI/Hepatic GERD  Medicated,  Endo/Other    Renal/GU      Musculoskeletal   Abdominal   Peds  Hematology   Anesthesia Other Findings   Reproductive/Obstetrics                             Anesthesia Physical Anesthesia Plan  ASA: II  Anesthesia Plan: MAC   Post-op Pain Management:    Induction: Intravenous  Airway Management Planned: Simple Face Mask  Additional Equipment:   Intra-op Plan:   Post-operative Plan:   Informed Consent: I have reviewed the patients History and Physical, chart, labs and discussed the procedure including the risks, benefits and alternatives for the proposed anesthesia with the patient or authorized representative who has indicated his/her understanding and acceptance.   Dental advisory given  Plan Discussed with: CRNA, Anesthesiologist and Surgeon  Anesthesia Plan Comments:         Anesthesia Quick Evaluation

## 2015-09-14 NOTE — Anesthesia Procedure Notes (Signed)
Procedure Name: MAC Date/Time: 09/14/2015 10:26 AM Performed by: Marrianne Mood Pre-anesthesia Checklist: Patient identified, Timeout performed, Emergency Drugs available, Suction available and Patient being monitored Patient Re-evaluated:Patient Re-evaluated prior to inductionOxygen Delivery Method: Simple face mask

## 2015-09-14 NOTE — Op Note (Signed)
  PRE-OPERATIVE DIAGNOSIS:  un-needed Port-A-Cath for left breast cancer  POST-OPERATIVE DIAGNOSIS:  Same   PROCEDURE:  Procedure(s):  REMOVAL PORT-A-CATH  SURGEON:  Surgeon(s):  [name], MD  ANESTHESIA:   MAC + local  EBL:   Minimal  SPECIMEN:  None  Complications : none known  Procedure:   Pt was  identified in the holding area and taken to the operating room where she was placed supine on the operating room table.  MAC anesthesia was induced.  The left upper chest was prepped and draped.  The prior incision was anesthetized with local anesthetic.  The incision was opened with a #15 blade.  The subcutaneous tissue was divided with the cautery.  The port was identified and the capsule opened.  The four 2-0 prolene sutures were removed.  The port was then removed and pressure held on the tract.  The catheter appeared intact without evidence of breakage with catheter length of 18.5.  The wound was inspected for hemostasis, which was achieved with cautery.  The wound was closed with 3-0 vicryl deep dermal interrupted sutures and 4-0 Monocryl running subcuticular suture.  The wound was cleaned, dried, and dressed with dermabond.  The patient was awakened from anesthesia and taken to the PACU in stable condition.  Needle, sponge, and instrument counts are correct.

## 2015-09-14 NOTE — Discharge Instructions (Addendum)
[facility] °Office Phone Number [phone] ° ° POST OP INSTRUCTIONS ° °Always review your discharge instruction sheet given to you by the facility where your surgery was performed. ° °IF YOU HAVE DISABILITY OR FAMILY LEAVE FORMS, YOU MUST BRING THEM TO THE OFFICE FOR PROCESSING.  DO NOT GIVE THEM TO YOUR DOCTOR. ° °1. A prescription for pain medication may be given to you upon discharge.  Take your pain medication as prescribed, if needed.  If narcotic pain medicine is not needed, then you may take acetaminophen (Tylenol) or ibuprofen (Advil) as needed. °2. Take your usually prescribed medications unless otherwise directed °3. If you need a refill on your pain medication, please contact your pharmacy.  They will contact our office to request authorization.  Prescriptions will not be filled after 5pm or on week-ends. °4. You should eat very light the first 24 hours after surgery, such as soup, crackers, pudding, etc.  Resume your normal diet the day after surgery °5. It is common to experience some constipation if taking pain medication after surgery.  Increasing fluid intake and taking a stool softener will usually help or prevent this problem from occurring.  A mild laxative (Milk of Magnesia or Miralax) should be taken according to package directions if there are no bowel movements after 48 hours. °6. You may shower in 48 hours.  The surgical glue will flake off in 2-3 weeks.   °7. ACTIVITIES:  No strenuous activity or heavy lifting for 1 week.   °a. You may drive when you no longer are taking prescription pain medication, you can comfortably wear a seatbelt, and you can safely maneuver your car and apply brakes. °b. RETURN TO WORK:  _________1 week_____________ °You should see your doctor in the office for a follow-up appointment approximately three-four weeks after your surgery.   ° °WHEN TO CALL YOUR DOCTOR: °1. Fever over 101.0 °2. Nausea and/or vomiting. °3. Extreme swelling or  bruising. °4. Continued bleeding from incision. °5. Increased pain, redness, or drainage from the incision. ° °The clinic staff is available to answer your questions during regular business hours.  Please don’t hesitate to call and ask to speak to one of the nurses for clinical concerns.  If you have a medical emergency, go to the nearest emergency room or call 911.  A surgeon from [facility] is always on call at the hospital. ° °For further questions, please visit [email or web address]  ° ° °Post Anesthesia Home Care Instructions ° °Activity: °Get plenty of rest for the remainder of the day. A responsible adult should stay with you for 24 hours following the procedure.  °For the next 24 hours, DO NOT: °-Drive a car °-Operate machinery °-Drink alcoholic beverages °-Take any medication unless instructed by your physician °-Make any legal decisions or sign important papers. ° °Meals: °Start with liquid foods such as gelatin or soup. Progress to regular foods as tolerated. Avoid greasy, spicy, heavy foods. If nausea and/or vomiting occur, drink only clear liquids until the nausea and/or vomiting subsides. Call your physician if vomiting continues. ° °Special Instructions/Symptoms: °Your throat may feel dry or sore from the anesthesia or the breathing tube placed in your throat during surgery. If this causes discomfort, gargle with warm salt water. The discomfort should disappear within 24 hours. ° °If you had a scopolamine patch placed behind your ear for the management of post- operative nausea and/or vomiting: ° °1. The medication in the patch is effective for 72 hours, after which it should   removed.  Wrap patch in a tissue and discard in the trash. Wash hands thoroughly with soap and water. °2. You may remove the patch earlier than 72 hours if you experience unpleasant side effects which may include dry mouth, dizziness or visual disturbances. °3. Avoid touching the patch. Wash your hands with  soap and water after contact with the patch. °  ° °

## 2015-09-14 NOTE — Anesthesia Postprocedure Evaluation (Signed)
  Anesthesia Post-op Note  Patient: Julie [name]  Procedure(s) Performed: Procedure(s): REMOVAL PORT-A-CATH (Left)  Patient Location: PACU  Anesthesia Type: MAC   Level of Consciousness: awake, alert  and oriented  Airway and Oxygen Therapy: Patient Spontanous Breathing  Post-op Pain: none  Post-op Assessment: Post-op Vital signs reviewed  Post-op Vital Signs: Reviewed  Last Vitals:  Filed Vitals:   09/14/15 1150  BP: 153/91  Pulse: 64  Temp: 36.4 C  Resp: 18    Complications: No apparent anesthesia complications

## 2015-09-14 NOTE — H&P (Signed)
Julie [name] is an 59 y.o. female.   Chief Complaint: left breast cancer  HPI: patient is s/p neoadjuvant chemo for left breast cancer followed by breast conservation.  She has completed therapy.  She desires port removal.    Past Medical History  Diagnosis Date  . Uterine cyst   . Ovarian cyst   . Irregular menstrual cycle   . Hot flashes   . Breast cancer of upper-outer quadrant of left female breast ([place])   . Wears glasses   . PONV (postoperative nausea and vomiting)     severe  . Breast cancer ([place]) 08/05/14    left breast bx  . Allergy   . GERD (gastroesophageal reflux disease)     Past Surgical History  Procedure Laterality Date  . Uterine ablation  2006  . Cesarean section  1991  . Colonoscopy    . Diagnostic laparoscopy  1990    ovarian cysy  . Upper gi endoscopy    . Dilation and curettage of uterus    . Portacath placement Left 08/18/2014    Procedure: INSERTION PORT-A-CATH;  Surgeon: [name], MD;  Location: [place];  Service: General;  Laterality: Left;  . Radioactive seed guided mastectomy with axillary sentinel lymph node biopsy Left 01/18/2015    Procedure: RADIOACTIVE SEED GUIDED PARTIAL MASTECTOMY WITH AXILLARY SENTINEL LYMPH NODE BIOPSY;  Surgeon: [name], MD;  Location: [place];  Service: General;  Laterality: Left;    Family History  Problem Relation Age of Onset  . Breast cancer Maternal Grandmother     Dx 85s; Deceased 24s  . Breast cancer Paternal Aunt     Dx 21s; Deceased 87s  . Melanoma Paternal Aunt     deceased in teens   Social History:  reports that she has never smoked. She has never used smokeless tobacco. She reports that she drinks alcohol. She reports that she does not use illicit drugs.  Allergies:  Allergies  Allergen Reactions  . Codeine Nausea Only  . Sulfa Antibiotics Rash  . Penicillins Rash    Medications Prior to Admission  Medication Sig Dispense Refill  . acetaminophen  (TYLENOL) 500 MG tablet Take 500 mg by mouth every 6 (six) hours as needed.    Marland Kitchen buPROPion (WELLBUTRIN SR) 150 MG 12 hr tablet Take 150 mg by mouth every evening.     . fluticasone (FLONASE) 50 MCG/ACT nasal spray Place into both nostrils as needed for allergies or rhinitis.    Marland Kitchen simvastatin (ZOCOR) 20 MG tablet Take 20 mg by mouth daily at 6 PM.     . SOOLANTRA 1 % CREA   0    No results found for this or any previous visit (from the past 48 hour(s)). No results found.  Review of Systems  All other systems reviewed and are negative.   Height 5\' 2"  (1.575 m), weight 74.844 kg (165 lb). Physical Exam  Constitutional: She is oriented to person, place, and time. She appears well-developed and well-nourished. No distress.  HENT:  Head: Normocephalic and atraumatic.  Eyes: Pupils are equal, round, and reactive to light.  Neck: Neck supple.  Cardiovascular: Normal rate.   Respiratory: Effort normal. No respiratory distress.  Left chest wall port   Musculoskeletal: Normal range of motion.  Neurological: She is alert and oriented to person, place, and time.  Skin: She is not diaphoretic.  Psychiatric: She has a normal mood and affect. Her behavior is normal. Judgment and thought content  normal.     Assessment/Plan Left breast cancer  Chemo finished. Will remove left port a cath. Risks reviewed.  Julie [name] 09/14/2015, 10:02 AM

## 2015-09-14 NOTE — Transfer of Care (Signed)
Immediate Anesthesia Transfer of Care Note  Patient: Julie [name]  Procedure(s) Performed: Procedure(s): REMOVAL PORT-A-CATH (Left)  Patient Location: PACU  Anesthesia Type:MAC  Level of Consciousness: awake and patient cooperative  Airway & Oxygen Therapy: Patient Spontanous Breathing and Patient connected to face mask oxygen  Post-op Assessment: Report given to RN and Post -op Vital signs reviewed and stable  Post vital signs: Reviewed and stable  Last Vitals: There were no vitals filed for this visit.  Complications: No apparent anesthesia complications

## 2015-09-15 ENCOUNTER — Encounter (HOSPITAL_BASED_OUTPATIENT_CLINIC_OR_DEPARTMENT_OTHER): Payer: Self-pay | Admitting: General Surgery

## 2015-09-16 ENCOUNTER — Ambulatory Visit (HOSPITAL_BASED_OUTPATIENT_CLINIC_OR_DEPARTMENT_OTHER): Payer: BLUE CROSS/BLUE SHIELD | Admitting: Nurse Practitioner

## 2015-09-16 ENCOUNTER — Encounter: Payer: Self-pay | Admitting: Nurse Practitioner

## 2015-09-16 VITALS — BP 130/83 | HR 73 | Temp 98.1°F | Resp 18 | Ht 62.0 in | Wt 168.0 lb

## 2015-09-16 DIAGNOSIS — C50412 Malignant neoplasm of upper-outer quadrant of left female breast: Secondary | ICD-10-CM | POA: Diagnosis not present

## 2015-09-16 NOTE — Progress Notes (Signed)
CLINIC:  Cancer Survivorship   REASON FOR VISIT:  Routine follow-up post-treatment for a recent history of breast cancer.  BRIEF ONCOLOGIC HISTORY:    Breast cancer of upper-outer quadrant of left female breast ([place])   07/28/2014 Mammogram Left breast: possible mass warranting further imaging   08/05/2014 Breast US Left breast 10 o clock: 2.3 x 2 x 1.4 cm lobular hypoechoeic mass   08/05/2014 Initial Biopsy Left breast core needle bx: Invasive Mammary Carcinoma, grade 3, ER- (0%), PR- (0%), HER2/neu positive (ratio 3.74), Ki 67: 97%   08/12/2014 Procedure Genetic testing: BreastNext panel Cephus Shelling) revealed no clinically significant variant at ATM, BARD1, BRCA1, BRCA2, BRIP1, CDH1, CHEK2, MRE11A, MUTYH, NBN, NF1, PALB2, PTEN, RAD50, RAD51C, RAD51D, and TP53.   08/17/2014 Breast MRI Left breast known malignancy upper inner quadrant 2.6 cm: Suspicious non-mass enhancement upper-inner quadrant right breast biopsy pending   08/17/2014 Clinical Stage Stage IIA: T2 N0   08/27/2014 - 12/17/2014 Neo-Adjuvant Chemotherapy Docetaxel, carboplatin, trastuzumab, and pertuzumab every 3 weeks x 6 cycles   12/27/2014 Breast MRI Significant treatment response. Previously biopsied mass is no longer visible, residual clumped linearly oriented non-mass enhancement anterior to the biopsied mass.   01/07/2015 - 08/23/2015 Chemotherapy Maintenence trastuzumab q 3 weeks to complete one year of therapy   01/18/2015 Definitive Surgery Left lumpectomy/SL[facility]): negative for invasive and insitu cancer - complete pathologic response. 0/4 LN.   01/18/2015 Pathologic Stage ypTx ypN0   03/29/2015 - 05/13/2015 Radiation Therapy Adjuvant RT Pablo Ledger): Left breast 45 Gy over 25 fractions. Left breast boost 16 Gy over 8 fractions    INTERVAL HISTORY:  Ms. Masley presents to the [facility] today for our initial meeting to review her survivorship care plan detailing her treatment course for breast cancer, as well as  monitoring long-term side effects of that treatment, education regarding health maintenance, screening, and overall wellness and health promotion.     Overall, Ms. Chenette reports feeling quite well since completing her trastuzumab therapy last month.  She remains fatigued, overall, but still continues to exercise regularly and work out with a Physiological scientist twice weekly.  She reports a good appetite, although she still has taste changes from the chemotherapy.  She also continues with peripheral neuropathy in her hands and feet, but feels that it is improving. Ms. Hollick reports some difficulty with memory and finding words since chemotherapy. She has no accompanying neurological symptoms.  She has difficulty falling / staying asleep.  She has been experiencing increased anxiety now that she has completed her cancer treatment, but denies panic attacks.  Ms. Dickison has been experiencing an intermittent headache since having her port-a-cath removed last week, but feels that this is related to the anesthesia and says it is also improving. She denies any change in her breast, specifically denying any mass or nodularity.  She reports rare shooting, stinging pain in her left breast.  She denies any cough, shortness of breath, or bone pain. She has had no weight loss.  She is looking forward to spending Thanksgiving with her son in [place] and her trip with her daughter to [place] next spring (April 2017).    REVIEW OF SYSTEMS:  General: Fatigue as above. Denies fever, chills, unintentional weight loss, or night sweats. HEENT: Some visual and taste changes following chemotherapy but denies diplopia, hearing loss, mouth sores, or difficulty swallowing.  Cardiac: Denies palpitations, chest pain, and lower extremity edema.  Respiratory: Denies dyspnea on exertion.  GI: Denies abdominal pain, constipation, diarrhea,  nausea, or vomiting.  GU: Denies dysuria, hematuria, vaginal bleeding, vaginal discharge, or vaginal  dryness.  Musculoskeletal: Denies joint or bone pain.  Neuro: Headache and peripheral neuropathy as above.  Denies recent falls. Skin: Denies rash, pruritis, or open wounds.  Breast: Denies any new nodularity, masses, tenderness, nipple changes, or nipple discharge.  Psych: Memory loss and anxiety as above.  Denies depression.  A 14-point review of systems was completed and was negative, except as noted above.   ONCOLOGY TREATMENT TEAM:  1. Surgeon:  Dr. Barry Dienes at [facility]  2. Medical Oncologist: Dr. Lindi Adie 3. Radiation Oncologist: Dr. Pablo Ledger    PAST MEDICAL/SURGICAL HISTORY:  Past Medical History  Diagnosis Date  . Uterine cyst   . Ovarian cyst   . Irregular menstrual cycle   . Hot flashes   . Breast cancer of upper-outer quadrant of left female breast ([place])   . Wears glasses   . PONV (postoperative nausea and vomiting)     severe  . Breast cancer ([place]) 08/05/14    left breast bx  . Allergy   . GERD (gastroesophageal reflux disease)    Past Surgical History  Procedure Laterality Date  . Uterine ablation  2006  . Cesarean section  1991  . Colonoscopy    . Diagnostic laparoscopy  1990    ovarian cysy  . Upper gi endoscopy    . Dilation and curettage of uterus    . Portacath placement Left 08/18/2014    Procedure: INSERTION PORT-A-CATH;  Surgeon: [name], MD;  Location: [place];  Service: General;  Laterality: Left;  . Radioactive seed guided mastectomy with axillary sentinel lymph node biopsy Left 01/18/2015    Procedure: RADIOACTIVE SEED GUIDED PARTIAL MASTECTOMY WITH AXILLARY SENTINEL LYMPH NODE BIOPSY;  Surgeon: [name], MD;  Location: [place];  Service: General;  Laterality: Left;  . Port-a-cath removal Left 09/14/2015    Procedure: REMOVAL PORT-A-CATH;  Surgeon: [name], MD;  Location: [place];  Service: General;  Laterality: Left;     ALLERGIES:  Allergies  Allergen Reactions   . Codeine Nausea Only  . Sulfa Antibiotics Rash  . Penicillins Rash     CURRENT MEDICATIONS:  Current Outpatient Prescriptions on File Prior to Visit  Medication Sig Dispense Refill  . acetaminophen (TYLENOL) 500 MG tablet Take 500 mg by mouth every 6 (six) hours as needed.    Marland Kitchen buPROPion (WELLBUTRIN SR) 150 MG 12 hr tablet Take 150 mg by mouth every evening.     . fluticasone (FLONASE) 50 MCG/ACT nasal spray Place into both nostrils daily as needed.    Marland Kitchen oxyCODONE-acetaminophen (ROXICET) 5-325 MG tablet Take 1-2 tablets by mouth every 4 (four) hours as needed for severe pain. 10 tablet 0  . simvastatin (ZOCOR) 20 MG tablet Take 20 mg by mouth daily at 6 PM.     . SOOLANTRA 1 % CREA   0   Current Facility-Administered Medications on File Prior to Visit  Medication Dose Route Frequency Provider Last Rate Last Dose  . Influenza vac split quadrivalent PF (FLUARIX) injection 0.5 mL  0.5 mL Intramuscular Tomorrow-1000 Nicholas Lose, MD         ONCOLOGIC FAMILY HISTORY:  Family History  Problem Relation Age of Onset  . Breast cancer Maternal Grandmother     Dx 52s; Deceased 69s  . Breast cancer Paternal Aunt     Dx 31s; Deceased 46s  . Melanoma Paternal Aunt  deceased in teens     GENETIC COUNSELING/TESTING: BreastNext panel ([place]) performed 08/12/2014 revealed no clinically significant variant at ATM, BARD1, BRCA1, BRCA2, BRIP1, CDH1, CHEK2, MRE11A, MUTYH, NBN, NF1, PALB2, PTEN, RAD50, RAD51C, RAD51D, and TP53.  SOCIAL HISTORY:  MARGUERITA STAPP is married and lives with her spouse in [place], [place].  She has 2 children.  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  She denies any current or history of tobacco or illicit drug use.  She has several glasses of alcohol / week.   PHYSICAL EXAMINATION:  Vital Signs:   Filed Vitals:   09/16/15 1009  BP: 130/83  Pulse: 73  Temp: 98.1 F (36.7 C)  Resp: 18   General: Well-nourished, well-appearing  female in no acute distress.  She is unaccompanied in clinic today.   HEENT: Head is atraumatic and normocephalic.  Pupils equal and reactive to light and accomodation. Conjunctivae clear without exudate.  Sclerae anicteric. Oral mucosa is pink, moist, and intact without lesions.  Oropharynx is pink without lesions or erythema.  Lymph: No cervical, supraclavicular, infraclavicular, or axillary lymphadenopathy noted on palpation.  Cardiovascular: Regular rate and rhythm without murmurs, rubs, or gallops. Respiratory: Clear to auscultation bilaterally. Chest expansion symmetric without accessory muscle use on inspiration or expiration.  GI: Abdomen soft and round. No tenderness to palpation. Bowel sounds normoactive in 4 quadrants. GU: Deferred.  Neuro: No focal deficits. Steady gait.  Psych: Mood and affect normal and appropriate for situation.  Extremities: No edema, cyanosis, or clubbing.  Skin: Warm and dry. No open lesions noted.   LABORATORY DATA:  None for this visit.  DIAGNOSTIC IMAGING:  None for this visit.     ASSESSMENT AND PLAN:   1. History of breast cancer: Stage IIA invasive mammary carcinoma of the left breast, ER/PR negative, HER2/neu positive (ratio 3.74), S/P neoadjuvant chemotherapy with docetaxel, carboplatin, trastuzumab, and pertuzumab, with resulting complete pathologic response, S/P lumpectomy / SLNB with no residual malignancy noted, S/P adjuvant radiation therapy to the left breast now followed in a program of surveillance.  Ms. Rahrig doing well with no clinical evidence of disease recurrence. She will follow-up with her medical oncologist,  Dr. Lindi Adie, in May 2017 with history and physical exam per surveillance protocol.   A comprehensive survivorship care plan and treatment summary was reviewed with the patient today detailing her breast cancer diagnosis, treatment course, potential late/long-term effects of treatment, appropriate follow-up care with recommendations  for the future, and patient education resources.  A copy of this summary, along with a letter will be sent to the patient's primary care provider via in basket message after today's visit.  Ms. Mccance is welcome to return to the Survivorship Clinic in the future, as needed; no follow-up will be scheduled at this time.    2. Cancer screening:  Due to Ms. Hacker's history and her age, she should receive screening for skin cancers, colon cancer, and gynecologic cancers.  Ms. Basque has had a history of abnormal cells on prior Pap smear. She will continue be followed closely by Dr. Raphael Gibney. She reports her most recent Pap smear improved. He is up-to-date on her colonoscopy. The information and recommendations are listed on the patient's comprehensive care plan/treatment summary and were reviewed in detail with the patient.    3. Health maintenance and wellness promotion: Ms. Helbig was encouraged to consume 5-7 servings of fruits and vegetables per day. We reviewed the "Nutrition Rainbow" handout, as well recommendations for maximizing  nutrition including limiting red meats and processed foods. She was also encouraged to continue to engage in moderate to vigorous exercise for 30 minutes per day most days of the week. She continues to work with a Physiological scientist 2x/weekly (and wears her compression sleeve when doing so).  She was instructed to limit her alcohol consumption and continue to abstain from tobacco use.   4. Support services/counseling: It is not uncommon for this period of the patient's cancer care trajectory to be one of many emotions and stressors and I have reinforced to Ms. Whitcomb that her feelings are quite expected at this time. She has an upcoming appointment to see her counselor, Loye, and I have asked her to report if this (and subsequent) visits do not help with her anxiety.  We discussed an opportunity for her to participate in the next session of [facility] ("Finding Your New Normal") support group  series designed for patients after they have completed treatment.   Ms. Janowicz was encouraged to take advantage of our many other support services programs, support groups, and/or counseling in coping with her new life as a cancer survivor after completing anti-cancer treatment.  She was offered support today through active listening and expressive supportive counseling.  She was given information regarding our available services and encouraged to contact me with any questions or for help enrolling in any of our support group/programs.    A total of 50 minutes of face-to-face time was spent with this patient with greater than 50% of that time in counseling and care-coordination.   Sylvan Cheese, NP  Survivorship Program [facility] [phone]   Note: PRIMARY CARE PROVIDER [name], [place] [phone]

## 2015-10-25 ENCOUNTER — Other Ambulatory Visit: Payer: Self-pay | Admitting: General Surgery

## 2015-10-25 DIAGNOSIS — C50412 Malignant neoplasm of upper-outer quadrant of left female breast: Secondary | ICD-10-CM

## 2016-02-25 IMAGING — CT CT ANGIO CHEST
2 of 8 series · 18 of 46 positions shown · IV contrast (Omni 300)
Comparison: None.

CLINICAL DATA: Left-sided chest pain and arm swelling. Left upper
outer quadrant breast carcinoma.

EXAM:
CT ANGIOGRAPHY CHEST WITH CONTRAST
TECHNIQUE: Multidetector CT imaging of the chest was performed using the
standard protocol during bolus administration of intravenous
contrast. Multiplanar CT image reconstructions and MIPs were
obtained to evaluate the vascular anatomy.
CONTRAST:  100mL OMNIPAQUE IOHEXOL 350 MG/ML SOLN

[Series 5: thins · axial · 0.59mm/px · z∈[-545,-331]mm · 15 of 236 slices shown]
[im 11/236  lung]
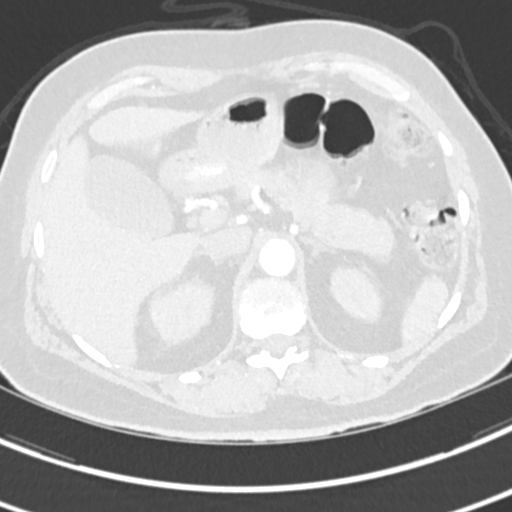
[im 33/236  soft-tissue]
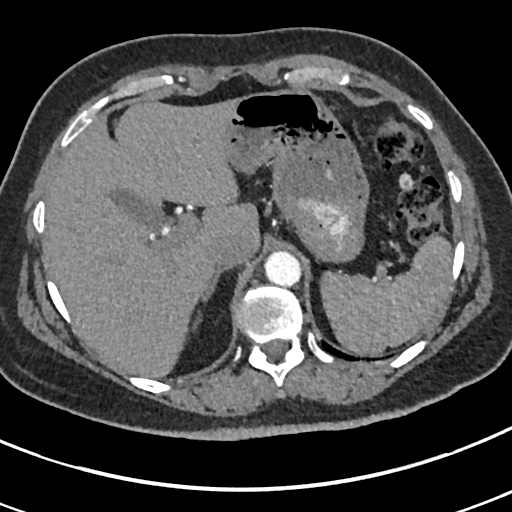
[im 43/236  lung]
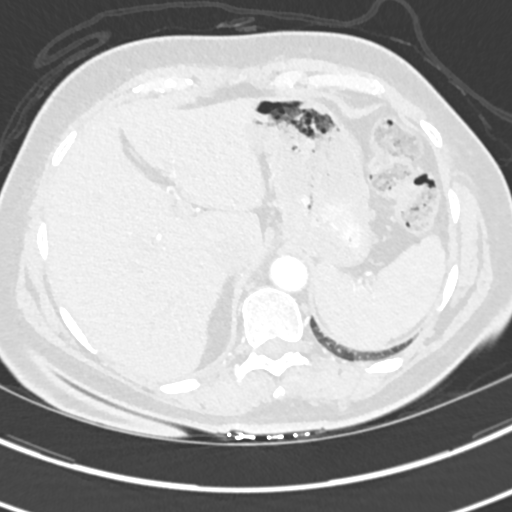
[im 54/236  soft-tissue]
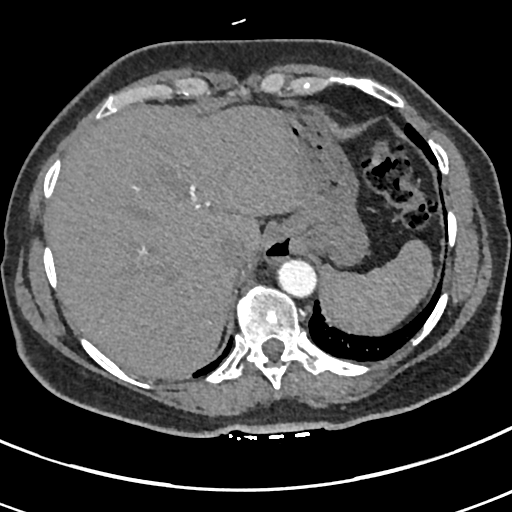
[im 75/236  lung]
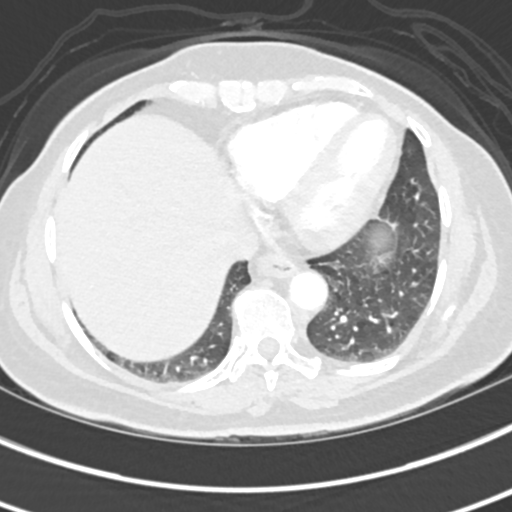
[im 86/236  soft-tissue]
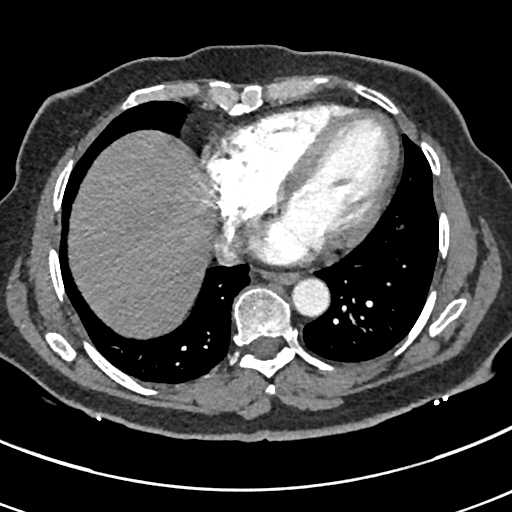
[im 107/236  lung]
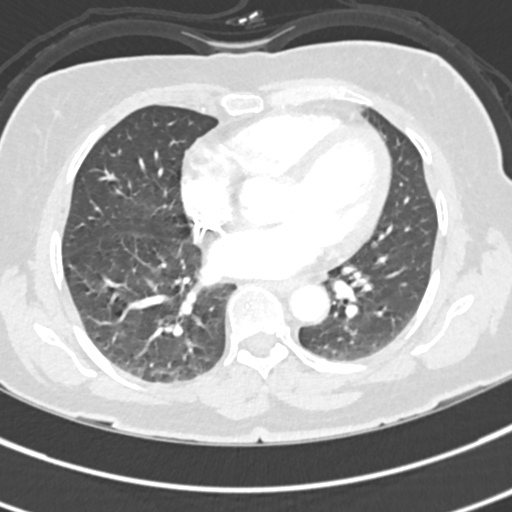
[im 118/236  soft-tissue]
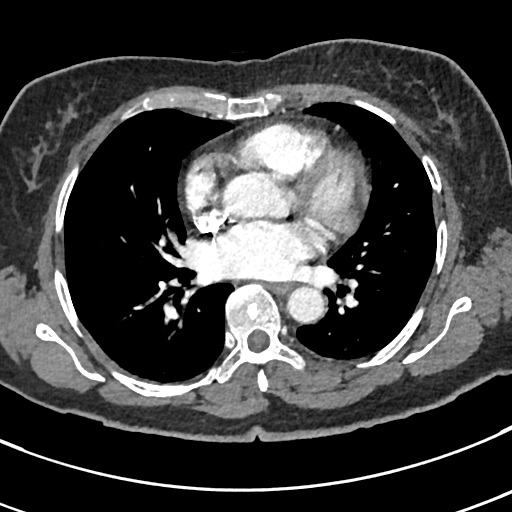
[im 129/236  lung]
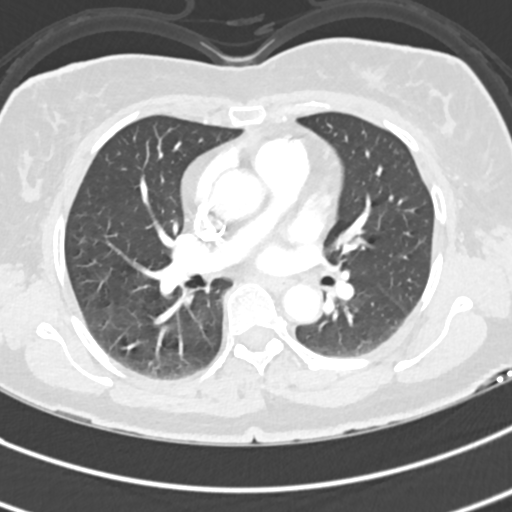
[im 150/236  soft-tissue]
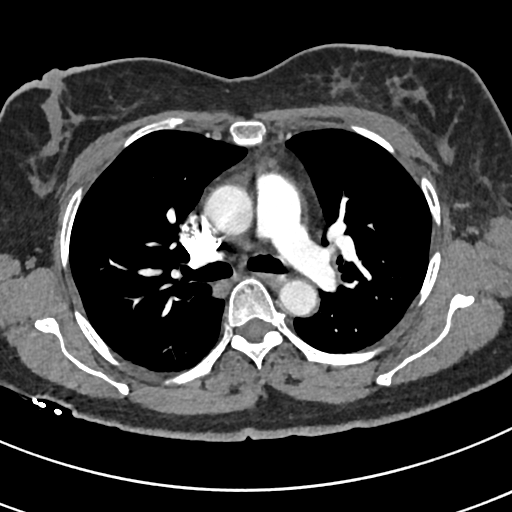
[im 161/236  lung]
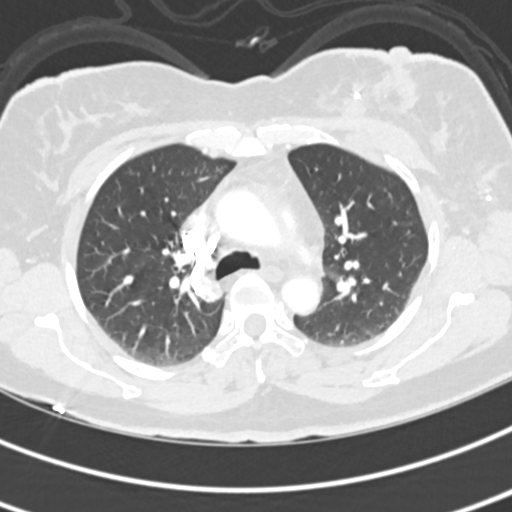
[im 182/236  soft-tissue]
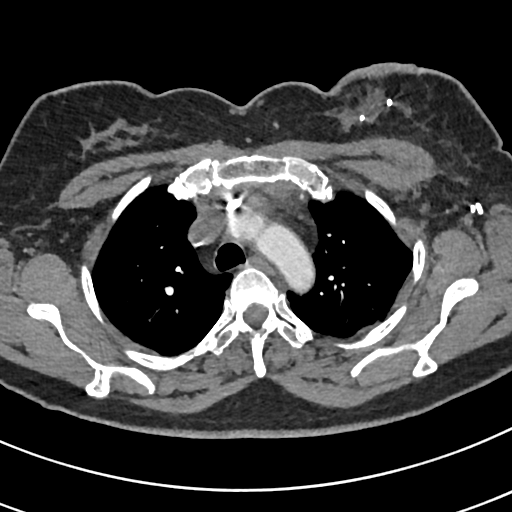
[im 193/236  lung]
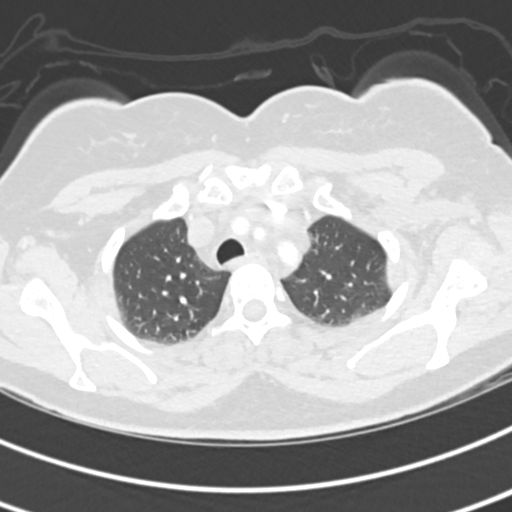
[im 203/236  soft-tissue]
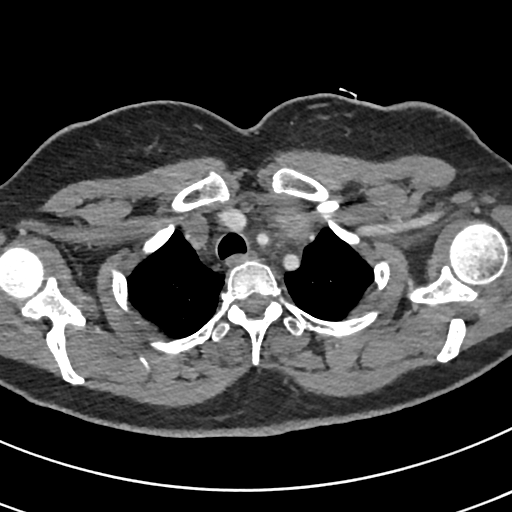
[im 225/236  lung]
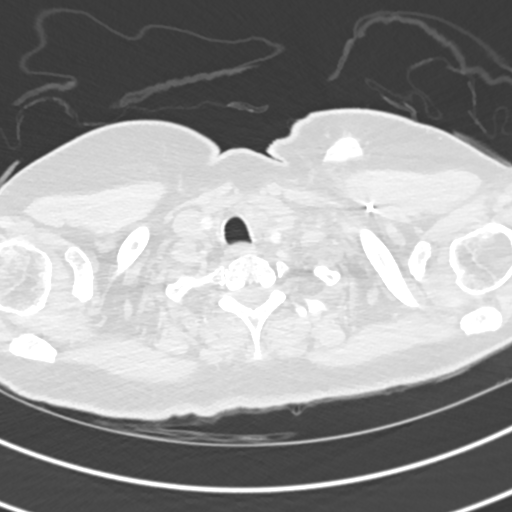

[Series 7: coronal mpr · coronal · 0.46mm/px · 3 of 111 slices shown]
[im 28/111  soft-tissue]
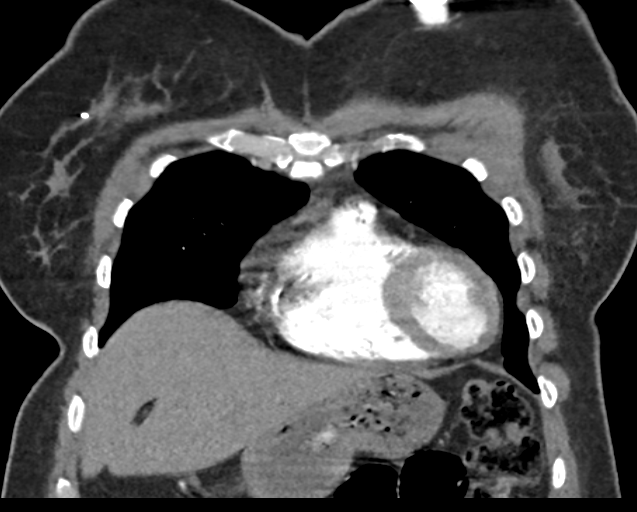
[im 56/111  soft-tissue]
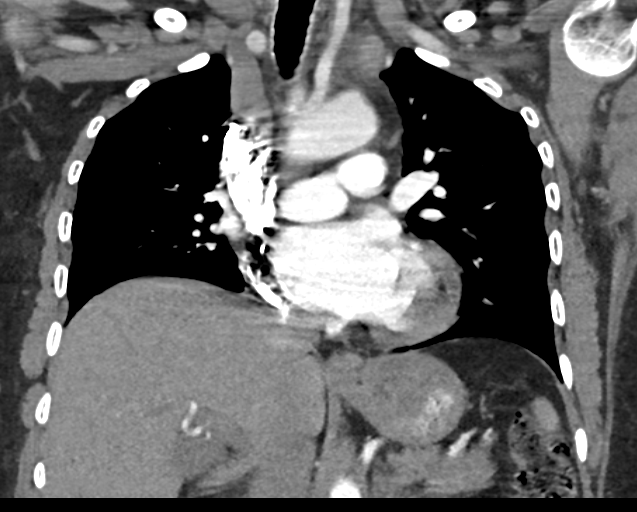
[im 83/111  soft-tissue]
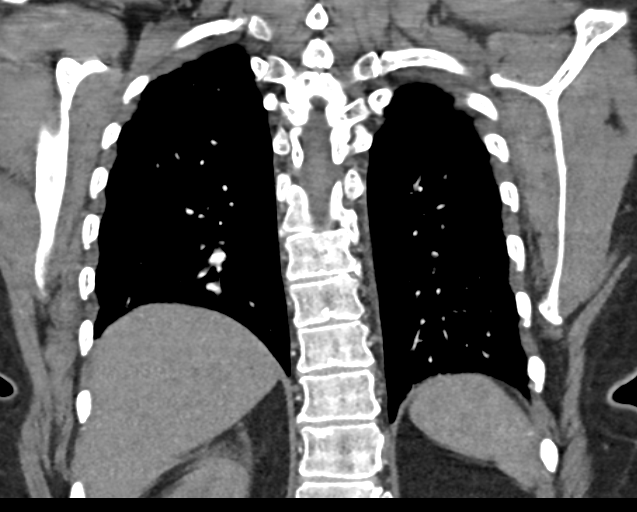

[18 of 46 positions shown; findings below may reference images not displayed]

FINDINGS: Mediastinum/Lymph Nodes: Satisfactory opacification of pulmonary
arteries is noted, and no pulmonary embolism identified. No evidence
of thoracic aortic dissection or aneurysm. Mild cardiomegaly noted.

No masses or pathologically enlarged lymph nodes identified.
Surgical clips noted in the left breast and axilla.

Lungs/Pleura: No pulmonary infiltrate or mass identified. No
effusion present. Tiny right lung calcified granuloma and fissural
lymph node incidentally noted.

Musculoskeletal/Soft Tissues: No suspicious bone lesions or other
significant chest wall abnormality.

Upper Abdomen:  Small hiatal hernia incidentally noted.

Review of the MIP images confirms the above findings.
IMPRESSION: No evidence of pulmonary embolism or other acute findings.

Small hiatal hernia.

## 2016-03-05 ENCOUNTER — Other Ambulatory Visit: Payer: Self-pay | Admitting: Hematology and Oncology

## 2016-03-05 ENCOUNTER — Encounter: Payer: Self-pay | Admitting: Hematology and Oncology

## 2016-03-05 ENCOUNTER — Telehealth: Payer: Self-pay | Admitting: Hematology and Oncology

## 2016-03-05 ENCOUNTER — Ambulatory Visit (HOSPITAL_BASED_OUTPATIENT_CLINIC_OR_DEPARTMENT_OTHER): Payer: BLUE CROSS/BLUE SHIELD | Admitting: Hematology and Oncology

## 2016-03-05 DIAGNOSIS — C50412 Malignant neoplasm of upper-outer quadrant of left female breast: Secondary | ICD-10-CM

## 2016-03-05 NOTE — Telephone Encounter (Signed)
Gave and printed appt sched and avs for pt for May and NOV.... °

## 2016-03-05 NOTE — Progress Notes (Signed)
Patient Care Team: Seward Carol, MD as PCP - General (Internal Medicine) [name], MD as Consulting Physician (Surgical Oncology) Thea Silversmith, MD as Consulting Physician (Radiation Oncology) Nicholas Lose, MD as Consulting Physician (Hematology and Oncology) Sylvan Cheese, NP as Nurse Practitioner (Hematology and Oncology) Ena Dawley, MD as Consulting Physician (Obstetrics and Gynecology)  DIAGNOSIS: Breast cancer of upper-outer quadrant of left female breast [facility])   Staging form: Breast, AJCC 7th Edition     Clinical: Stage IIA (T2, N0, cM0) - Unsigned       Staging comments: Staged at breast conference 08/11/14.      Pathologic stage from 01/20/2015: Stage Unknown (yTX, N0, cM0) - Signed by Enid Cutter, MD on 01/25/2015       Staging comments: Staged on final lumpectomy specimen by Dr. Donato Heinz  SUMMARY OF ONCOLOGIC HISTORY:   Breast cancer of upper-outer quadrant of left female breast ([place])   07/28/2014 Mammogram Left breast: possible mass warranting further imaging   08/05/2014 Breast US Left breast 10 o clock: 2.3 x 2 x 1.4 cm lobular hypoechoeic mass   08/05/2014 Initial Biopsy Left breast core needle bx: Invasive Mammary Carcinoma, grade 3, ER- (0%), PR- (0%), HER2/neu positive (ratio 3.74), Ki 67: 97%   08/12/2014 Procedure Genetic testing: BreastNext panel Cephus Shelling) revealed no clinically significant variant at ATM, BARD1, BRCA1, BRCA2, BRIP1, CDH1, CHEK2, MRE11A, MUTYH, NBN, NF1, PALB2, PTEN, RAD50, RAD51C, RAD51D, and TP53.   08/17/2014 Breast MRI Left breast known malignancy upper inner quadrant 2.6 cm: Suspicious non-mass enhancement upper-inner quadrant right breast biopsy pending   08/17/2014 Clinical Stage Stage IIA: T2 N0   08/27/2014 - 12/17/2014 Neo-Adjuvant Chemotherapy Docetaxel, carboplatin, trastuzumab, and pertuzumab every 3 weeks x 6 cycles   12/27/2014 Breast MRI Significant treatment response. Previously biopsied mass is no longer visible, residual  clumped linearly oriented non-mass enhancement anterior to the biopsied mass.   01/07/2015 - 08/23/2015 Chemotherapy Maintenence trastuzumab q 3 weeks to complete one year of therapy   01/18/2015 Definitive Surgery Left lumpectomy/[facility]): negative for invasive and insitu cancer - complete pathologic response. 0/4 LN.   01/18/2015 Pathologic Stage ypTx ypN0   03/29/2015 - 05/13/2015 Radiation Therapy Adjuvant RT Pablo Ledger): Left breast 45 Gy over 25 fractions. Left breast boost 16 Gy over 8 fractions   09/16/2015 Survivorship Survivorship visit completed and copy of care plan provided to patient    CHIEF COMPLIANT: surveillance of breast cancer  INTERVAL HISTORY: Julie [name] is a 60 year old with above-mentioned history of left breast cancer very complete pathologic response to neoadjuvant chemotherapy and then subsequently finished adjuvant radiation and adjuvant Herceptin maintenance therapy. She is here for a six-month follow-up. She reports no new problems or concerns with her breasts. She denies any lumps or nodules. She has been enjoying her life. She has been gardening and went on a gardening of [place].  REVIEW OF SYSTEMS:   Constitutional: Denies fevers, chills or abnormal weight loss Eyes: Denies blurriness of vision Ears, nose, mouth, throat, and face: Denies mucositis or sore throat Respiratory: Denies cough, dyspnea or wheezes Cardiovascular: Denies palpitation, chest discomfort Gastrointestinal:  Denies nausea, heartburn or change in bowel habits Skin: Denies abnormal skin rashes Lymphatics: Denies new lymphadenopathy or easy bruising Neurological:Denies numbness, tingling or new weaknesses Behavioral/Psych: Mood is stable, no new changes  Extremities: No lower extremity edema Breast:  denies any pain or lumps or nodules in either breasts All other systems were reviewed with the patient and are negative.  I have reviewed the  past medical history, past surgical history,  social history and family history with the patient and they are unchanged from previous note.  ALLERGIES:  is allergic to codeine; sulfa antibiotics; and penicillins.  MEDICATIONS:  Current Outpatient Prescriptions  Medication Sig Dispense Refill  . acetaminophen (TYLENOL) 500 MG tablet Take 500 mg by mouth every 6 (six) hours as needed.    Marland Kitchen buPROPion (WELLBUTRIN SR) 150 MG 12 hr tablet Take 150 mg by mouth every evening.     . fluticasone (FLONASE) 50 MCG/ACT nasal spray Place into both nostrils daily as needed.    Marland Kitchen oxyCODONE-acetaminophen (ROXICET) 5-325 MG tablet Take 1-2 tablets by mouth every 4 (four) hours as needed for severe pain. 10 tablet 0  . simvastatin (ZOCOR) 20 MG tablet Take 20 mg by mouth daily at 6 PM.     . SOOLANTRA 1 % CREA   0   No current facility-administered medications for this visit.   Facility-Administered Medications Ordered in Other Visits  Medication Dose Route Frequency Provider Last Rate Last Dose  . Influenza vac split quadrivalent PF (FLUARIX) injection 0.5 mL  0.5 mL Intramuscular Tomorrow-1000 Nicholas Lose, MD        PHYSICAL EXAMINATION: ECOG PERFORMANCE STATUS: 1 - Symptomatic but completely ambulatory  Filed Vitals:   03/05/16 1412  BP: 126/84  Pulse: 79  Temp: 98.3 F (36.8 C)  Resp: 18   Filed Weights   03/05/16 1412  Weight: 173 lb 14.4 oz (78.881 kg)    GENERAL:alert, no distress and comfortable SKIN: skin color, texture, turgor are normal, no rashes or significant lesions EYES: normal, Conjunctiva are pink and non-injected, sclera clear OROPHARYNX:no exudate, no erythema and lips, buccal mucosa, and tongue normal  NECK: supple, thyroid normal size, non-tender, without nodularity LYMPH:  no palpable lymphadenopathy in the cervical, axillary or inguinal LUNGS: clear to auscultation and percussion with normal breathing effort HEART: regular rate & rhythm and no murmurs and no lower extremity edema ABDOMEN:abdomen soft,  non-tender and normal bowel sounds MUSCULOSKELETAL:no cyanosis of digits and no clubbing  NEURO: alert & oriented x 3 with fluent speech, no focal motor/sensory deficits EXTREMITIES: No lower extremity edema  LABORATORY DATA:  I have reviewed the data as listed   Chemistry      Component Value Date/Time   NA 142 08/23/2015 0853   NA 140 05/04/2015 1245   K 3.7 08/23/2015 0853   K 3.9 05/04/2015 1245   CL 105 05/04/2015 1245   CO2 28 08/23/2015 0853   CO2 30 05/04/2015 1245   BUN 17.3 08/23/2015 0853   BUN 12 05/04/2015 1245   CREATININE 1.0 08/23/2015 0853   CREATININE 0.82 05/04/2015 1245      Component Value Date/Time   CALCIUM 9.6 08/23/2015 0853   CALCIUM 9.5 05/04/2015 1245   ALKPHOS 96 08/23/2015 0853   ALKPHOS 97 05/04/2015 1245   AST 18 08/23/2015 0853   AST 19 05/04/2015 1245   ALT 19 08/23/2015 0853   ALT 19 05/04/2015 1245   BILITOT 0.42 08/23/2015 0853   BILITOT 0.5 05/04/2015 1245       Lab Results  Component Value Date   WBC 3.8* 08/23/2015   HGB 12.7 08/23/2015   HCT 37.8 08/23/2015   MCV 93.5 08/23/2015   PLT 201 08/23/2015   NEUTROABS 2.4 08/23/2015     ASSESSMENT & PLAN:  Breast cancer of upper-outer quadrant of left female breast Left breast invasive ductal carcinoma grade 3 ER 0% PR 0% HER-2 positive  ratio 3.74, Ki-67 90%: 2.3 cm mass by ultrasound clinical stage: T2, N0, M0 stage II A. Status post left lumpectomy 01/16/2015: Negative for in situ or invasive breast cancer 0/4 lymph nodes, complete pathologic response  Chemotherapy summary: Completed 6 cycles of neoadjuvant chemotherapy with Taxotere, carboplatin, Herceptin and Perjeta every 3 weeks started 08/28/15. Herceptin maintenance therapy completed 08/23/2015 Completed adjuvant radiation 05/13/2015  Breast Cancer Surveillance: 1. Breast exam every 6 months 2. Mammogram will be scheduled for this month.  Return to clinic in 6 months after that we can see her once a year.        Orders Placed This Encounter  Procedures  . MM DIAG BREAST TOMO BILATERAL    Standing Status: Future     Number of Occurrences:      Standing Expiration Date: 05/05/2017    Order Specific Question:  Reason for Exam (SYMPTOM  OR DIAGNOSIS REQUIRED)    Answer:  annual diagnostic mammogram    Order Specific Question:  Is the patient pregnant?    Answer:  No    Order Specific Question:  Preferred imaging location?    Answer:  [facility]   The patient has a good understanding of the overall plan. she agrees with it. she will call with any problems that may develop before the next visit here.   Rulon Eisenmenger, MD 03/05/2016

## 2016-03-05 NOTE — Assessment & Plan Note (Signed)
Left breast invasive ductal carcinoma grade 3 ER 0% PR 0% HER-2 positive ratio 3.74, Ki-67 90%: 2.3 cm mass by ultrasound clinical stage: T2, N0, M0 stage II A. Status post left lumpectomy 01/16/2015: Negative for in situ or invasive breast cancer 0/4 lymph nodes, complete pathologic response  Chemotherapy summary: Completed 6 cycles of neoadjuvant chemotherapy with Taxotere, carboplatin, Herceptin and Perjeta every 3 weeks started 08/28/15. Herceptin maintenance therapy completed 08/23/2015 Completed adjuvant radiation 05/13/2015  Breast Cancer Surveillance: 1. Breast exam every 6 months 2. Mammogram will be scheduled for this month.  Return to clinic in 6 months after that we can see her once a year.

## 2016-03-06 ENCOUNTER — Ambulatory Visit: Payer: BLUE CROSS/BLUE SHIELD | Admitting: Hematology and Oncology

## 2016-03-14 ENCOUNTER — Other Ambulatory Visit: Payer: Self-pay | Admitting: Hematology and Oncology

## 2016-03-14 ENCOUNTER — Ambulatory Visit
Admission: RE | Admit: 2016-03-14 | Discharge: 2016-03-14 | Disposition: A | Discharge status: Home / Self Care | Payer: BLUE CROSS/BLUE SHIELD | Source: Ambulatory Visit | Attending: Hematology and Oncology | Admitting: Hematology and Oncology

## 2016-03-14 DIAGNOSIS — C50412 Malignant neoplasm of upper-outer quadrant of left female breast: Secondary | ICD-10-CM

## 2016-03-14 DIAGNOSIS — N63 Unspecified lump in breast: Secondary | ICD-10-CM | POA: Diagnosis not present

## 2016-03-14 DIAGNOSIS — N6320 Unspecified lump in the left breast, unspecified quadrant: Secondary | ICD-10-CM

## 2016-04-17 DIAGNOSIS — L237 Allergic contact dermatitis due to plants, except food: Secondary | ICD-10-CM | POA: Diagnosis not present

## 2016-04-17 DIAGNOSIS — L570 Actinic keratosis: Secondary | ICD-10-CM | POA: Diagnosis not present

## 2016-04-19 DIAGNOSIS — N898 Other specified noninflammatory disorders of vagina: Secondary | ICD-10-CM | POA: Diagnosis not present

## 2016-04-20 DIAGNOSIS — L237 Allergic contact dermatitis due to plants, except food: Secondary | ICD-10-CM | POA: Diagnosis not present

## 2016-05-08 DIAGNOSIS — C50212 Malignant neoplasm of upper-inner quadrant of left female breast: Secondary | ICD-10-CM | POA: Diagnosis not present

## 2016-05-28 DIAGNOSIS — Z01411 Encounter for gynecological examination (general) (routine) with abnormal findings: Secondary | ICD-10-CM | POA: Diagnosis not present

## 2016-05-28 DIAGNOSIS — N760 Acute vaginitis: Secondary | ICD-10-CM | POA: Diagnosis not present

## 2016-05-28 DIAGNOSIS — B373 Candidiasis of vulva and vagina: Secondary | ICD-10-CM | POA: Diagnosis not present

## 2016-09-05 ENCOUNTER — Ambulatory Visit: Payer: BLUE CROSS/BLUE SHIELD | Admitting: Hematology and Oncology

## 2016-09-24 DIAGNOSIS — D1801 Hemangioma of skin and subcutaneous tissue: Secondary | ICD-10-CM | POA: Diagnosis not present

## 2016-09-24 DIAGNOSIS — L719 Rosacea, unspecified: Secondary | ICD-10-CM | POA: Diagnosis not present

## 2016-09-24 DIAGNOSIS — L821 Other seborrheic keratosis: Secondary | ICD-10-CM | POA: Diagnosis not present

## 2016-09-24 DIAGNOSIS — L814 Other melanin hyperpigmentation: Secondary | ICD-10-CM | POA: Diagnosis not present

## 2016-09-26 NOTE — Assessment & Plan Note (Signed)
Left breast invasive ductal carcinoma grade 3 ER 0% PR 0% HER-2 positive ratio 3.74, Ki-67 90%: 2.3 cm mass by ultrasound clinical stage: T2, N0, M0 stage II A. Status post left lumpectomy 01/16/2015: Negative for in situ or invasive breast cancer 0/4 lymph nodes, complete pathologic response  Chemotherapy summary: Completed 6 cycles of neoadjuvant chemotherapy with Taxotere, carboplatin, Herceptin and Perjeta every 3 weeks started 08/28/15. Herceptin maintenance therapy completed 08/23/2015 Completed adjuvant radiation 05/13/2015  Breast Cancer Surveillance: 1. Breast exam every 6 months 2. Mammogram 03/14/16: Benign  Return to clinic in one year.

## 2016-09-27 ENCOUNTER — Encounter: Payer: Self-pay | Admitting: Hematology and Oncology

## 2016-09-27 ENCOUNTER — Ambulatory Visit (HOSPITAL_BASED_OUTPATIENT_CLINIC_OR_DEPARTMENT_OTHER): Payer: BLUE CROSS/BLUE SHIELD | Admitting: Hematology and Oncology

## 2016-09-27 DIAGNOSIS — Z23 Encounter for immunization: Secondary | ICD-10-CM

## 2016-09-27 DIAGNOSIS — Z171 Estrogen receptor negative status [ER-]: Secondary | ICD-10-CM | POA: Diagnosis not present

## 2016-09-27 DIAGNOSIS — C50412 Malignant neoplasm of upper-outer quadrant of left female breast: Secondary | ICD-10-CM

## 2016-09-27 MED ORDER — INFLUENZA VAC SPLIT QUAD 0.5 ML IM SUSY
0.5000 mL | PREFILLED_SYRINGE | Freq: Once | INTRAMUSCULAR | Status: AC
  Administered 2016-09-27: 0.5 mL via INTRAMUSCULAR
  Filled 2016-09-27: qty 0.5

## 2016-09-27 NOTE — Progress Notes (Signed)
Patient Care Team: Seward Carol, MD as PCP - General (Internal Medicine) [name], MD as Consulting Physician (Surgical Oncology) Thea Silversmith, MD as Consulting Physician (Radiation Oncology) Nicholas Lose, MD as Consulting Physician (Hematology and Oncology) Sylvan Cheese, NP as Nurse Practitioner (Hematology and Oncology) Ena Dawley, MD as Consulting Physician (Obstetrics and Gynecology)  DIAGNOSIS:  Encounter Diagnosis  Name Primary?  . Malignant neoplasm of upper-outer quadrant of left breast in female, estrogen receptor negative ([place])     SUMMARY OF ONCOLOGIC HISTORY:   Breast cancer of upper-outer quadrant of left female breast ([place])   07/28/2014 Mammogram    Left breast: possible mass warranting further imaging      08/05/2014 Breast US    Left breast 10 o clock: 2.3 x 2 x 1.4 cm lobular hypoechoeic mass      08/05/2014 Initial Biopsy    Left breast core needle bx: Invasive Mammary Carcinoma, grade 3, ER- (0%), PR- (0%), HER2/neu positive (ratio 3.74), Ki 67: 97%      08/12/2014 Procedure    Genetic testing: BreastNext panel Cephus Shelling) revealed no clinically significant variant at ATM, BARD1, BRCA1, BRCA2, BRIP1, CDH1, CHEK2, MRE11A, MUTYH, NBN, NF1, PALB2, PTEN, RAD50, RAD51C, RAD51D, and TP53.      08/17/2014 Breast MRI    Left breast known malignancy upper inner quadrant 2.6 cm: Suspicious non-mass enhancement upper-inner quadrant right breast biopsy pending      08/17/2014 Clinical Stage    Stage IIA: T2 N0      08/27/2014 - 12/17/2014 Neo-Adjuvant Chemotherapy    Docetaxel, carboplatin, trastuzumab, and pertuzumab every 3 weeks x 6 cycles      12/27/2014 Breast MRI    Significant treatment response. Previously biopsied mass is no longer visible, residual clumped linearly oriented non-mass enhancement anterior to the biopsied mass.      01/07/2015 - 08/23/2015 Chemotherapy    Maintenence trastuzumab q 3 weeks to complete one year of  therapy      01/18/2015 Definitive Surgery    Left lumpectomy/[facility]): negative for invasive and insitu cancer - complete pathologic response. 0/4 LN.      01/18/2015 Pathologic Stage    ypTx ypN0      03/29/2015 - 05/13/2015 Radiation Therapy    Adjuvant RT Pablo Ledger): Left breast 45 Gy over 25 fractions. Left breast boost 16 Gy over 8 fractions      09/16/2015 Survivorship    Survivorship visit completed and copy of care plan provided to patient       CHIEF COMPLIANT:   INTERVAL HISTORY: Julie [name] is a 60 year old with above-mentioned history left breast cancer treated with lumpectomy adjuvant chemotherapy and radiation. She completed one year maintenance Herceptin. She is here for a follow-up. She reports improved energy levels and strength. She denies any nausea vomiting. She is eating better and her taste has recovered. Recently she had a fall and hurt her left knee and she bruised all over the left lower leg.  REVIEW OF SYSTEMS:   Constitutional: Denies fevers, chills or abnormal weight loss Eyes: Denies blurriness of vision Ears, nose, mouth, throat, and face: Denies mucositis or sore throat Respiratory: Denies cough, dyspnea or wheezes Cardiovascular: Denies palpitation, chest discomfort Gastrointestinal:  Denies nausea, heartburn or change in bowel habits Skin: Denies abnormal skin rashes Lymphatics: Denies new lymphadenopathy or easy bruising Neurological:Denies numbness, tingling or new weaknesses Behavioral/Psych: Mood is stable, no new changes  Extremities: No lower extremity edema, left knee bruise  Breast:  denies any pain  or lumps or nodules in either breasts All other systems were reviewed with the patient and are negative.  I have reviewed the past medical history, past surgical history, social history and family history with the patient and they are unchanged from previous note.  ALLERGIES:  is allergic to codeine; sulfa antibiotics; and  penicillins.  MEDICATIONS:  Current Outpatient Prescriptions  Medication Sig Dispense Refill  . acetaminophen (TYLENOL) 500 MG tablet Take 500 mg by mouth every 6 (six) hours as needed.    Marland Kitchen buPROPion (WELLBUTRIN SR) 150 MG 12 hr tablet Take 150 mg by mouth every evening.     . fluticasone (FLONASE) 50 MCG/ACT nasal spray Place into both nostrils daily as needed.    . SOOLANTRA 1 % CREA   0   Current Facility-Administered Medications  Medication Dose Route Frequency Provider Last Rate Last Dose  . Influenza vac split quadrivalent PF (FLUARIX) injection 0.5 mL  0.5 mL Intramuscular Once Nicholas Lose, MD       Facility-Administered Medications Ordered in Other Visits  Medication Dose Route Frequency Provider Last Rate Last Dose  . Influenza vac split quadrivalent PF (FLUARIX) injection 0.5 mL  0.5 mL Intramuscular Tomorrow-1000 Nicholas Lose, MD        PHYSICAL EXAMINATION: ECOG PERFORMANCE STATUS: 1 - Symptomatic but completely ambulatory  Vitals:   09/27/16 1124  BP: 137/75  Pulse: 79  Resp: 18  Temp: 98.1 F (36.7 C)   Filed Weights   09/27/16 1124  Weight: 186 lb 3.2 oz (84.5 kg)    GENERAL:alert, no distress and comfortable SKIN: skin color, texture, turgor are normal, no rashes or significant lesions EYES: normal, Conjunctiva are pink and non-injected, sclera clear OROPHARYNX:no exudate, no erythema and lips, buccal mucosa, and tongue normal  NECK: supple, thyroid normal size, non-tender, without nodularity LYMPH:  no palpable lymphadenopathy in the cervical, axillary or inguinal LUNGS: clear to auscultation and percussion with normal breathing effort HEART: regular rate & rhythm and no murmurs and no lower extremity edema ABDOMEN:abdomen soft, non-tender and normal bowel sounds MUSCULOSKELETAL:no cyanosis of digits and no clubbing  NEURO: alert & oriented x 3 with fluent speech, no focal motor/sensory deficits EXTREMITIES: No lower extremity edema BREAST: No  palpable masses or nodules in either right or left breasts. No palpable axillary supraclavicular or infraclavicular adenopathy no breast tenderness or nipple discharge. (exam performed in the presence of a chaperone)  LABORATORY DATA:  I have reviewed the data as listed   Chemistry      Component Value Date/Time   NA 142 08/23/2015 0853   K 3.7 08/23/2015 0853   CL 105 05/04/2015 1245   CO2 28 08/23/2015 0853   BUN 17.3 08/23/2015 0853   CREATININE 1.0 08/23/2015 0853      Component Value Date/Time   CALCIUM 9.6 08/23/2015 0853   ALKPHOS 96 08/23/2015 0853   AST 18 08/23/2015 0853   ALT 19 08/23/2015 0853   BILITOT 0.42 08/23/2015 0853       Lab Results  Component Value Date   WBC 3.8 (L) 08/23/2015   HGB 12.7 08/23/2015   HCT 37.8 08/23/2015   MCV 93.5 08/23/2015   PLT 201 08/23/2015   NEUTROABS 2.4 08/23/2015    ASSESSMENT & PLAN:  Breast cancer of upper-outer quadrant of left female breast Left breast invasive ductal carcinoma grade 3 ER 0% PR 0% HER-2 positive ratio 3.74, Ki-67 90%: 2.3 cm mass by ultrasound clinical stage: T2, N0, M0 stage II A. Status post  left lumpectomy 01/16/2015: Negative for in situ or invasive breast cancer 0/4 lymph nodes, complete pathologic response  Chemotherapy summary: Completed 6 cycles of neoadjuvant chemotherapy with Taxotere, carboplatin, Herceptin and Perjeta every 3 weeks started 08/28/15. Herceptin maintenance therapy completed 08/23/2015 Completed adjuvant radiation 05/13/2015  Breast Cancer Surveillance: 1. Breast exam 09/27/2016: No palpable lumps or nodules 2. Mammogram 03/14/16: Benign  Return to clinic in one year.   No orders of the defined types were placed in this encounter.  The patient has a good understanding of the overall plan. she agrees with it. she will call with any problems that may develop before the next visit here.   Rulon Eisenmenger, MD 09/27/16

## 2016-09-27 NOTE — Progress Notes (Signed)
Pt requested to receive flu vaccine today. She had forgotten to let Dr.Gudena know. Pt got her flu shot R deltoid. Reviewed possible s/e and to call if pt has any issues.Pt verbalized understanding.

## 2016-10-02 DIAGNOSIS — Z01411 Encounter for gynecological examination (general) (routine) with abnormal findings: Secondary | ICD-10-CM | POA: Diagnosis not present

## 2016-10-02 DIAGNOSIS — N760 Acute vaginitis: Secondary | ICD-10-CM | POA: Diagnosis not present

## 2016-10-02 DIAGNOSIS — Z124 Encounter for screening for malignant neoplasm of cervix: Secondary | ICD-10-CM | POA: Diagnosis not present

## 2016-10-02 DIAGNOSIS — Z6832 Body mass index (BMI) 32.0-32.9, adult: Secondary | ICD-10-CM | POA: Diagnosis not present

## 2016-10-15 ENCOUNTER — Telehealth: Payer: Self-pay | Admitting: *Deleted

## 2016-10-15 NOTE — Telephone Encounter (Signed)
Called office of Mirna Mires, DDS,P,A [phone] left voice message per Dr. Lisbeth Renshaw  That xrays are fine for this pataient 9:39 AM

## 2016-10-16 ENCOUNTER — Encounter: Payer: Self-pay | Admitting: Radiation Oncology

## 2016-10-16 ENCOUNTER — Encounter: Payer: Self-pay | Admitting: *Deleted

## 2016-10-16 ENCOUNTER — Telehealth: Payer: Self-pay | Admitting: *Deleted

## 2016-10-16 NOTE — Telephone Encounter (Signed)
faxed letter okay for xrays to Mirna Mires DDS PA office  As requested 10:57 AM

## 2016-11-05 ENCOUNTER — Other Ambulatory Visit: Payer: Self-pay | Admitting: Nurse Practitioner

## 2017-01-23 DIAGNOSIS — Z0000 Encounter for general adult medical examination without abnormal findings: Secondary | ICD-10-CM | POA: Diagnosis not present

## 2017-03-11 ENCOUNTER — Other Ambulatory Visit: Payer: Self-pay | Admitting: Obstetrics and Gynecology

## 2017-03-11 DIAGNOSIS — Z1231 Encounter for screening mammogram for malignant neoplasm of breast: Secondary | ICD-10-CM

## 2017-03-11 DIAGNOSIS — Z9889 Other specified postprocedural states: Secondary | ICD-10-CM

## 2017-03-18 ENCOUNTER — Ambulatory Visit
Admission: RE | Admit: 2017-03-18 | Discharge: 2017-03-18 | Disposition: A | Discharge status: Home / Self Care | Payer: BLUE CROSS/BLUE SHIELD | Source: Ambulatory Visit | Attending: Nurse Practitioner | Admitting: Nurse Practitioner

## 2017-03-18 ENCOUNTER — Other Ambulatory Visit: Payer: Self-pay | Admitting: Nurse Practitioner

## 2017-03-18 DIAGNOSIS — M25561 Pain in right knee: Secondary | ICD-10-CM

## 2017-03-18 DIAGNOSIS — M1711 Unilateral primary osteoarthritis, right knee: Secondary | ICD-10-CM | POA: Diagnosis not present

## 2017-03-20 DIAGNOSIS — N898 Other specified noninflammatory disorders of vagina: Secondary | ICD-10-CM | POA: Diagnosis not present

## 2017-03-27 DIAGNOSIS — M1711 Unilateral primary osteoarthritis, right knee: Secondary | ICD-10-CM | POA: Diagnosis not present

## 2017-03-29 ENCOUNTER — Other Ambulatory Visit: Payer: Self-pay | Admitting: Hematology and Oncology

## 2017-03-29 ENCOUNTER — Other Ambulatory Visit: Payer: Self-pay | Admitting: Obstetrics and Gynecology

## 2017-03-29 DIAGNOSIS — Z9889 Other specified postprocedural states: Secondary | ICD-10-CM

## 2017-04-02 ENCOUNTER — Ambulatory Visit
Admission: RE | Admit: 2017-04-02 | Discharge: 2017-04-02 | Disposition: A | Discharge status: Home / Self Care | Payer: BLUE CROSS/BLUE SHIELD | Source: Ambulatory Visit | Attending: Obstetrics and Gynecology | Admitting: Obstetrics and Gynecology

## 2017-04-02 DIAGNOSIS — R928 Other abnormal and inconclusive findings on diagnostic imaging of breast: Secondary | ICD-10-CM | POA: Diagnosis not present

## 2017-04-02 DIAGNOSIS — Z9889 Other specified postprocedural states: Secondary | ICD-10-CM

## 2017-04-18 DIAGNOSIS — M1711 Unilateral primary osteoarthritis, right knee: Secondary | ICD-10-CM | POA: Diagnosis not present

## 2017-07-31 DIAGNOSIS — Z23 Encounter for immunization: Secondary | ICD-10-CM | POA: Diagnosis not present

## 2017-08-06 DIAGNOSIS — M1711 Unilateral primary osteoarthritis, right knee: Secondary | ICD-10-CM | POA: Diagnosis not present

## 2017-09-27 ENCOUNTER — Ambulatory Visit (HOSPITAL_BASED_OUTPATIENT_CLINIC_OR_DEPARTMENT_OTHER): Payer: BLUE CROSS/BLUE SHIELD | Admitting: Hematology and Oncology

## 2017-09-27 ENCOUNTER — Encounter: Payer: Self-pay | Admitting: *Deleted

## 2017-09-27 DIAGNOSIS — R413 Other amnesia: Secondary | ICD-10-CM | POA: Diagnosis not present

## 2017-09-27 DIAGNOSIS — C50412 Malignant neoplasm of upper-outer quadrant of left female breast: Secondary | ICD-10-CM

## 2017-09-27 DIAGNOSIS — R270 Ataxia, unspecified: Secondary | ICD-10-CM | POA: Diagnosis not present

## 2017-09-27 DIAGNOSIS — Z171 Estrogen receptor negative status [ER-]: Secondary | ICD-10-CM | POA: Diagnosis not present

## 2017-09-27 DIAGNOSIS — G3281 Cerebellar ataxia in diseases classified elsewhere: Secondary | ICD-10-CM

## 2017-09-27 NOTE — Progress Notes (Signed)
Patient Care Team: Seward Carol, MD as PCP - General (Internal Medicine) [name], MD as Consulting Physician (Surgical Oncology) Thea Silversmith, MD (Inactive) as Consulting Physician (Radiation Oncology) Nicholas Lose, MD as Consulting Physician (Hematology and Oncology) Sylvan Cheese, NP as Nurse Practitioner (Hematology and Oncology) Ena Dawley, MD as Consulting Physician (Obstetrics and Gynecology)  DIAGNOSIS:  Encounter Diagnoses  Name Primary?  . Malignant neoplasm of upper-outer quadrant of left breast in female, estrogen receptor negative ([place])   . Cerebellar ataxia in diseases classified elsewhere ([place])     SUMMARY OF ONCOLOGIC HISTORY:   Breast cancer of upper-outer quadrant of left female breast ([place])   07/28/2014 Mammogram    Left breast: possible mass warranting further imaging      08/05/2014 Breast US    Left breast 10 o clock: 2.3 x 2 x 1.4 cm lobular hypoechoeic mass      08/05/2014 Initial Biopsy    Left breast core needle bx: Invasive Mammary Carcinoma, grade 3, ER- (0%), PR- (0%), HER2/neu positive (ratio 3.74), Ki 67: 97%      08/12/2014 Procedure    Genetic testing: BreastNext panel Cephus Shelling) revealed no clinically significant variant at ATM, BARD1, BRCA1, BRCA2, BRIP1, CDH1, CHEK2, MRE11A, MUTYH, NBN, NF1, PALB2, PTEN, RAD50, RAD51C, RAD51D, and TP53.      08/17/2014 Breast MRI    Left breast known malignancy upper inner quadrant 2.6 cm: Suspicious non-mass enhancement upper-inner quadrant right breast biopsy pending      08/17/2014 Clinical Stage    Stage IIA: T2 N0      08/27/2014 - 12/17/2014 Neo-Adjuvant Chemotherapy    Docetaxel, carboplatin, trastuzumab, and pertuzumab every 3 weeks x 6 cycles      12/27/2014 Breast MRI    Significant treatment response. Previously biopsied mass is no longer visible, residual clumped linearly oriented non-mass enhancement anterior to the biopsied mass.      01/07/2015 - 08/23/2015  Chemotherapy    Maintenence trastuzumab q 3 weeks to complete one year of therapy      01/18/2015 Definitive Surgery    Left lumpectomy/[facility]): negative for invasive and insitu cancer - complete pathologic response. 0/4 LN.      01/18/2015 Pathologic Stage    ypTx ypN0      03/29/2015 - 05/13/2015 Radiation Therapy    Adjuvant RT Pablo Ledger): Left breast 45 Gy over 25 fractions. Left breast boost 16 Gy over 8 fractions      09/16/2015 Survivorship    Survivorship visit completed and copy of care plan provided to patient       CHIEF COMPLIANT: Complains of ataxia and memory loss  INTERVAL HISTORY: Julie [name] is a 61 year old with above-mentioned HER-2 positive breast cancer who had a complete pathologic response and completed radiation and is currently on surveillance.  She reports to me that she has been stumbling and falling very often.  She is also complaining of memory loss as well as difficulty in word finding.  REVIEW OF SYSTEMS:   Constitutional: Denies fevers, chills or abnormal weight loss Eyes: Denies blurriness of vision Ears, nose, mouth, throat, and face: Denies mucositis or sore throat Respiratory: Denies cough, dyspnea or wheezes Cardiovascular: Denies palpitation, chest discomfort Gastrointestinal:  Denies nausea, heartburn or change in bowel habits Skin: Denies abnormal skin rashes Lymphatics: Denies new lymphadenopathy or easy bruising Neurological: Stumbling and falling, word finding difficulty Behavioral/Psych: Mood is stable, no new changes  Extremities: No lower extremity edema Breast:  denies any pain or lumps or  nodules in either breasts All other systems were reviewed with the patient and are negative.  I have reviewed the past medical history, past surgical history, social history and family history with the patient and they are unchanged from previous note.  ALLERGIES:  is allergic to codeine; sulfa antibiotics; and  penicillins.  MEDICATIONS:  Current Outpatient Medications  Medication Sig Dispense Refill  . acetaminophen (TYLENOL) 500 MG tablet Take 500 mg by mouth every 6 (six) hours as needed.    Marland Kitchen buPROPion (WELLBUTRIN SR) 150 MG 12 hr tablet Take 150 mg by mouth every evening.     . fluticasone (FLONASE) 50 MCG/ACT nasal spray Place into both nostrils daily as needed.    . SOOLANTRA 1 % CREA   0   No current facility-administered medications for this visit.    Facility-Administered Medications Ordered in Other Visits  Medication Dose Route Frequency Provider Last Rate Last Dose  . Influenza vac split quadrivalent PF (FLUARIX) injection 0.5 mL  0.5 mL Intramuscular Tomorrow-1000 Nicholas Lose, MD        PHYSICAL EXAMINATION: ECOG PERFORMANCE STATUS: 1 - Symptomatic but completely ambulatory  Vitals:   09/27/17 0938  BP: 137/88  Pulse: 88  Resp: 20  Temp: 98.2 F (36.8 C)  SpO2: 99%   Filed Weights   09/27/17 0938  Weight: 194 lb 4.8 oz (88.1 kg)    GENERAL:alert, no distress and comfortable SKIN: skin color, texture, turgor are normal, no rashes or significant lesions EYES: normal, Conjunctiva are pink and non-injected, sclera clear OROPHARYNX:no exudate, no erythema and lips, buccal mucosa, and tongue normal  NECK: supple, thyroid normal size, non-tender, without nodularity LYMPH:  no palpable lymphadenopathy in the cervical, axillary or inguinal LUNGS: clear to auscultation and percussion with normal breathing effort HEART: regular rate & rhythm and no murmurs and no lower extremity edema ABDOMEN:abdomen soft, non-tender and normal bowel sounds MUSCULOSKELETAL:no cyanosis of digits and no clubbing  NEURO: alert & oriented x 3 with fluent speech, no focal motor/sensory deficits EXTREMITIES: No lower extremity edema, arthritis in the right knee  LABORATORY DATA:  I have reviewed the data as listed   Chemistry      Component Value Date/Time   NA 142 08/23/2015 0853   K 3.7  08/23/2015 0853   CL 105 05/04/2015 1245   CO2 28 08/23/2015 0853   BUN 17.3 08/23/2015 0853   CREATININE 1.0 08/23/2015 0853      Component Value Date/Time   CALCIUM 9.6 08/23/2015 0853   ALKPHOS 96 08/23/2015 0853   AST 18 08/23/2015 0853   ALT 19 08/23/2015 0853   BILITOT 0.42 08/23/2015 0853       Lab Results  Component Value Date   WBC 3.8 (L) 08/23/2015   HGB 12.7 08/23/2015   HCT 37.8 08/23/2015   MCV 93.5 08/23/2015   PLT 201 08/23/2015   NEUTROABS 2.4 08/23/2015    ASSESSMENT & PLAN:  Breast cancer of upper-outer quadrant of left female breast Left breast invasive ductal carcinomagrade 3 ER 0% PR 0% HER-2 positive ratio 3.74, Ki-67 90%: 2.3 cm mass by ultrasound clinical stage: T2, N0, M0 stage II A. Status post left lumpectomy 01/16/2015: Negative for in situ or invasive breast cancer 0/4 lymph nodes, complete pathologic response  Chemotherapy summary: Completed 6 cycles of neoadjuvant chemotherapy with Taxotere, carboplatin, Herceptin and Perjeta every 3 weeks started 08/28/15. Herceptin maintenance therapy completed 08/23/2015 Completed adjuvant radiation 05/13/2015  Breast Cancer Surveillance: 1. Breast exam 09/27/2017: No palpable lumps  or nodules 2. Mammogram  04/02/2017: Benign  Ataxia: We will obtain a brain MRI. Memory loss: I encouraged her to participate in clinical trial called remember which randomizes patients to Aricept versus placebo I will call her with the results of the brain MRI. We may have to see her sooner if she decides to participate in the clinical trial.  She was provided with all the information that she needs.  Patient tells me that she was in [place] and had a wonderful time and did not have any problems with gait. Return to clinic in one year.     I spent 25 minutes talking to the patient of which more than half was spent in counseling and coordination of care.  Orders Placed This Encounter  Procedures  . MR Brain W  Contrast    Standing Status:   Future    Standing Expiration Date:   09/27/2018    Order Specific Question:   If indicated for the ordered procedure, I authorize the administration of contrast media per Radiology protocol    Answer:   Yes    Order Specific Question:   What is the patient's sedation requirement?    Answer:   No Sedation    Order Specific Question:   Does the patient have a pacemaker or implanted devices?    Answer:   No    Order Specific Question:   Radiology Contrast Protocol - do NOT remove file path    Answer:   [email or web address]    Order Specific Question:   Reason for Exam additional comments    Answer:   Dysphasia and ataxia    Order Specific Question:   Preferred imaging location?    Answer:   [facility] (table limit-350 lbs)   The patient has a good understanding of the overall plan. she agrees with it. she will call with any problems that may develop before the next visit here.   Rulon Eisenmenger, MD 09/27/17

## 2017-09-27 NOTE — Assessment & Plan Note (Signed)
Left breast invasive ductal carcinomagrade 3 ER 0% PR 0% HER-2 positive ratio 3.74, Ki-67 90%: 2.3 cm mass by ultrasound clinical stage: T2, N0, M0 stage II A. Status post left lumpectomy 01/16/2015: Negative for in situ or invasive breast cancer 0/4 lymph nodes, complete pathologic response  Chemotherapy summary: Completed 6 cycles of neoadjuvant chemotherapy with Taxotere, carboplatin, Herceptin and Perjeta every 3 weeks started 08/28/15. Herceptin maintenance therapy completed 08/23/2015 Completed adjuvant radiation 05/13/2015  Breast Cancer Surveillance: 1. Breast exam 09/27/2017: No palpable lumps or nodules 2. Mammogram  04/02/2017: Benign  Return to clinic in one year.

## 2017-10-02 ENCOUNTER — Other Ambulatory Visit: Payer: Self-pay

## 2017-10-02 ENCOUNTER — Telehealth: Payer: Self-pay | Admitting: *Deleted

## 2017-10-02 DIAGNOSIS — R1310 Dysphagia, unspecified: Secondary | ICD-10-CM

## 2017-10-02 NOTE — Telephone Encounter (Signed)
Called patient regarding the Remember Study.  Patient states she has had time to read the consents and possible side effects of the study drug.  She would like to enroll in this study.  We discussed that she needs to have her MRI of brain done first before enrolling on this study.  She understands if MRI is normal then we can proceed, but otherwise she will be facing other treatment decisions and also not be eligible for this study.  Patient states she has not heard about the MRI yet. It has not been scheduled and she was planning on calling Keisha (breast navigator) today to follow up on it.  Informed patient I will speak with Dr. Geralyn Flash nurse or Varney Biles to see if they can help expedite the process.  I will call her back. She was appreciative.  Foye Spurling, BSN, RN Clinical Research Nurse 10/02/2017 9:22 AM

## 2017-10-02 NOTE — Telephone Encounter (Signed)
Call from [place], [place] that MRI is now pre-certed and scheduled. Called patient and informed her of MRI appointment on Friday 10/11/17 at 12 noon.  Please arrive at 11:45 am to [facility] MRI department.  Research nurse will follow up with patient on Monday 10/14/17 after Dr. Lindi Adie gets the results to discuss the study again.  Patient verbalized understanding.  Foye Spurling, BSN, RN Clinical Research Nurse 10/02/2017 4:13 PM

## 2017-10-02 NOTE — Telephone Encounter (Deleted)
Call from [place], [place] that MRI is now pre-certed and scheduled. Called patient and informed her of MRI appointment on Friday 10/11/17 at 12 noon.  Please arrive at 11:45 am to [facility] MRI department.  Research nurse will follow up with patient on Monday 10/14/17 after Dr. Lindi Adie gets the results to discuss the study again.  Patient verbalized understanding.  Foye Spurling, BSN, RN Clinical Research Nurse 10/02/2017 4:15 PM

## 2017-10-02 NOTE — Telephone Encounter (Signed)
Notified Dr. Geralyn Flash collaborative nurse, Jonelle Sidle, of patient's request to follow up on MRI and pre cert.  Foye Spurling, BSN, RN Clinical Research Nurse 10/02/2017 10:06 AM

## 2017-10-05 ENCOUNTER — Ambulatory Visit (HOSPITAL_COMMUNITY)
Admission: RE | Admit: 2017-10-05 | Discharge: 2017-10-05 | Disposition: A | Discharge status: Home / Self Care | Payer: BLUE CROSS/BLUE SHIELD | Source: Ambulatory Visit | Attending: Hematology and Oncology | Admitting: Hematology and Oncology

## 2017-10-05 DIAGNOSIS — R1310 Dysphagia, unspecified: Secondary | ICD-10-CM | POA: Insufficient documentation

## 2017-10-05 DIAGNOSIS — R413 Other amnesia: Secondary | ICD-10-CM | POA: Diagnosis not present

## 2017-10-05 DIAGNOSIS — R296 Repeated falls: Secondary | ICD-10-CM | POA: Diagnosis not present

## 2017-10-05 MED ORDER — GADOBENATE DIMEGLUMINE 529 MG/ML IV SOLN
20.0000 mL | Freq: Once | INTRAVENOUS | Status: AC | PRN
  Administered 2017-10-05: 19 mL via INTRAVENOUS

## 2017-10-08 ENCOUNTER — Telehealth: Payer: Self-pay

## 2017-10-08 ENCOUNTER — Telehealth: Payer: Self-pay | Admitting: Hematology and Oncology

## 2017-10-08 DIAGNOSIS — Z01411 Encounter for gynecological examination (general) (routine) with abnormal findings: Secondary | ICD-10-CM | POA: Diagnosis not present

## 2017-10-08 DIAGNOSIS — R87610 Atypical squamous cells of undetermined significance on cytologic smear of cervix (ASC-US): Secondary | ICD-10-CM | POA: Diagnosis not present

## 2017-10-08 DIAGNOSIS — C50919 Malignant neoplasm of unspecified site of unspecified female breast: Secondary | ICD-10-CM | POA: Diagnosis not present

## 2017-10-08 DIAGNOSIS — N898 Other specified noninflammatory disorders of vagina: Secondary | ICD-10-CM | POA: Diagnosis not present

## 2017-10-08 DIAGNOSIS — Z124 Encounter for screening for malignant neoplasm of cervix: Secondary | ICD-10-CM | POA: Diagnosis not present

## 2017-10-08 NOTE — Telephone Encounter (Signed)
I spoke to the patient and inform her that the brain MRI was normal. She was very excited to hear the news and will look forward to participating in the chemo brain study called "remember"

## 2017-10-08 NOTE — Telephone Encounter (Signed)
Pt had MRI on Friday and is asking for the results.

## 2017-10-11 ENCOUNTER — Ambulatory Visit (HOSPITAL_COMMUNITY): Payer: BLUE CROSS/BLUE SHIELD

## 2017-10-12 ENCOUNTER — Telehealth: Payer: Self-pay | Admitting: Hematology and Oncology

## 2017-10-12 NOTE — Telephone Encounter (Signed)
Scheduled appt per 12/14 sch message - left message with appt date and time and sent reminder letter in the mail with appt date and time.

## 2017-10-30 ENCOUNTER — Other Ambulatory Visit: Payer: BLUE CROSS/BLUE SHIELD

## 2017-10-30 ENCOUNTER — Encounter: Payer: Self-pay | Admitting: *Deleted

## 2017-10-30 ENCOUNTER — Other Ambulatory Visit: Payer: Self-pay | Admitting: *Deleted

## 2017-10-30 ENCOUNTER — Encounter: Payer: BLUE CROSS/BLUE SHIELD | Admitting: *Deleted

## 2017-10-30 DIAGNOSIS — Z171 Estrogen receptor negative status [ER-]: Principal | ICD-10-CM

## 2017-10-30 DIAGNOSIS — Z006 Encounter for examination for normal comparison and control in clinical research program: Secondary | ICD-10-CM

## 2017-10-30 DIAGNOSIS — C50412 Malignant neoplasm of upper-outer quadrant of left female breast: Secondary | ICD-10-CM

## 2017-10-30 LAB — RESEARCH LABS

## 2017-10-30 NOTE — Progress Notes (Signed)
Patient into clinic today to consent and begin study activities for clinical trial [id number].  See consent note encounter dated 09/27/17 for documentation/ details of today's visit and study procedures. Foye Spurling, BSN, RN Clinical Research Nurse 10/30/2017 12:22 PM

## 2017-10-31 ENCOUNTER — Encounter: Payer: Self-pay | Admitting: *Deleted

## 2017-10-31 DIAGNOSIS — C50412 Malignant neoplasm of upper-outer quadrant of left female breast: Secondary | ICD-10-CM

## 2017-10-31 DIAGNOSIS — Z171 Estrogen receptor negative status [ER-]: Principal | ICD-10-CM

## 2017-10-31 NOTE — Progress Notes (Signed)
Study drug was delivered today to our pharmacy.  Called patient to inform her of study drug available to pick up tomorrow.  Also informed her that research nurse will need to get height and weight tomorrow for the study.  It is required as part of the baseline assessments and it has been more than 30 days since her last height and weight in our clinic.  Patient verbalized understanding and agreed to come in around noon tomorrow. She will page research nurse when she arrives.  Foye Spurling, BSN, RN Clinical Research Nurse 10/31/2017 4:22 PM

## 2017-11-01 ENCOUNTER — Encounter: Payer: Self-pay | Admitting: *Deleted

## 2017-11-01 DIAGNOSIS — Z006 Encounter for examination for normal comparison and control in clinical research program: Secondary | ICD-10-CM | POA: Insufficient documentation

## 2017-11-01 NOTE — Progress Notes (Signed)
[id number] Clinical Trial;  Study drug delivered to [place] yesterday afternoon.  Notified patient that drug is available to pick up and she agreed to pick it up today at our clinic.  Obtained 2 study drug bottles from Pharmacist, Raul Del, who alerted this nurse that the patient initials on the labels are incorrect.  The initials read "CFD" instead of "CFP."  [place] pharmacy and spoke with Rocky Crafts, patient care coordinator, to inform of initials incorrect and to confirm these are the correct bottles for this patient.  She confirmed that the initials were typed in error as a handwritten "P" looked like a "D" during transcription.  She confirmed correct patient and study ID number and these are the correct bottles for this patient. Crossed out incorrect initials with one line and wrote correct initials on the labels. Verified correct study, corrected patient initials, patient study ID number, dose and instructions labeled on both bottles with pharmacist Brandy.  Bottle "A" is smaller and labeled as weeks 1-6, containing 42 tablets to complete weeks 1-6 at a dose of one tablet by mouth daily.  Bottle "B" is larger and labeled as weeks 7-24, containing 252 tablets to complete weeks 7-24 at a dose of two tablets by mouth daily.  Placed both bottles in large plastic bag along with medication diaries to complete 6 months of treatment.  Patient came to clinic to pick up medication. Instructed patient on taking bottle "A" once daily for first 6 weeks to be followed by bottle "B", 2 pills daily for remainder of 6 months.  Informed patient she may take with or without food at any time of day as long as that time of day is consistent. Reviewed medication diaries with patient and instructed patient on completing daily after taking study drug. Patient verbalized understanding and she plans on taking every morning with her other medications.  She plans to start study drug tomorrow morning  on 11/02/17. Obtained patient's height and weight without shoes along with her other VS on this visit for baseline data.  Patient's blood pressure is elevated and she reports she always feels nervous when she comes to the [place] even though she is not on treatment now.  Instructed patient to check her blood pressure at home regularly and let nurse know if gets worse.  Reminded patient this medication can, in rare instances, cause hypertension.  She verbalized understanding.  Completed Toxicity assessment form with patient today. Informed her research nurse will call her in 3 weeks and 6 weeks to assess how she is doing and reinforce instructions.  Asked her to please contact research nurse prior to 3 weeks if she has any side effects, questions or concerns.  I gave her another business card.  Patient will be scheduled to return to clinic in 12 weeks for next study visit which will include neurocognitive testing, questionnaires and nurse assessment.  Patient verbalized understanding.  Thanked patient for her time today and taking part in this study.  Foye Spurling, BSN, RN Clinical Research Nurse 11/01/2017 1:00 PM

## 2017-11-04 ENCOUNTER — Encounter: Payer: Self-pay | Admitting: Hematology and Oncology

## 2017-11-08 ENCOUNTER — Encounter: Payer: Self-pay | Admitting: *Deleted

## 2017-11-08 DIAGNOSIS — Z171 Estrogen receptor negative status [ER-]: Principal | ICD-10-CM

## 2017-11-08 DIAGNOSIS — C50412 Malignant neoplasm of upper-outer quadrant of left female breast: Secondary | ICD-10-CM

## 2017-11-08 NOTE — Progress Notes (Signed)
Patient called to report symptoms of headache, nausea and vomiting.  She started taking the study drug Donepezil or Placebo on Saturday 11/02/17.  Wednesday 11/06/17 she started having a "bad headache" for which she has been taking 2 tylenol 325 mg PRN- about twice a day.  Yesterday, Thursday 11/07/17 afternoon she developed nausea and had a 4 hr period of vomiting.  States she vomited 5 times.  Patient took a Compazine yesterday afternoon and she has not vomited again.  She woke up this morning still nauseated and still has a headache.  She took a Zofran this morning for the nausea and more tylenol for her headache.  She rates the headache 4 on 1-10 pain scale at this time.  Patient says she did not take the study drug this morning due to the nausea.  Patient says she is able to drink fluids and has been sipping on ginger ale.  She denies any fever or diarrhea. She is feeling better than yesterday and does not feel she needs to be seen by provider today.  Patient unsure if symptoms related to a virus or the study drug. Informed patient I will notify Dr. Lindi Adie for orders and call her back. Dr. Lindi Adie out of the office this afternoon so I relayed above information with Dr. Jana Hakim.   Dr. Jana Hakim advises patient to hold study drug until all her symptoms of headache, nausea and vomiting are clear.  Report to Dr. Lindi Adie on Monday for further orders.   Called patient back and instructed her to continue to hold the study drug until all her symptoms are resolved.  Encouraged her to push fluids.  I will call her back on Monday to see how she is feeling and then discuss with Dr. Lindi Adie.  Instructed patient to go to Urgent Care over the weekend if her symptoms worsen, especially if she develops a fever or feels dehydrated.  Patient verbalized understanding.  Foye Spurling, BSN, RN Clinical Research Nurse 11/08/2017 2:25 PM

## 2017-11-11 ENCOUNTER — Encounter: Payer: Self-pay | Admitting: *Deleted

## 2017-11-11 DIAGNOSIS — Z171 Estrogen receptor negative status [ER-]: Principal | ICD-10-CM

## 2017-11-11 DIAGNOSIS — C50412 Malignant neoplasm of upper-outer quadrant of left female breast: Secondary | ICD-10-CM

## 2017-11-11 NOTE — Progress Notes (Signed)
Called patient to follow up on symptoms of headache, nausea and vomiting she reported on Friday 11/08/17.  LVM for patient to return call to research nurse with update.  Foye Spurling, BSN, RN Clinical Research Nurse 11/11/2017 9:49 AM  Patient returned call and states her symptoms are resolved and she is feeling "much better" today.  She reports no further vomiting since Thurs 11/07/17.  Headache and nausea lingered through Saturday 11/09/17.  She has not taken any compazine since Friday 11/08/17.  She continued tylenol through Saturday 11/09/17.  Sunday 11/10/17 she did not have any headache, nausea or vomiting, but just felt tired.  Today she feels recovered.  Patient has been holding the study drug since Friday 11/08/17.  She asks if Dr. Lindi Adie thinks she should resume it tomorrow?  Informed patient I will report her symptoms to Dr. Lindi Adie and call her back with further instructions.  She verbalized understanding.  Foye Spurling, BSN, RN Clinical Research Nurse 11/11/2017 10:03 AM   Notified Dr. Lindi Adie of patient's symptoms and holding study drug.  He agrees since patient is feeling better she should resume the study drug tomorrow and call us if symptoms return or any other new problems/ concerns.   Called patient back and left detailed VM instructing her to resume study drug tomorrow morning and to please call back if any further problems or concerns.  Informed her that research nurse will call her for 3 week phone contact on 11/22/17.  Foye Spurling, BSN, RN Clinical Research Nurse 11/11/2017 11:42 AM

## 2017-11-13 ENCOUNTER — Encounter: Payer: Self-pay | Admitting: *Deleted

## 2017-11-13 DIAGNOSIS — T3390XA Superficial frostbite of unspecified sites, initial encounter: Secondary | ICD-10-CM | POA: Diagnosis not present

## 2017-11-13 DIAGNOSIS — C50412 Malignant neoplasm of upper-outer quadrant of left female breast: Secondary | ICD-10-CM

## 2017-11-13 DIAGNOSIS — L570 Actinic keratosis: Secondary | ICD-10-CM | POA: Diagnosis not present

## 2017-11-13 DIAGNOSIS — Z171 Estrogen receptor negative status [ER-]: Principal | ICD-10-CM

## 2017-11-13 DIAGNOSIS — L719 Rosacea, unspecified: Secondary | ICD-10-CM | POA: Diagnosis not present

## 2017-11-13 NOTE — Progress Notes (Signed)
Patient called to notify research nurse that she resumed taking study drug yesterday morning on 11/12/17 (after holding for 4 days due to Headache, nausea and vomiting).  Patient states she started to have a "Pounding headache" yesterday afternoon which she rates as 6/10 at it's worst on pain scale 1-10.  She took tylenol which helped lessen the pain.  She woke up this morning with nausea and denies vomiting. She did not take any nausea medication this morning and also did not take the study drug.  Patient went to see her Dermatologist this morning and she says her Blood pressure was elevated at 158/90.  She thinks it is unusual for her BP to be elevated the the Dermatologist since she is not nervous at their office like she is at our clinic.  Patient states she thinks the study drug is causing her symptoms and she does not want to take it any more.  Informed patient that research nurse will notify Dr. Lindi Adie of above for any further directions.  I will also find out what the study requests to be done at this point.  Asked patient if she is willing to come in again for further study procedures if requested, such as Neurocognitive testing, questionnaires and nurse assessment?  Patient agreed to come in for any procedures requested by the study. She says she is willing to allow the study to continue to collect information from her if they need. She just doesn't want to continue the study drug due symptoms.  Instructed patient to keep the study drug and diaries to return to [place] on next visit.   Notified Dr. Lindi Adie of patient's return of symptoms after resuming study drug yesterday.  He agrees patient should not continue on the study drug due to patient does not seem to be tolerating the drug.   Foye Spurling, BSN, RN Clinical Research Nurse 11/13/2017 12:00 PM

## 2017-11-14 ENCOUNTER — Other Ambulatory Visit: Payer: Self-pay | Admitting: *Deleted

## 2017-11-14 ENCOUNTER — Encounter: Payer: Self-pay | Admitting: Hematology and Oncology

## 2017-11-14 ENCOUNTER — Encounter: Payer: Self-pay | Admitting: *Deleted

## 2017-11-14 DIAGNOSIS — Z171 Estrogen receptor negative status [ER-]: Principal | ICD-10-CM

## 2017-11-14 DIAGNOSIS — C50412 Malignant neoplasm of upper-outer quadrant of left female breast: Secondary | ICD-10-CM

## 2017-11-14 NOTE — Progress Notes (Signed)
Communicated with WF [id number] study regarding patient stopping study drug due to side effects.  Principal Investigator for the study, Dr. Bunnie Philips, requests patient to complete the regularly scheduled assessments, as defined by protocol, if she is willing.   Called patient to see how she is feeling today.  She states much better.  Her nausea is resolved.  She still has a mild headache, but has not had to take any medication for it today.  She is unsure of her blood pressure since she does not have a way to check it at home.   Asked patient if she would be willing to continue on the study without the drug?  This would involve coming in for assessments at 3 months, 6 months and 9 months as previously discussed, just without taking the study drug.  Patient agreed, stating she would be happy to continue with the assessments.  Informed patient research nurse will continue to follow protocol schedule and call her at 3 weeks and 6 weeks from start date of taking study drug.  Patient is scheduled to come into our clinic for 12 week assessments on 01/23/18.   Asked patient if she can come in earlier at 9 am or the day before due to conflict with another patient's schedule.  She agreed to come in at 9 am on 01/23/18 for assessments which will include Neurocognitive testing and nurse assessment.   Asked patient to mail back her diaries.  I am mailing out a SASE for her to use to return completed and uncompleted diaries. Instructed patient to hold onto the study drug to return to [place] on her next visit on 01/23/18.  She verbalized understanding.  Thanked patient very much for her willingness to continue on this study although she will not be taking the study drug. Dr. Lindi Adie notified and agrees with above plan.  Foye Spurling, BSN, RN Clinical Research Nurse 11/14/2017 10:43 AM

## 2017-11-15 ENCOUNTER — Other Ambulatory Visit: Payer: Self-pay | Admitting: *Deleted

## 2017-11-15 DIAGNOSIS — Z006 Encounter for examination for normal comparison and control in clinical research program: Secondary | ICD-10-CM

## 2017-11-15 MED ORDER — INV-DONEPEZIL/PLACEBO 5MG TABS WF 97116: 1.0000 | ORAL_TABLET | Freq: Every day | ORAL | 0 refills | Status: DC

## 2017-11-22 ENCOUNTER — Encounter: Payer: Self-pay | Admitting: *Deleted

## 2017-11-22 DIAGNOSIS — C50412 Malignant neoplasm of upper-outer quadrant of left female breast: Secondary | ICD-10-CM

## 2017-11-22 DIAGNOSIS — Z171 Estrogen receptor negative status [ER-]: Principal | ICD-10-CM
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
## 2017-12-12 DIAGNOSIS — T3390XA Superficial frostbite of unspecified sites, initial encounter: Secondary | ICD-10-CM | POA: Diagnosis not present

## 2017-12-13 ENCOUNTER — Encounter: Payer: Self-pay | Admitting: *Deleted

## 2017-12-13 DIAGNOSIS — Z171 Estrogen receptor negative status [ER-]: Principal | ICD-10-CM

## 2017-12-13 DIAGNOSIS — C50412 Malignant neoplasm of upper-outer quadrant of left female breast: Secondary | ICD-10-CM
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
## 2018-01-07 DIAGNOSIS — J0100 Acute maxillary sinusitis, unspecified: Secondary | ICD-10-CM | POA: Diagnosis not present

## 2018-01-22 ENCOUNTER — Telehealth: Payer: Self-pay | Admitting: *Deleted

## 2018-01-22 NOTE — Telephone Encounter (Signed)
Called patient to remind her of research appointment tomorrow for week 12 of the Remember study.  Reminded patient to bring her study bottles with leftover pills with her tomorrow. She confirmed and verbalized understanding.  Thanked patient for her time and look forward to seeing her tomorrow.  Foye Spurling, BSN, RN Clinical Research Nurse 01/22/2018 11:00 AM

## 2018-01-23 ENCOUNTER — Encounter: Payer: BLUE CROSS/BLUE SHIELD | Admitting: *Deleted

## 2018-01-23 ENCOUNTER — Encounter: Payer: Self-pay | Admitting: *Deleted

## 2018-01-23 DIAGNOSIS — Z171 Estrogen receptor negative status [ER-]: Principal | ICD-10-CM

## 2018-01-23 DIAGNOSIS — C50412 Malignant neoplasm of upper-outer quadrant of left female breast: Secondary | ICD-10-CM
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
## 2018-01-28 ENCOUNTER — Telehealth: Payer: Self-pay | Admitting: *Deleted

## 2018-01-28 NOTE — Telephone Encounter (Signed)
Left VM for patient informing we did make appointment for next visit for the "Remember" study on 04/17/18 at 9 am as discussed.  Asked her to call back if any questions or concerns prior to that visit or if she needs to change the appointment. Thanked her for her ongoing participation in this study.  Foye Spurling, BSN, RN Clinical Research Nurse 01/28/2018 4:11 PM

## 2018-01-30 DIAGNOSIS — Z0000 Encounter for general adult medical examination without abnormal findings: Secondary | ICD-10-CM | POA: Diagnosis not present

## 2018-01-30 DIAGNOSIS — F419 Anxiety disorder, unspecified: Secondary | ICD-10-CM | POA: Diagnosis not present

## 2018-01-30 DIAGNOSIS — E663 Overweight: Secondary | ICD-10-CM | POA: Diagnosis not present

## 2018-01-30 DIAGNOSIS — C50912 Malignant neoplasm of unspecified site of left female breast: Secondary | ICD-10-CM | POA: Diagnosis not present

## 2018-01-30 DIAGNOSIS — E7800 Pure hypercholesterolemia, unspecified: Secondary | ICD-10-CM | POA: Diagnosis not present

## 2018-01-31 DIAGNOSIS — H01022 Squamous blepharitis right lower eyelid: Secondary | ICD-10-CM | POA: Diagnosis not present

## 2018-01-31 DIAGNOSIS — B0239 Other herpes zoster eye disease: Secondary | ICD-10-CM | POA: Diagnosis not present

## 2018-01-31 DIAGNOSIS — H2513 Age-related nuclear cataract, bilateral: Secondary | ICD-10-CM | POA: Diagnosis not present

## 2018-01-31 DIAGNOSIS — H01021 Squamous blepharitis right upper eyelid: Secondary | ICD-10-CM | POA: Diagnosis not present

## 2018-02-19 ENCOUNTER — Other Ambulatory Visit: Payer: Self-pay | Admitting: Hematology and Oncology

## 2018-02-19 DIAGNOSIS — Z139 Encounter for screening, unspecified: Secondary | ICD-10-CM

## 2018-04-01 ENCOUNTER — Other Ambulatory Visit: Payer: Self-pay | Admitting: Hematology and Oncology

## 2018-04-01 DIAGNOSIS — Z853 Personal history of malignant neoplasm of breast: Secondary | ICD-10-CM

## 2018-04-03 ENCOUNTER — Ambulatory Visit
Admission: RE | Admit: 2018-04-03 | Discharge: 2018-04-03 | Disposition: A | Discharge status: Home / Self Care | Payer: BLUE CROSS/BLUE SHIELD | Source: Ambulatory Visit | Attending: Hematology and Oncology | Admitting: Hematology and Oncology

## 2018-04-03 DIAGNOSIS — Z853 Personal history of malignant neoplasm of breast: Secondary | ICD-10-CM

## 2018-04-03 DIAGNOSIS — R928 Other abnormal and inconclusive findings on diagnostic imaging of breast: Secondary | ICD-10-CM | POA: Diagnosis not present

## 2018-04-03 HISTORY — DX: Personal history of irradiation: Z92.3

## 2018-04-03 HISTORY — DX: Personal history of antineoplastic chemotherapy: Z92.21

## 2018-04-17 ENCOUNTER — Encounter: Payer: Self-pay | Admitting: *Deleted

## 2018-04-17 ENCOUNTER — Encounter: Payer: BLUE CROSS/BLUE SHIELD | Admitting: *Deleted

## 2018-04-17 DIAGNOSIS — Z171 Estrogen receptor negative status [ER-]: Principal | ICD-10-CM

## 2018-04-17 DIAGNOSIS — C50412 Malignant neoplasm of upper-outer quadrant of left female breast: Secondary | ICD-10-CM

## 2018-04-17 NOTE — Progress Notes (Signed)
Week 24 Assessment visit for Remember [facility] [id number]; Patient into clinic by herself today. I greeted her in the lobby and escorted her to private conference room for study assessments.  Nurse assessment;  Patient denies any new or worsening AEs including Anorexia, Constitutional Symptoms, Dizziness, Headaches, Muscle weakness, Vomiting and Weight loss. She does report intermittent insomnia as ongoing issue. She does not take any medication for this.   Patient is staying very active, working with a Physiological scientist and performing all her instrumental and personal ADLs as usual.ECOG = 0. Concomitant medications; Patient denies any changes to current medication list since last visit.  Informed patient of last study visit due for week 36 on 07/12/18 which is a Saturday so we will schedule visit for the Friday 9/13//19 at 9 am.   Patient agreed.  Questionnaires; Gave patient study questionnaires to complete and informed her that research specialist, Remer Macho, will be in next to collect completed questionnaires and to administer neurocognitive testing. Thanked patient again for her time and participation.  Encouraged her to call research nurse if any questions or concerns prior to next contact. She verbalized understanding. Neurocognitive tests; Remer Macho administered the Neurocognitive tests for week 24.   Adverse Events Review 01/24/18-04/17/18  Event Grade Onset Date Resolved Date Comment  Insomnia 1 11/29/17 Ongoing, intermittent        Foye Spurling, BSN, RN Clinical Research Nurse 04/17/2018 9:56 AM

## 2018-07-10 ENCOUNTER — Telehealth: Payer: Self-pay | Admitting: *Deleted

## 2018-07-10 NOTE — Telephone Encounter (Signed)
Left VM for patient reminding her of tomorrow's research study visit at 9 am.  Informed patient that research nurse will meet her in the lobby at 9 am tomorrow.  Asked her to please call back if she cannot make this appointment or any other questions or concerns.   Foye Spurling, BSN, RN Clinical Research Nurse 07/10/2018 1:03 PM

## 2018-07-11 ENCOUNTER — Inpatient Hospital Stay: Payer: BLUE CROSS/BLUE SHIELD | Attending: Hematology and Oncology | Admitting: *Deleted

## 2018-07-11 DIAGNOSIS — C50412 Malignant neoplasm of upper-outer quadrant of left female breast: Secondary | ICD-10-CM

## 2018-07-11 NOTE — Progress Notes (Signed)
Week 36 Assessment visit for Remember [facility] [id number]; Patient into clinic by herself today. I greeted her in the lobby and escorted her to private conference room for study assessments. Nurse assessment:Patient denies any new or worsening AEs including Anorexia, Constitutional Symptoms, Dizziness, Headaches, Muscle weakness,Vomiting and Weight loss. She also reports intermittent insomnia has resolved 2 months ago since she switched beds.    Patient stays very activeand performsall her instrumental and personal ADLs without difficulty. ECOG = 0. Concomitant medications; Patient denies any changes to current medication list since last visit. Questionnaires; Gave patient study questionnaires to complete and informed her that research specialist, Remer Macho, will be in next to collect completed questionnaires and to administer neurocognitive testing.  This is patient's last study visit and I thanked patient very much for her participation and completing this study even though she had to discontinue the study drug early in the study.  Informed patient she may leave after she is done with the Neurocognitive testing.  Patient asked about her next appointment with Dr. Lindi Adie and I will ask Vicente Males to bring her a copy of her schedule.  Neurocognitive tests: Remer Macho administered the Neurocognitive tests for week 36.  Vicente Males also collected the completed questionnaires and she gave patient a copy of her schedule.   Adverse Events Review 04/18/18-07/11/18  Event Grade Onset Date Resolved Date Comment  Insomnia 1 11/29/17 05/12/18     Foye Spurling, BSN, RN Clinical Research Nurse 07/11/2018 10:03 AM

## 2018-09-10 DIAGNOSIS — Z23 Encounter for immunization: Secondary | ICD-10-CM | POA: Diagnosis not present

## 2018-09-30 NOTE — Progress Notes (Signed)
Patient Care Team: Seward Carol, MD as PCP - General (Internal Medicine) [name], MD as Consulting Physician (Surgical Oncology) Thea Silversmith, MD as Consulting Physician (Radiation Oncology) Nicholas Lose, MD as Consulting Physician (Hematology and Oncology) Sylvan Cheese, NP as Nurse Practitioner (Hematology and Oncology) Ena Dawley, MD as Consulting Physician (Obstetrics and Gynecology) Jari Pigg, MD as Consulting Physician (Dermatology)  DIAGNOSIS:    ICD-10-CM   1. Malignant neoplasm of upper-outer quadrant of left female breast, unspecified estrogen receptor status ([place]) C50.412     SUMMARY OF ONCOLOGIC HISTORY:   Breast cancer of upper-outer quadrant of left female breast ([place])   07/28/2014 Mammogram    Left breast: possible mass warranting further imaging    08/05/2014 Breast US    Left breast 10 o clock: 2.3 x 2 x 1.4 cm lobular hypoechoeic mass    08/05/2014 Initial Biopsy    Left breast core needle bx: Invasive Mammary Carcinoma, grade 3, ER- (0%), PR- (0%), HER2/neu positive (ratio 3.74), Ki 67: 97%    08/12/2014 Procedure    Genetic testing: BreastNext panel Cephus Shelling) revealed no clinically significant variant at ATM, BARD1, BRCA1, BRCA2, BRIP1, CDH1, CHEK2, MRE11A, MUTYH, NBN, NF1, PALB2, PTEN, RAD50, RAD51C, RAD51D, and TP53.    08/17/2014 Breast MRI    Left breast known malignancy upper inner quadrant 2.6 cm: Suspicious non-mass enhancement upper-inner quadrant right breast biopsy pending    08/17/2014 Clinical Stage    Stage IIA: T2 N0    08/27/2014 - 12/17/2014 Neo-Adjuvant Chemotherapy    Docetaxel, carboplatin, trastuzumab, and pertuzumab every 3 weeks x 6 cycles    12/27/2014 Breast MRI    Significant treatment response. Previously biopsied mass is no longer visible, residual clumped linearly oriented non-mass enhancement anterior to the biopsied mass.    01/07/2015 - 08/23/2015 Chemotherapy    Maintenence trastuzumab q 3 weeks  to complete one year of therapy    01/18/2015 Definitive Surgery    Left lumpectomy/[facility]): negative for invasive and insitu cancer - complete pathologic response. 0/4 LN.    01/18/2015 Pathologic Stage    ypTx ypN0    03/29/2015 - 05/13/2015 Radiation Therapy    Adjuvant RT Pablo Ledger): Left breast 45 Gy over 25 fractions. Left breast boost 16 Gy over 8 fractions    09/16/2015 Survivorship    Survivorship visit completed and copy of care plan provided to patient     CHIEF COMPLIANT: Surveillance of left breast cancer  INTERVAL HISTORY: Julie [name] is a 62 y.o. with above-mentioned history of HER-2 positive left breast cancer who had a complete pathologic response and completed radiation and is currently on surveillance. I last saw the patient one year ago. Her most recent mammogram on 04/03/18 shows no malignancy in either breast. She has been participating in the "remember" research trial.   She presents to the clinic today alone. She notes the "remember" study went well and she enjoyed it despite side effects of the medication during the study. She notes her memory has improved and that aphasia is only present when she is tired. Her aphasia worsens with statins, which she has tried taking for high cholesterol, per her PCP. She expressed desire to try a different medication to lower her cholesterol. She notes she is trying to adhere to a vegetarian diet. She walks often and visits a trainer twice a week.   REVIEW OF SYSTEMS:   Constitutional: Denies fevers, chills or abnormal weight loss Eyes: Denies blurriness of vision Ears, nose, mouth, throat,  and face: Denies mucositis or sore throat Respiratory: Denies cough, dyspnea or wheezes Cardiovascular: Denies palpitation, chest discomfort Gastrointestinal:  Denies nausea, heartburn or change in bowel habits Skin: Denies abnormal skin rashes Lymphatics: Denies new lymphadenopathy or easy bruising Neurological:Denies numbness,  tingling or new weaknesses Behavioral/Psych: Mood is stable, no new changes (+) occasional aphasia Extremities: No lower extremity edema Breast: denies any pain or lumps or nodules in either breasts All other systems were reviewed with the patient and are negative.  I have reviewed the past medical history, past surgical history, social history and family history with the patient and they are unchanged from previous note.  ALLERGIES:  is allergic to codeine; sulfa antibiotics; zithromax [azithromycin]; and penicillins.  MEDICATIONS:  Current Outpatient Medications  Medication Sig Dispense Refill  . acetaminophen (TYLENOL) 325 MG tablet Take 650 mg by mouth every 6 (six) hours as needed for mild pain.    Marland Kitchen buPROPion (WELLBUTRIN SR) 150 MG 12 hr tablet Take 150 mg by mouth every evening.     . fluticasone (FLONASE) 50 MCG/ACT nasal spray Place into both nostrils daily as needed.    Marland Kitchen ibuprofen (ADVIL,MOTRIN) 200 MG tablet Take 400 mg by mouth 2 (two) times daily as needed.    Robb Matar 1 % CREA Apply once daily as needed  0   No current facility-administered medications for this visit.    Facility-Administered Medications Ordered in Other Visits  Medication Dose Route Frequency Provider Last Rate Last Dose  . Influenza vac split quadrivalent PF (FLUARIX) injection 0.5 mL  0.5 mL Intramuscular Tomorrow-1000 Nicholas Lose, MD        PHYSICAL EXAMINATION: ECOG PERFORMANCE STATUS: 1 - Symptomatic but completely ambulatory  Vitals:   10/01/18 1450  BP: (!) 148/78  Pulse: 79  Resp: 18  Temp: 97.9 F (36.6 C)  SpO2: 99%   Filed Weights   10/01/18 1450  Weight: 190 lb 12.8 oz (86.5 kg)    GENERAL:alert, no distress and comfortable SKIN: skin color, texture, turgor are normal, no rashes or significant lesions EYES: normal, Conjunctiva are pink and non-injected, sclera clear OROPHARYNX:no exudate, no erythema and lips, buccal mucosa, and tongue normal  NECK: supple, thyroid  normal size, non-tender, without nodularity LYMPH:  no palpable lymphadenopathy in the cervical, axillary or inguinal LUNGS: clear to auscultation and percussion with normal breathing effort HEART: regular rate & rhythm and no murmurs and no lower extremity edema ABDOMEN:abdomen soft, non-tender and normal bowel sounds MUSCULOSKELETAL:no cyanosis of digits and no clubbing  NEURO: alert & oriented x 3 with fluent speech, no focal motor/sensory deficits EXTREMITIES: No lower extremity edema BREAST: No palpable masses or nodules in either right or left breasts. No palpable axillary supraclavicular or infraclavicular adenopathy no breast tenderness or nipple discharge. (exam performed in the presence of a chaperone)  LABORATORY DATA:  I have reviewed the data as listed CMP Latest Ref Rng & Units 08/23/2015 06/01/2015 05/04/2015  Glucose 70 - 140 mg/dl 101 102 97  BUN 7.0 - 26.0 mg/dL 17.3 14.3 12  Creatinine 0.6 - 1.1 mg/dL 1.0 0.9 0.82  Sodium 136 - 145 mEq/L 142 143 140  Potassium 3.5 - 5.1 mEq/L 3.7 4.3 3.9  Chloride 101 - 111 mmol/L - - 105  CO2 22 - 29 mEq/L 28 30(H) 30  Calcium 8.4 - 10.4 mg/dL 9.6 9.9 9.5  Total Protein 6.4 - 8.3 g/dL 6.7 6.9 7.3  Total Bilirubin 0.20 - 1.20 mg/dL 0.42 0.48 0.5  Alkaline Phos 40 -  150 U/L 96 100 97  AST 5 - 34 U/L _0 ALT 0 - 55 U/L _1 Lab Results  Component Value Date   WBC 3.8 (L) 08/23/2015   HGB 12.7 08/23/2015   HCT 37.8 08/23/2015   MCV 93.5 08/23/2015   PLT 201 08/23/2015   NEUTROABS 2.4 08/23/2015    ASSESSMENT & PLAN:  Breast cancer of upper-outer quadrant of left female breast Left breast invasive ductal carcinomagrade 3 ER 0% PR 0% HER-2 positive ratio 3.74, Ki-67 90%: 2.3 cm mass by ultrasound clinical stage: T2, N0, M0 stage II A. Status post left lumpectomy 01/16/2015: Negative for in situ or invasive breast cancer 0/4 lymph nodes, complete pathologic response  Chemotherapy summary: Completed 6 cycles of  neoadjuvant chemotherapy with Taxotere, carboplatin, Herceptin and Perjeta every 3 weeks started 08/28/15. Herceptin maintenance therapy completed 08/23/2015 Completed adjuvant radiation 05/13/2015  Breast Cancer Surveillance: 1. Breast exam12/01/2018: No palpable lumps or nodules 2. Mammogram 04/02/2017: Benign  Ataxia: MRI brain 10/05/2017: Normal Memory loss: on REMEMBER study; patient was unable to tolerate the study medication.  In spite of that her memory has improved.  Return to clinic in 1 year for follow-up  No orders of the defined types were placed in this encounter.  The patient has a good understanding of the overall plan. she agrees with it. she will call with any problems that may develop before the next visit here.  Nicholas Lose, MD 10/01/2018   I, Molly Dorshimer, am acting as scribe for Nicholas Lose, MD.  I have reviewed the above documentation for accuracy and completeness, and I agree with the above.

## 2018-09-30 NOTE — Assessment & Plan Note (Signed)
Left breast invasive ductal carcinomagrade 3 ER 0% PR 0% HER-2 positive ratio 3.74, Ki-67 90%: 2.3 cm mass by ultrasound clinical stage: T2, N0, M0 stage II A. Status post left lumpectomy 01/16/2015: Negative for in situ or invasive breast cancer 0/4 lymph nodes, complete pathologic response  Chemotherapy summary: Completed 6 cycles of neoadjuvant chemotherapy with Taxotere, carboplatin, Herceptin and Perjeta every 3 weeks started 08/28/15. Herceptin maintenance therapy completed 08/23/2015 Completed adjuvant radiation 05/13/2015  Breast Cancer Surveillance: 1. Breast exam12/01/2018: No palpable lumps or nodules 2. Mammogram 04/02/2017: Benign  Ataxia: We will obtain a brain MRI. Memory loss: on REMEMBER study

## 2018-10-01 ENCOUNTER — Inpatient Hospital Stay: Payer: BLUE CROSS/BLUE SHIELD | Attending: Hematology and Oncology | Admitting: Hematology and Oncology

## 2018-10-01 ENCOUNTER — Telehealth: Payer: Self-pay | Admitting: Hematology and Oncology

## 2018-10-01 DIAGNOSIS — Z923 Personal history of irradiation: Secondary | ICD-10-CM | POA: Diagnosis not present

## 2018-10-01 DIAGNOSIS — Z853 Personal history of malignant neoplasm of breast: Secondary | ICD-10-CM | POA: Insufficient documentation

## 2018-10-01 DIAGNOSIS — E7800 Pure hypercholesterolemia, unspecified: Secondary | ICD-10-CM | POA: Insufficient documentation

## 2018-10-01 DIAGNOSIS — Z79899 Other long term (current) drug therapy: Secondary | ICD-10-CM | POA: Diagnosis not present

## 2018-10-01 DIAGNOSIS — Z9221 Personal history of antineoplastic chemotherapy: Secondary | ICD-10-CM | POA: Insufficient documentation

## 2018-10-01 DIAGNOSIS — C50412 Malignant neoplasm of upper-outer quadrant of left female breast: Secondary | ICD-10-CM

## 2018-10-01 NOTE — Telephone Encounter (Signed)
Gave patient appointment for December 2020. Patient declined printout.

## 2018-11-24 DIAGNOSIS — L719 Rosacea, unspecified: Secondary | ICD-10-CM | POA: Diagnosis not present

## 2019-02-25 DIAGNOSIS — F419 Anxiety disorder, unspecified: Secondary | ICD-10-CM | POA: Diagnosis not present

## 2019-02-25 DIAGNOSIS — E663 Overweight: Secondary | ICD-10-CM | POA: Diagnosis not present

## 2019-02-25 DIAGNOSIS — C50912 Malignant neoplasm of unspecified site of left female breast: Secondary | ICD-10-CM | POA: Diagnosis not present

## 2019-02-25 DIAGNOSIS — E7800 Pure hypercholesterolemia, unspecified: Secondary | ICD-10-CM | POA: Diagnosis not present

## 2019-05-04 ENCOUNTER — Other Ambulatory Visit: Payer: Self-pay | Admitting: Hematology and Oncology

## 2019-05-04 DIAGNOSIS — Z853 Personal history of malignant neoplasm of breast: Secondary | ICD-10-CM

## 2019-05-18 ENCOUNTER — Ambulatory Visit
Admission: RE | Admit: 2019-05-18 | Discharge: 2019-05-18 | Disposition: A | Discharge status: Home / Self Care | Payer: BC Managed Care – PPO | Source: Ambulatory Visit | Attending: Hematology and Oncology | Admitting: Hematology and Oncology

## 2019-05-18 ENCOUNTER — Other Ambulatory Visit: Payer: Self-pay

## 2019-05-18 DIAGNOSIS — R928 Other abnormal and inconclusive findings on diagnostic imaging of breast: Secondary | ICD-10-CM | POA: Diagnosis not present

## 2019-05-18 DIAGNOSIS — Z853 Personal history of malignant neoplasm of breast: Secondary | ICD-10-CM | POA: Diagnosis not present

## 2019-07-03 DIAGNOSIS — E7800 Pure hypercholesterolemia, unspecified: Secondary | ICD-10-CM | POA: Diagnosis not present

## 2019-07-03 DIAGNOSIS — C50912 Malignant neoplasm of unspecified site of left female breast: Secondary | ICD-10-CM | POA: Diagnosis not present

## 2019-07-09 DIAGNOSIS — C50912 Malignant neoplasm of unspecified site of left female breast: Secondary | ICD-10-CM | POA: Diagnosis not present

## 2019-09-17 DIAGNOSIS — Z20828 Contact with and (suspected) exposure to other viral communicable diseases: Secondary | ICD-10-CM | POA: Diagnosis not present

## 2019-09-29 NOTE — Progress Notes (Signed)
Patient Care Team: Seward Carol, MD as PCP - General (Internal Medicine) [name], MD as Consulting Physician (Surgical Oncology) Thea Silversmith, MD as Consulting Physician (Radiation Oncology) Nicholas Lose, MD as Consulting Physician (Hematology and Oncology) Sylvan Cheese, NP as Nurse Practitioner (Hematology and Oncology) Ena Dawley, MD as Consulting Physician (Obstetrics and Gynecology) Jari Pigg, MD as Consulting Physician (Dermatology)  DIAGNOSIS:    ICD-10-CM   1. Malignant neoplasm of upper-outer quadrant of left breast in female, estrogen receptor negative ([place])  C50.412    Z17.1     SUMMARY OF ONCOLOGIC HISTORY: Oncology History  Breast cancer of upper-outer quadrant of left female breast ([place])  07/28/2014 Mammogram   Left breast: possible mass warranting further imaging   08/05/2014 Breast US   Left breast 10 o clock: 2.3 x 2 x 1.4 cm lobular hypoechoeic mass   08/05/2014 Initial Biopsy   Left breast core needle bx: Invasive Mammary Carcinoma, grade 3, ER- (0%), PR- (0%), HER2/neu positive (ratio 3.74), Ki 67: 97%   08/12/2014 Procedure   Genetic testing: BreastNext panel Cephus Shelling) revealed no clinically significant variant at ATM, BARD1, BRCA1, BRCA2, BRIP1, CDH1, CHEK2, MRE11A, MUTYH, NBN, NF1, PALB2, PTEN, RAD50, RAD51C, RAD51D, and TP53.   08/17/2014 Breast MRI   Left breast known malignancy upper inner quadrant 2.6 cm: Suspicious non-mass enhancement upper-inner quadrant right breast biopsy pending   08/17/2014 Clinical Stage   Stage IIA: T2 N0   08/27/2014 - 12/17/2014 Neo-Adjuvant Chemotherapy   Docetaxel, carboplatin, trastuzumab, and pertuzumab every 3 weeks x 6 cycles   12/27/2014 Breast MRI   Significant treatment response. Previously biopsied mass is no longer visible, residual clumped linearly oriented non-mass enhancement anterior to the biopsied mass.   01/07/2015 - 08/23/2015 Chemotherapy   Maintenence trastuzumab q 3  weeks to complete one year of therapy   01/18/2015 Definitive Surgery   Left lumpectomy/[facility]): negative for invasive and insitu cancer - complete pathologic response. 0/4 LN.   01/18/2015 Pathologic Stage   ypTx ypN0   03/29/2015 - 05/13/2015 Radiation Therapy   Adjuvant RT Pablo Ledger): Left breast 45 Gy over 25 fractions. Left breast boost 16 Gy over 8 fractions   09/16/2015 Survivorship   Survivorship visit completed and copy of care plan provided to patient     CHIEF COMPLIANT: Surveillance of left breast cancer  INTERVAL HISTORY: Julie [name] is a 63 y.o. with above-mentioned history of HER-2 positive left breast cancer treated with neoadjuvant chemotherapy with complete pathologic response, lumpectomy, adjuvant Herceptin maintenance, radiation, and is currently on surveillance. Mammogram on 05/18/19 showed no evidence of malignancy bilaterally. She presents to the clinic today for annual follow-up.  Tenderness in the surgical site of the left breast she does not have any problems or concerns.  She is taking appropriate COVID-19 precautions.  REVIEW OF SYSTEMS:   Constitutional: Denies fevers, chills or abnormal weight loss Eyes: Denies blurriness of vision Ears, nose, mouth, throat, and face: Denies mucositis or sore throat Respiratory: Denies cough, dyspnea or wheezes Cardiovascular: Denies palpitation, chest discomfort Gastrointestinal: Denies nausea, heartburn or change in bowel habits Skin: Denies abnormal skin rashes Lymphatics: Denies new lymphadenopathy or easy bruising Neurological: Denies numbness, tingling or new weaknesses Behavioral/Psych: Mood is stable, no new changes  Extremities: No lower extremity edema Breast: Tenderness of the surgical site of the left breast All other systems were reviewed with the patient and are negative.  I have reviewed the past medical history, past surgical history, social history and family history with  the patient and  they are unchanged from previous note.  ALLERGIES:  is allergic to codeine; sulfa antibiotics; zithromax [azithromycin]; and penicillins.  MEDICATIONS:  Current Outpatient Medications  Medication Sig Dispense Refill  . acetaminophen (TYLENOL) 325 MG tablet Take 650 mg by mouth every 6 (six) hours as needed for mild pain.    Marland Kitchen buPROPion (WELLBUTRIN SR) 150 MG 12 hr tablet Take 150 mg by mouth every evening.     . fluticasone (FLONASE) 50 MCG/ACT nasal spray Place into both nostrils daily as needed.    Marland Kitchen ibuprofen (ADVIL,MOTRIN) 200 MG tablet Take 400 mg by mouth 2 (two) times daily as needed.    Robb Matar 1 % CREA Apply once daily as needed  0   No current facility-administered medications for this visit.    Facility-Administered Medications Ordered in Other Visits  Medication Dose Route Frequency Provider Last Rate Last Dose  . Influenza vac split quadrivalent PF (FLUARIX) injection 0.5 mL  0.5 mL Intramuscular Tomorrow-1000 Nicholas Lose, MD        PHYSICAL EXAMINATION: ECOG PERFORMANCE STATUS: 1 - Symptomatic but completely ambulatory  Vitals:   09/30/19 1414  BP: (!) 154/99  Pulse: 72  Resp: 18  Temp: 97.8 F (36.6 C)  SpO2: 99%   Filed Weights   09/30/19 1414  Weight: 189 lb 1.6 oz (85.8 kg)    GENERAL: alert, no distress and comfortable SKIN: skin color, texture, turgor are normal, no rashes or significant lesions EYES: normal, Conjunctiva are pink and non-injected, sclera clear OROPHARYNX: no exudate, no erythema and lips, buccal mucosa, and tongue normal  NECK: supple, thyroid normal size, non-tender, without nodularity LYMPH: no palpable lymphadenopathy in the cervical, axillary or inguinal LUNGS: clear to auscultation and percussion with normal breathing effort HEART: regular rate & rhythm and no murmurs and no lower extremity edema ABDOMEN: abdomen soft, non-tender and normal bowel sounds MUSCULOSKELETAL: no cyanosis of digits and no clubbing  NEURO: alert &  oriented x 3 with fluent speech, no focal motor/sensory deficits EXTREMITIES: No lower extremity edema BREAST: No palpable masses or nodules in either right or left breasts. No palpable axillary supraclavicular or infraclavicular adenopathy no breast tenderness or nipple discharge. (exam performed in the presence of a chaperone)  LABORATORY DATA:  I have reviewed the data as listed CMP Latest Ref Rng & Units 08/23/2015 06/01/2015 05/04/2015  Glucose 70 - 140 mg/dl 101 102 97  BUN 7.0 - 26.0 mg/dL 17.3 14.3 12  Creatinine 0.6 - 1.1 mg/dL 1.0 0.9 0.82  Sodium 136 - 145 mEq/L 142 143 140  Potassium 3.5 - 5.1 mEq/L 3.7 4.3 3.9  Chloride 101 - 111 mmol/L - - 105  CO2 22 - 29 mEq/L 28 30(H) 30  Calcium 8.4 - 10.4 mg/dL 9.6 9.9 9.5  Total Protein 6.4 - 8.3 g/dL 6.7 6.9 7.3  Total Bilirubin 0.20 - 1.20 mg/dL 0.42 0.48 0.5  Alkaline Phos 40 - 150 U/L 96 100 97  AST 5 - 34 U/L '18 23 19  ' ALT 0 - 55 U/L '19 27 19    ' Lab Results  Component Value Date   WBC 3.8 (L) 08/23/2015   HGB 12.7 08/23/2015   HCT 37.8 08/23/2015   MCV 93.5 08/23/2015   PLT 201 08/23/2015   NEUTROABS 2.4 08/23/2015    ASSESSMENT & PLAN:  Breast cancer of upper-outer quadrant of left female breast Left breast invasive ductal carcinomagrade 3 ER 0% PR 0% HER-2 positive ratio 3.74, Ki-67 90%: 2.3 cm  mass by ultrasound clinical stage: T2, N0, M0 stage II A. Status post left lumpectomy 01/16/2015: Negative for in situ or invasive breast cancer 0/4 lymph nodes, complete pathologic response  Chemotherapy summary: Completed 6 cycles of neoadjuvant chemotherapy with Taxotere, carboplatin, Herceptin and Perjeta every 3 weeks started 08/28/15. Herceptin maintenance therapy completed 08/23/2015 Completed adjuvant radiation 05/13/2015  Breast Cancer Surveillance: 1. Breast exam12/11/2018: No palpable lumps or nodules, tenderness of the surgical site 2. Mammogram7/20/2020: Benign breast density category B  Ataxia: MRI brain  10/05/2017: Normal Memory loss: on REMEMBER study; patient was unable to tolerate the study medication.  In spite of that her memory has improved.  Return to clinic in 1 year for follow-up    No orders of the defined types were placed in this encounter.  The patient has a good understanding of the overall plan. she agrees with it. she will call with any problems that may develop before the next visit here.  Nicholas Lose, MD 09/30/2019  Julious Oka Dorshimer, am acting as scribe for Dr. Nicholas Lose.  I have reviewed the above documentation for accuracy and completeness, and I agree with the above.

## 2019-09-30 ENCOUNTER — Other Ambulatory Visit: Payer: Self-pay

## 2019-09-30 ENCOUNTER — Telehealth: Payer: Self-pay | Admitting: Hematology and Oncology

## 2019-09-30 ENCOUNTER — Inpatient Hospital Stay: Payer: BC Managed Care – PPO | Attending: Hematology and Oncology | Admitting: Hematology and Oncology

## 2019-09-30 DIAGNOSIS — Z171 Estrogen receptor negative status [ER-]: Secondary | ICD-10-CM | POA: Insufficient documentation

## 2019-09-30 DIAGNOSIS — Z79899 Other long term (current) drug therapy: Secondary | ICD-10-CM | POA: Diagnosis not present

## 2019-09-30 DIAGNOSIS — C50412 Malignant neoplasm of upper-outer quadrant of left female breast: Secondary | ICD-10-CM | POA: Diagnosis not present

## 2019-09-30 DIAGNOSIS — R270 Ataxia, unspecified: Secondary | ICD-10-CM | POA: Insufficient documentation

## 2019-09-30 MED ORDER — EZETIMIBE 10 MG PO TABS: 10.0000 mg | ORAL_TABLET | Freq: Every day | ORAL | Status: AC

## 2019-09-30 NOTE — Telephone Encounter (Signed)
I left a message regarding schedule  

## 2019-09-30 NOTE — Assessment & Plan Note (Signed)
Left breast invasive ductal carcinomagrade 3 ER 0% PR 0% HER-2 positive ratio 3.74, Ki-67 90%: 2.3 cm mass by ultrasound clinical stage: T2, N0, M0 stage II A. Status post left lumpectomy 01/16/2015: Negative for in situ or invasive breast cancer 0/4 lymph nodes, complete pathologic response  Chemotherapy summary: Completed 6 cycles of neoadjuvant chemotherapy with Taxotere, carboplatin, Herceptin and Perjeta every 3 weeks started 08/28/15. Herceptin maintenance therapy completed 08/23/2015 Completed adjuvant radiation 05/13/2015  Breast Cancer Surveillance: 1. Breast exam12/11/2018: No palpable lumps or nodules 2. Mammogram7/20/2020: Benign breast density category B  Ataxia: MRI brain 10/05/2017: Normal Memory loss: on REMEMBER study; patient was unable to tolerate the study medication.  In spite of that her memory has improved.  Return to clinic in 1 year for follow-up

## 2019-12-30 DIAGNOSIS — L570 Actinic keratosis: Secondary | ICD-10-CM | POA: Diagnosis not present

## 2019-12-30 DIAGNOSIS — L719 Rosacea, unspecified: Secondary | ICD-10-CM | POA: Diagnosis not present

## 2020-02-12 DIAGNOSIS — E7800 Pure hypercholesterolemia, unspecified: Secondary | ICD-10-CM | POA: Diagnosis not present

## 2020-02-12 DIAGNOSIS — I10 Essential (primary) hypertension: Secondary | ICD-10-CM | POA: Diagnosis not present

## 2020-02-12 DIAGNOSIS — Z0000 Encounter for general adult medical examination without abnormal findings: Secondary | ICD-10-CM | POA: Diagnosis not present

## 2020-04-05 ENCOUNTER — Other Ambulatory Visit: Payer: Self-pay | Admitting: Hematology and Oncology

## 2020-04-05 DIAGNOSIS — Z853 Personal history of malignant neoplasm of breast: Secondary | ICD-10-CM

## 2020-04-25 DIAGNOSIS — B0239 Other herpes zoster eye disease: Secondary | ICD-10-CM | POA: Diagnosis not present

## 2020-04-25 DIAGNOSIS — H2513 Age-related nuclear cataract, bilateral: Secondary | ICD-10-CM | POA: Diagnosis not present

## 2020-04-25 DIAGNOSIS — H0102B Squamous blepharitis left eye, upper and lower eyelids: Secondary | ICD-10-CM | POA: Diagnosis not present

## 2020-04-25 DIAGNOSIS — H0102A Squamous blepharitis right eye, upper and lower eyelids: Secondary | ICD-10-CM | POA: Diagnosis not present

## 2020-05-20 ENCOUNTER — Ambulatory Visit
Admission: RE | Admit: 2020-05-20 | Discharge: 2020-05-20 | Disposition: A | Discharge status: Home / Self Care | Payer: BC Managed Care – PPO | Source: Ambulatory Visit | Attending: Hematology and Oncology | Admitting: Hematology and Oncology

## 2020-05-20 ENCOUNTER — Other Ambulatory Visit: Payer: Self-pay

## 2020-05-20 DIAGNOSIS — Z853 Personal history of malignant neoplasm of breast: Secondary | ICD-10-CM

## 2020-05-20 DIAGNOSIS — R928 Other abnormal and inconclusive findings on diagnostic imaging of breast: Secondary | ICD-10-CM | POA: Diagnosis not present

## 2020-07-01 DIAGNOSIS — Z20822 Contact with and (suspected) exposure to covid-19: Secondary | ICD-10-CM | POA: Diagnosis not present

## 2020-08-18 DIAGNOSIS — Z23 Encounter for immunization: Secondary | ICD-10-CM | POA: Diagnosis not present

## 2020-08-22 DIAGNOSIS — Z1159 Encounter for screening for other viral diseases: Secondary | ICD-10-CM | POA: Diagnosis not present

## 2020-08-24 DIAGNOSIS — K648 Other hemorrhoids: Secondary | ICD-10-CM | POA: Diagnosis not present

## 2020-08-24 DIAGNOSIS — Z1211 Encounter for screening for malignant neoplasm of colon: Secondary | ICD-10-CM | POA: Diagnosis not present

## 2020-08-24 DIAGNOSIS — K5730 Diverticulosis of large intestine without perforation or abscess without bleeding: Secondary | ICD-10-CM | POA: Diagnosis not present

## 2020-08-24 DIAGNOSIS — D123 Benign neoplasm of transverse colon: Secondary | ICD-10-CM | POA: Diagnosis not present

## 2020-08-29 DIAGNOSIS — I10 Essential (primary) hypertension: Secondary | ICD-10-CM | POA: Diagnosis not present

## 2020-09-20 DIAGNOSIS — I10 Essential (primary) hypertension: Secondary | ICD-10-CM | POA: Diagnosis not present

## 2020-09-28 NOTE — Progress Notes (Signed)
Patient Care Team: Seward Carol, MD as PCP - General (Internal Medicine) [name], MD as Consulting Physician (Surgical Oncology) Thea Silversmith, MD as Consulting Physician (Radiation Oncology) Nicholas Lose, MD as Consulting Physician (Hematology and Oncology) Sylvan Cheese, NP as Nurse Practitioner (Hematology and Oncology) Ena Dawley, MD as Consulting Physician (Obstetrics and Gynecology) Jari Pigg, MD as Consulting Physician (Dermatology)  DIAGNOSIS:    ICD-10-CM   1. Malignant neoplasm of upper-outer quadrant of left breast in female, estrogen receptor negative ([place])  C50.412    Z17.1     SUMMARY OF ONCOLOGIC HISTORY: Oncology History  Breast cancer of upper-outer quadrant of left female breast ([place])  07/28/2014 Mammogram   Left breast: possible mass warranting further imaging   08/05/2014 Breast US   Left breast 10 o clock: 2.3 x 2 x 1.4 cm lobular hypoechoeic mass   08/05/2014 Initial Biopsy   Left breast core needle bx: Invasive Mammary Carcinoma, grade 3, ER- (0%), PR- (0%), HER2/neu positive (ratio 3.74), Ki 67: 97%   08/12/2014 Procedure   Genetic testing: BreastNext panel Cephus Shelling) revealed no clinically significant variant at ATM, BARD1, BRCA1, BRCA2, BRIP1, CDH1, CHEK2, MRE11A, MUTYH, NBN, NF1, PALB2, PTEN, RAD50, RAD51C, RAD51D, and TP53.   08/17/2014 Breast MRI   Left breast known malignancy upper inner quadrant 2.6 cm: Suspicious non-mass enhancement upper-inner quadrant right breast biopsy pending   08/17/2014 Clinical Stage   Stage IIA: T2 N0   08/27/2014 - 12/17/2014 Neo-Adjuvant Chemotherapy   Docetaxel, carboplatin, trastuzumab, and pertuzumab every 3 weeks x 6 cycles   12/27/2014 Breast MRI   Significant treatment response. Previously biopsied mass is no longer visible, residual clumped linearly oriented non-mass enhancement anterior to the biopsied mass.   01/07/2015 - 08/23/2015 Chemotherapy   Maintenence trastuzumab q 3  weeks to complete one year of therapy   01/18/2015 Definitive Surgery   Left lumpectomy/[facility]): negative for invasive and insitu cancer - complete pathologic response. 0/4 LN.   01/18/2015 Pathologic Stage   ypTx ypN0   03/29/2015 - 05/13/2015 Radiation Therapy   Adjuvant RT Pablo Ledger): Left breast 45 Gy over 25 fractions. Left breast boost 16 Gy over 8 fractions   09/16/2015 Survivorship   Survivorship visit completed and copy of care plan provided to patient     CHIEF COMPLIANT: Surveillance of left breast cancer  INTERVAL HISTORY: Julie [name] is a 64 y.o. with above-mentioned history of HER-2 positiveleftbreast cancer treated with neoadjuvant chemotherapy with complete pathologic response, lumpectomy, adjuvant Herceptin maintenance, radiation, and is currently on surveillance. Mammogram on 05/20/20 showed no evidence of malignancy bilaterally. She presents to the clinic today for annual follow-up.    ALLERGIES:  is allergic to codeine, sulfa antibiotics, zithromax [azithromycin], and penicillins.  MEDICATIONS:  Current Outpatient Medications  Medication Sig Dispense Refill  . acetaminophen (TYLENOL) 325 MG tablet Take 650 mg by mouth every 6 (six) hours as needed for mild pain.    Marland Kitchen buPROPion (WELLBUTRIN SR) 150 MG 12 hr tablet Take 150 mg by mouth every evening.     . ezetimibe (ZETIA) 10 MG tablet Take 1 tablet (10 mg total) by mouth daily.    . fluticasone (FLONASE) 50 MCG/ACT nasal spray Place into both nostrils daily as needed.    Marland Kitchen ibuprofen (ADVIL,MOTRIN) 200 MG tablet Take 400 mg by mouth 2 (two) times daily as needed.    Robb Matar 1 % CREA Apply once daily as needed  0   No current facility-administered medications for this visit.  Facility-Administered Medications Ordered in Other Visits  Medication Dose Route Frequency Provider Last Rate Last Admin  . Influenza vac split quadrivalent PF (FLUARIX) injection 0.5 mL  0.5 mL Intramuscular Tomorrow-1000  Nicholas Lose, MD        PHYSICAL EXAMINATION: ECOG PERFORMANCE STATUS: 1 - Symptomatic but completely ambulatory  Vitals:   09/29/20 1359  BP: 135/82  Pulse: 73  Resp: 18  Temp: (!) 97.1 F (36.2 C)  SpO2: 97%   Filed Weights   09/29/20 1359  Weight: 186 lb 14.4 oz (84.8 kg)    BREAST: No palpable masses or nodules in either right or left breasts. No palpable axillary supraclavicular or infraclavicular adenopathy no breast tenderness or nipple discharge. (exam performed in the presence of a chaperone)  LABORATORY DATA:  I have reviewed the data as listed CMP Latest Ref Rng & Units 08/23/2015 06/01/2015 05/04/2015  Glucose 70 - 140 mg/dl 101 102 97  BUN 7.0 - 26.0 mg/dL 17.3 14.3 12  Creatinine 0.6 - 1.1 mg/dL 1.0 0.9 0.82  Sodium 136 - 145 mEq/L 142 143 140  Potassium 3.5 - 5.1 mEq/L 3.7 4.3 3.9  Chloride 101 - 111 mmol/L - - 105  CO2 22 - 29 mEq/L 28 30(H) 30  Calcium 8.4 - 10.4 mg/dL 9.6 9.9 9.5  Total Protein 6.4 - 8.3 g/dL 6.7 6.9 7.3  Total Bilirubin 0.20 - 1.20 mg/dL 0.42 0.48 0.5  Alkaline Phos 40 - 150 U/L 96 100 97  AST 5 - 34 U/L '18 23 19  ' ALT 0 - 55 U/L '19 27 19    ' Lab Results  Component Value Date   WBC 3.8 (L) 08/23/2015   HGB 12.7 08/23/2015   HCT 37.8 08/23/2015   MCV 93.5 08/23/2015   PLT 201 08/23/2015   NEUTROABS 2.4 08/23/2015    ASSESSMENT & PLAN:  Breast cancer of upper-outer quadrant of left female breast Left breast invasive ductal carcinomagrade 3 ER 0% PR 0% HER-2 positive ratio 3.74, Ki-67 90%: 2.3 cm mass by ultrasound clinical stage: T2, N0, M0 stage II A. Status post left lumpectomy 01/16/2015: Negative for in situ or invasive breast cancer 0/4 lymph nodes, complete pathologic response  Chemotherapy summary: Completed 6 cycles of neoadjuvant chemotherapy with Taxotere, carboplatin, Herceptin and Perjeta every 3 weeks started 08/28/15. Herceptin maintenance therapy completed 08/23/2015 Completed adjuvant radiation  05/13/2015  Breast Cancer Surveillance: 1. Breast exam12/11/2019: No palpable lumps or nodules, tenderness of the surgical site 2. Mammogram7/23/2021: Benign breast density category B  Ataxia:MRI brain 10/05/2017: Normal Memory loss:on REMEMBER study;patient was unable to tolerate the study medication. In spite of that her memory has improved. She is very excited about the upcoming wedding of her daughter who is marrying an [place] boy.  Since she completed 5 years, we can have her follow with her primary care physician.  We will see her on an as-needed basis.  No orders of the defined types were placed in this encounter.  The patient has a good understanding of the overall plan. she agrees with it. she will call with any problems that may develop before the next visit here.  Total time spent: 20 mins including face to face time and time spent for planning, charting and coordination of care  Nicholas Lose, MD 09/29/2020  I, Cloyde Reams Dorshimer, am acting as scribe for Dr. Nicholas Lose.  I have reviewed the above documentation for accuracy and completeness, and I agree with the above.

## 2020-09-28 NOTE — Assessment & Plan Note (Signed)
Left breast invasive ductal carcinomagrade 3 ER 0% PR 0% HER-2 positive ratio 3.74, Ki-67 90%: 2.3 cm mass by ultrasound clinical stage: T2, N0, M0 stage II A. Status post left lumpectomy 01/16/2015: Negative for in situ or invasive breast cancer 0/4 lymph nodes, complete pathologic response  Chemotherapy summary: Completed 6 cycles of neoadjuvant chemotherapy with Taxotere, carboplatin, Herceptin and Perjeta every 3 weeks started 08/28/15. Herceptin maintenance therapy completed 08/23/2015 Completed adjuvant radiation 05/13/2015  Breast Cancer Surveillance: 1. Breast exam12/11/2019: No palpable lumps or nodules, tenderness of the surgical site 2. Mammogram7/23/2021: Benign breast density category B  Ataxia:MRI brain 10/05/2017: Normal Memory loss:on REMEMBER study;patient was unable to tolerate the study medication. In spite of that her memory has improved.  Return to clinic in 1 year for follow-up

## 2020-09-29 ENCOUNTER — Other Ambulatory Visit: Payer: Self-pay

## 2020-09-29 ENCOUNTER — Inpatient Hospital Stay: Payer: BC Managed Care – PPO | Attending: Hematology and Oncology | Admitting: Hematology and Oncology

## 2020-09-29 DIAGNOSIS — Z923 Personal history of irradiation: Secondary | ICD-10-CM | POA: Insufficient documentation

## 2020-09-29 DIAGNOSIS — C50412 Malignant neoplasm of upper-outer quadrant of left female breast: Secondary | ICD-10-CM | POA: Diagnosis not present

## 2020-09-29 DIAGNOSIS — Z853 Personal history of malignant neoplasm of breast: Secondary | ICD-10-CM | POA: Insufficient documentation

## 2020-09-29 DIAGNOSIS — Z79899 Other long term (current) drug therapy: Secondary | ICD-10-CM | POA: Diagnosis not present

## 2020-09-29 DIAGNOSIS — Z171 Estrogen receptor negative status [ER-]: Secondary | ICD-10-CM | POA: Diagnosis not present

## 2020-09-29 DIAGNOSIS — Z9221 Personal history of antineoplastic chemotherapy: Secondary | ICD-10-CM | POA: Insufficient documentation

## 2020-09-29 MED ORDER — LOSARTAN POTASSIUM-HCTZ 50-12.5 MG PO TABS: 1.0000 | ORAL_TABLET | Freq: Every day | ORAL | Status: AC

## 2020-10-04 ENCOUNTER — Telehealth: Payer: Self-pay | Admitting: Hematology and Oncology

## 2020-10-04 NOTE — Telephone Encounter (Signed)
Per 12/2 los, no changes made to pt schedule

## 2020-10-25 DIAGNOSIS — Z20822 Contact with and (suspected) exposure to covid-19: Secondary | ICD-10-CM | POA: Diagnosis not present

## 2020-11-21 ENCOUNTER — Other Ambulatory Visit: Payer: BC Managed Care – PPO

## 2020-11-21 DIAGNOSIS — Z20822 Contact with and (suspected) exposure to covid-19: Secondary | ICD-10-CM | POA: Diagnosis not present

## 2020-11-22 LAB — NOVEL CORONAVIRUS, NAA: SARS-CoV-2, NAA: NOT DETECTED

## 2020-11-22 LAB — SARS-COV-2, NAA 2 DAY TAT

## 2021-01-25 DIAGNOSIS — L719 Rosacea, unspecified: Secondary | ICD-10-CM | POA: Diagnosis not present

## 2021-01-25 DIAGNOSIS — L821 Other seborrheic keratosis: Secondary | ICD-10-CM | POA: Diagnosis not present

## 2021-01-25 DIAGNOSIS — Z872 Personal history of diseases of the skin and subcutaneous tissue: Secondary | ICD-10-CM | POA: Diagnosis not present

## 2021-01-30 ENCOUNTER — Ambulatory Visit: Payer: BC Managed Care – PPO

## 2021-04-06 ENCOUNTER — Other Ambulatory Visit: Payer: Self-pay | Admitting: Hematology and Oncology

## 2021-04-06 DIAGNOSIS — Z1231 Encounter for screening mammogram for malignant neoplasm of breast: Secondary | ICD-10-CM

## 2021-05-30 DIAGNOSIS — Z01419 Encounter for gynecological examination (general) (routine) without abnormal findings: Secondary | ICD-10-CM | POA: Diagnosis not present

## 2021-05-30 DIAGNOSIS — R87610 Atypical squamous cells of undetermined significance on cytologic smear of cervix (ASC-US): Secondary | ICD-10-CM | POA: Diagnosis not present

## 2021-05-30 DIAGNOSIS — Z124 Encounter for screening for malignant neoplasm of cervix: Secondary | ICD-10-CM | POA: Diagnosis not present

## 2021-05-31 DIAGNOSIS — M8589 Other specified disorders of bone density and structure, multiple sites: Secondary | ICD-10-CM | POA: Diagnosis not present

## 2021-06-01 ENCOUNTER — Other Ambulatory Visit: Payer: Self-pay

## 2021-06-01 ENCOUNTER — Ambulatory Visit
Admission: RE | Admit: 2021-06-01 | Discharge: 2021-06-01 | Disposition: A | Discharge status: Home / Self Care | Payer: BC Managed Care – PPO | Source: Ambulatory Visit | Attending: Hematology and Oncology | Admitting: Hematology and Oncology

## 2021-06-01 DIAGNOSIS — Z1231 Encounter for screening mammogram for malignant neoplasm of breast: Secondary | ICD-10-CM

## 2021-06-19 DIAGNOSIS — E7800 Pure hypercholesterolemia, unspecified: Secondary | ICD-10-CM | POA: Diagnosis not present

## 2021-06-19 DIAGNOSIS — M8580 Other specified disorders of bone density and structure, unspecified site: Secondary | ICD-10-CM | POA: Diagnosis not present

## 2021-06-19 DIAGNOSIS — I10 Essential (primary) hypertension: Secondary | ICD-10-CM | POA: Diagnosis not present

## 2021-06-19 DIAGNOSIS — Z0000 Encounter for general adult medical examination without abnormal findings: Secondary | ICD-10-CM | POA: Diagnosis not present

## 2021-06-26 ENCOUNTER — Telehealth: Payer: Self-pay | Admitting: *Deleted

## 2021-06-26 NOTE — Telephone Encounter (Signed)
WF [id number] Remember Study: Notified patient that the study has been unblinded and she was on the study drug donepezil. Patient was appreciative of the information. Thanked patient for her participation in this study.  Foye Spurling, BSN, RN Clinical Research Nurse 06/26/2021 12:35 PM

## 2021-09-07 DIAGNOSIS — E559 Vitamin D deficiency, unspecified: Secondary | ICD-10-CM | POA: Diagnosis not present

## 2021-09-29 ENCOUNTER — Other Ambulatory Visit: Payer: Self-pay | Admitting: Internal Medicine

## 2021-09-29 ENCOUNTER — Ambulatory Visit
Admission: RE | Admit: 2021-09-29 | Discharge: 2021-09-29 | Disposition: A | Discharge status: Home / Self Care | Payer: BC Managed Care – PPO | Source: Ambulatory Visit | Attending: Internal Medicine | Admitting: Internal Medicine

## 2021-09-29 DIAGNOSIS — M5416 Radiculopathy, lumbar region: Secondary | ICD-10-CM

## 2021-09-29 DIAGNOSIS — M545 Low back pain, unspecified: Secondary | ICD-10-CM | POA: Diagnosis not present

## 2021-09-29 DIAGNOSIS — M25559 Pain in unspecified hip: Secondary | ICD-10-CM

## 2021-09-29 DIAGNOSIS — M25552 Pain in left hip: Secondary | ICD-10-CM | POA: Diagnosis not present

## 2021-11-14 DIAGNOSIS — L719 Rosacea, unspecified: Secondary | ICD-10-CM | POA: Diagnosis not present

## 2021-11-14 DIAGNOSIS — D229 Melanocytic nevi, unspecified: Secondary | ICD-10-CM | POA: Diagnosis not present

## 2021-11-14 DIAGNOSIS — L821 Other seborrheic keratosis: Secondary | ICD-10-CM | POA: Diagnosis not present

## 2021-11-14 DIAGNOSIS — D485 Neoplasm of uncertain behavior of skin: Secondary | ICD-10-CM | POA: Diagnosis not present

## 2021-11-14 DIAGNOSIS — L814 Other melanin hyperpigmentation: Secondary | ICD-10-CM | POA: Diagnosis not present

## 2022-01-24 DIAGNOSIS — M25561 Pain in right knee: Secondary | ICD-10-CM | POA: Diagnosis not present

## 2022-02-28 DIAGNOSIS — M25561 Pain in right knee: Secondary | ICD-10-CM | POA: Diagnosis not present

## 2022-04-21 DIAGNOSIS — M1711 Unilateral primary osteoarthritis, right knee: Secondary | ICD-10-CM | POA: Diagnosis not present

## 2022-04-27 ENCOUNTER — Other Ambulatory Visit: Payer: Self-pay | Admitting: Hematology and Oncology

## 2022-04-27 DIAGNOSIS — Z1231 Encounter for screening mammogram for malignant neoplasm of breast: Secondary | ICD-10-CM

## 2022-05-14 DIAGNOSIS — M179 Osteoarthritis of knee, unspecified: Secondary | ICD-10-CM | POA: Diagnosis not present

## 2022-05-14 DIAGNOSIS — I10 Essential (primary) hypertension: Secondary | ICD-10-CM | POA: Diagnosis not present

## 2022-05-14 DIAGNOSIS — E7800 Pure hypercholesterolemia, unspecified: Secondary | ICD-10-CM | POA: Diagnosis not present

## 2022-05-14 DIAGNOSIS — E663 Overweight: Secondary | ICD-10-CM | POA: Diagnosis not present

## 2022-06-04 ENCOUNTER — Ambulatory Visit
Admission: RE | Admit: 2022-06-04 | Discharge: 2022-06-04 | Disposition: A | Discharge status: Home / Self Care | Payer: BC Managed Care – PPO | Source: Ambulatory Visit | Attending: Hematology and Oncology | Admitting: Hematology and Oncology

## 2022-06-04 DIAGNOSIS — Z1231 Encounter for screening mammogram for malignant neoplasm of breast: Secondary | ICD-10-CM

## 2022-06-04 DIAGNOSIS — M25661 Stiffness of right knee, not elsewhere classified: Secondary | ICD-10-CM | POA: Diagnosis not present

## 2022-06-04 DIAGNOSIS — R262 Difficulty in walking, not elsewhere classified: Secondary | ICD-10-CM | POA: Diagnosis not present

## 2022-06-04 DIAGNOSIS — M1731 Unilateral post-traumatic osteoarthritis, right knee: Secondary | ICD-10-CM | POA: Diagnosis not present

## 2022-06-11 DIAGNOSIS — Z01419 Encounter for gynecological examination (general) (routine) without abnormal findings: Secondary | ICD-10-CM | POA: Diagnosis not present

## 2022-07-10 DIAGNOSIS — E7800 Pure hypercholesterolemia, unspecified: Secondary | ICD-10-CM | POA: Diagnosis not present

## 2022-07-10 DIAGNOSIS — I10 Essential (primary) hypertension: Secondary | ICD-10-CM | POA: Diagnosis not present

## 2022-07-11 DIAGNOSIS — M1711 Unilateral primary osteoarthritis, right knee: Secondary | ICD-10-CM | POA: Diagnosis not present

## 2022-07-16 DIAGNOSIS — Z0000 Encounter for general adult medical examination without abnormal findings: Secondary | ICD-10-CM | POA: Diagnosis not present

## 2022-07-16 DIAGNOSIS — I10 Essential (primary) hypertension: Secondary | ICD-10-CM | POA: Diagnosis not present

## 2022-07-16 DIAGNOSIS — F419 Anxiety disorder, unspecified: Secondary | ICD-10-CM | POA: Diagnosis not present

## 2022-07-16 DIAGNOSIS — J029 Acute pharyngitis, unspecified: Secondary | ICD-10-CM | POA: Diagnosis not present

## 2022-07-16 DIAGNOSIS — Z23 Encounter for immunization: Secondary | ICD-10-CM | POA: Diagnosis not present

## 2022-07-16 DIAGNOSIS — M179 Osteoarthritis of knee, unspecified: Secondary | ICD-10-CM | POA: Diagnosis not present

## 2022-07-16 DIAGNOSIS — Z03818 Encounter for observation for suspected exposure to other biological agents ruled out: Secondary | ICD-10-CM | POA: Diagnosis not present

## 2022-07-26 DIAGNOSIS — M1711 Unilateral primary osteoarthritis, right knee: Secondary | ICD-10-CM | POA: Diagnosis not present

## 2022-07-26 DIAGNOSIS — G8918 Other acute postprocedural pain: Secondary | ICD-10-CM | POA: Diagnosis not present

## 2022-10-24 DIAGNOSIS — M1711 Unilateral primary osteoarthritis, right knee: Secondary | ICD-10-CM | POA: Diagnosis not present

## 2022-11-02 DIAGNOSIS — H0102B Squamous blepharitis left eye, upper and lower eyelids: Secondary | ICD-10-CM | POA: Diagnosis not present

## 2022-11-02 DIAGNOSIS — H0102A Squamous blepharitis right eye, upper and lower eyelids: Secondary | ICD-10-CM | POA: Diagnosis not present

## 2022-11-02 DIAGNOSIS — H2513 Age-related nuclear cataract, bilateral: Secondary | ICD-10-CM | POA: Diagnosis not present

## 2022-11-19 DIAGNOSIS — L719 Rosacea, unspecified: Secondary | ICD-10-CM | POA: Diagnosis not present

## 2022-11-19 DIAGNOSIS — D229 Melanocytic nevi, unspecified: Secondary | ICD-10-CM | POA: Diagnosis not present

## 2022-11-19 DIAGNOSIS — L814 Other melanin hyperpigmentation: Secondary | ICD-10-CM | POA: Diagnosis not present

## 2022-11-19 DIAGNOSIS — L821 Other seborrheic keratosis: Secondary | ICD-10-CM | POA: Diagnosis not present

## 2022-12-05 DIAGNOSIS — Z03818 Encounter for observation for suspected exposure to other biological agents ruled out: Secondary | ICD-10-CM | POA: Diagnosis not present

## 2022-12-05 DIAGNOSIS — R42 Dizziness and giddiness: Secondary | ICD-10-CM | POA: Diagnosis not present

## 2022-12-05 DIAGNOSIS — R6883 Chills (without fever): Secondary | ICD-10-CM | POA: Diagnosis not present

## 2022-12-05 DIAGNOSIS — R110 Nausea: Secondary | ICD-10-CM | POA: Diagnosis not present

## 2023-01-04 DIAGNOSIS — M5136 Other intervertebral disc degeneration, lumbar region: Secondary | ICD-10-CM | POA: Diagnosis not present

## 2023-01-04 DIAGNOSIS — M5416 Radiculopathy, lumbar region: Secondary | ICD-10-CM | POA: Diagnosis not present

## 2023-03-04 DIAGNOSIS — M1711 Unilateral primary osteoarthritis, right knee: Secondary | ICD-10-CM | POA: Diagnosis not present

## 2023-05-03 ENCOUNTER — Other Ambulatory Visit: Payer: Self-pay | Admitting: Hematology and Oncology

## 2023-05-03 DIAGNOSIS — Z1231 Encounter for screening mammogram for malignant neoplasm of breast: Secondary | ICD-10-CM

## 2023-06-07 ENCOUNTER — Ambulatory Visit
Admission: RE | Admit: 2023-06-07 | Discharge: 2023-06-07 | Disposition: A | Discharge status: Home / Self Care | Payer: BC Managed Care – PPO | Source: Ambulatory Visit | Attending: Hematology and Oncology | Admitting: Hematology and Oncology

## 2023-06-07 DIAGNOSIS — Z1231 Encounter for screening mammogram for malignant neoplasm of breast: Secondary | ICD-10-CM | POA: Diagnosis not present

## 2023-06-18 DIAGNOSIS — N9985 Post endometrial ablation syndrome: Secondary | ICD-10-CM | POA: Diagnosis not present

## 2023-06-18 DIAGNOSIS — M8580 Other specified disorders of bone density and structure, unspecified site: Secondary | ICD-10-CM | POA: Diagnosis not present

## 2023-06-18 DIAGNOSIS — C50919 Malignant neoplasm of unspecified site of unspecified female breast: Secondary | ICD-10-CM | POA: Diagnosis not present

## 2023-06-18 DIAGNOSIS — Z01419 Encounter for gynecological examination (general) (routine) without abnormal findings: Secondary | ICD-10-CM | POA: Diagnosis not present

## 2023-08-12 ENCOUNTER — Other Ambulatory Visit: Payer: Self-pay | Admitting: Internal Medicine

## 2023-08-12 DIAGNOSIS — E663 Overweight: Secondary | ICD-10-CM | POA: Diagnosis not present

## 2023-08-12 DIAGNOSIS — Z0000 Encounter for general adult medical examination without abnormal findings: Secondary | ICD-10-CM | POA: Diagnosis not present

## 2023-08-12 DIAGNOSIS — Z853 Personal history of malignant neoplasm of breast: Secondary | ICD-10-CM | POA: Diagnosis not present

## 2023-08-12 DIAGNOSIS — E7800 Pure hypercholesterolemia, unspecified: Secondary | ICD-10-CM | POA: Diagnosis not present

## 2023-08-12 DIAGNOSIS — Z23 Encounter for immunization: Secondary | ICD-10-CM | POA: Diagnosis not present

## 2023-08-12 DIAGNOSIS — I10 Essential (primary) hypertension: Secondary | ICD-10-CM | POA: Diagnosis not present

## 2023-08-12 DIAGNOSIS — Z1231 Encounter for screening mammogram for malignant neoplasm of breast: Secondary | ICD-10-CM

## 2023-08-29 ENCOUNTER — Other Ambulatory Visit: Payer: Self-pay | Admitting: Hematology and Oncology

## 2023-08-29 DIAGNOSIS — Z1231 Encounter for screening mammogram for malignant neoplasm of breast: Secondary | ICD-10-CM

## 2024-06-07 ENCOUNTER — Encounter: Payer: Self-pay | Admitting: Hematology and Oncology

## 2024-06-08 ENCOUNTER — Ambulatory Visit
Admission: RE | Admit: 2024-06-08 | Discharge: 2024-06-08 | Disposition: A | Discharge status: Home / Self Care | Payer: BC Managed Care – PPO | Source: Ambulatory Visit | Attending: Hematology and Oncology | Admitting: Hematology and Oncology

## 2024-06-08 ENCOUNTER — Encounter: Payer: Self-pay | Admitting: Hematology and Oncology

## 2024-06-08 DIAGNOSIS — Z1231 Encounter for screening mammogram for malignant neoplasm of breast: Secondary | ICD-10-CM

## 2024-06-10 ENCOUNTER — Other Ambulatory Visit: Payer: Self-pay | Admitting: Hematology and Oncology

## 2024-06-10 DIAGNOSIS — R928 Other abnormal and inconclusive findings on diagnostic imaging of breast: Secondary | ICD-10-CM

## 2024-06-23 ENCOUNTER — Ambulatory Visit
Admission: RE | Admit: 2024-06-23 | Discharge: 2024-06-23 | Disposition: A | Discharge status: Home / Self Care | Source: Ambulatory Visit | Attending: Hematology and Oncology | Admitting: Hematology and Oncology

## 2024-06-23 DIAGNOSIS — R928 Other abnormal and inconclusive findings on diagnostic imaging of breast: Secondary | ICD-10-CM

## 2024-11-25 ENCOUNTER — Encounter: Payer: Self-pay | Admitting: Hematology and Oncology

## 2024-12-08 ENCOUNTER — Ambulatory Visit: Admitting: Orthopedic Surgery

## 2025-04-07 ENCOUNTER — Ambulatory Visit: Admitting: Neurology
